# Patient Record
Sex: Female | Born: 1955 | Race: White | Hispanic: No | Marital: Single
Health system: Southern US, Community
[De-identification: ages and names within clinical notes are randomized; demographics above are authoritative.]

## PROBLEM LIST (undated history)

## (undated) DIAGNOSIS — I5181 Takotsubo syndrome: Secondary | ICD-10-CM

## (undated) DIAGNOSIS — E785 Hyperlipidemia, unspecified: Secondary | ICD-10-CM

## (undated) DIAGNOSIS — C3410 Malignant neoplasm of upper lobe, unspecified bronchus or lung: Secondary | ICD-10-CM

## (undated) DIAGNOSIS — Z8619 Personal history of other infectious and parasitic diseases: Secondary | ICD-10-CM

## (undated) DIAGNOSIS — J439 Emphysema, unspecified: Secondary | ICD-10-CM

## (undated) DIAGNOSIS — J189 Pneumonia, unspecified organism: Secondary | ICD-10-CM

## (undated) DIAGNOSIS — I428 Other cardiomyopathies: Secondary | ICD-10-CM

## (undated) DIAGNOSIS — R636 Underweight: Secondary | ICD-10-CM

## (undated) DIAGNOSIS — Z8709 Personal history of other diseases of the respiratory system: Secondary | ICD-10-CM

## (undated) DIAGNOSIS — J449 Chronic obstructive pulmonary disease, unspecified: Secondary | ICD-10-CM

## (undated) DIAGNOSIS — M810 Age-related osteoporosis without current pathological fracture: Secondary | ICD-10-CM

## (undated) HISTORY — DX: Other cardiomyopathies: I42.8

## (undated) HISTORY — DX: Malignant neoplasm of upper lobe, unspecified bronchus or lung: C34.10

## (undated) HISTORY — DX: Hyperlipidemia, unspecified: E78.5

---

## 2015-11-01 ENCOUNTER — Other Ambulatory Visit: Payer: Self-pay | Admitting: Obstetrics & Gynecology

## 2015-11-01 DIAGNOSIS — E2839 Other primary ovarian failure: Secondary | ICD-10-CM

## 2015-11-26 ENCOUNTER — Ambulatory Visit
Admission: RE | Admit: 2015-11-26 | Discharge: 2015-11-26 | Disposition: A | Discharge status: Home / Self Care | Payer: BLUE CROSS/BLUE SHIELD | Source: Ambulatory Visit | Attending: Obstetrics & Gynecology | Admitting: Obstetrics & Gynecology

## 2015-11-26 DIAGNOSIS — E2839 Other primary ovarian failure: Secondary | ICD-10-CM

## 2020-12-10 DIAGNOSIS — Z1231 Encounter for screening mammogram for malignant neoplasm of breast: Secondary | ICD-10-CM | POA: Diagnosis not present

## 2020-12-13 DIAGNOSIS — Z681 Body mass index (BMI) 19 or less, adult: Secondary | ICD-10-CM | POA: Diagnosis not present

## 2020-12-13 DIAGNOSIS — R5383 Other fatigue: Secondary | ICD-10-CM | POA: Diagnosis not present

## 2020-12-13 DIAGNOSIS — Z01419 Encounter for gynecological examination (general) (routine) without abnormal findings: Secondary | ICD-10-CM | POA: Diagnosis not present

## 2020-12-13 DIAGNOSIS — E7800 Pure hypercholesterolemia, unspecified: Secondary | ICD-10-CM | POA: Diagnosis not present

## 2020-12-13 DIAGNOSIS — Z124 Encounter for screening for malignant neoplasm of cervix: Secondary | ICD-10-CM | POA: Diagnosis not present

## 2020-12-27 DIAGNOSIS — Z1211 Encounter for screening for malignant neoplasm of colon: Secondary | ICD-10-CM | POA: Diagnosis not present

## 2021-01-01 LAB — COLOGUARD: COLOGUARD: NEGATIVE

## 2021-11-28 ENCOUNTER — Observation Stay (HOSPITAL_COMMUNITY): Payer: Medicare HMO

## 2021-11-28 ENCOUNTER — Other Ambulatory Visit: Payer: Self-pay

## 2021-11-28 ENCOUNTER — Emergency Department (HOSPITAL_COMMUNITY): Payer: Medicare HMO

## 2021-11-28 ENCOUNTER — Inpatient Hospital Stay (HOSPITAL_COMMUNITY)
Admission: EM | Admit: 2021-11-28 | Discharge: 2021-12-18 | DRG: 870 | Disposition: A | Discharge status: Rehabilitation Facility | Payer: Medicare HMO | Attending: Internal Medicine | Admitting: Internal Medicine

## 2021-11-28 ENCOUNTER — Encounter (HOSPITAL_COMMUNITY): Payer: Self-pay | Admitting: Emergency Medicine

## 2021-11-28 DIAGNOSIS — E440 Moderate protein-calorie malnutrition: Secondary | ICD-10-CM | POA: Insufficient documentation

## 2021-11-28 DIAGNOSIS — S0101XA Laceration without foreign body of scalp, initial encounter: Secondary | ICD-10-CM | POA: Diagnosis present

## 2021-11-28 DIAGNOSIS — L89151 Pressure ulcer of sacral region, stage 1: Secondary | ICD-10-CM | POA: Diagnosis not present

## 2021-11-28 DIAGNOSIS — Z79899 Other long term (current) drug therapy: Secondary | ICD-10-CM

## 2021-11-28 DIAGNOSIS — J439 Emphysema, unspecified: Secondary | ICD-10-CM | POA: Diagnosis present

## 2021-11-28 DIAGNOSIS — R197 Diarrhea, unspecified: Secondary | ICD-10-CM | POA: Diagnosis not present

## 2021-11-28 DIAGNOSIS — R413 Other amnesia: Secondary | ICD-10-CM | POA: Diagnosis not present

## 2021-11-28 DIAGNOSIS — E874 Mixed disorder of acid-base balance: Secondary | ICD-10-CM | POA: Diagnosis present

## 2021-11-28 DIAGNOSIS — Z978 Presence of other specified devices: Secondary | ICD-10-CM

## 2021-11-28 DIAGNOSIS — Z9181 History of falling: Secondary | ICD-10-CM

## 2021-11-28 DIAGNOSIS — E871 Hypo-osmolality and hyponatremia: Secondary | ICD-10-CM | POA: Diagnosis present

## 2021-11-28 DIAGNOSIS — F17210 Nicotine dependence, cigarettes, uncomplicated: Secondary | ICD-10-CM | POA: Diagnosis present

## 2021-11-28 DIAGNOSIS — I5021 Acute systolic (congestive) heart failure: Secondary | ICD-10-CM | POA: Diagnosis present

## 2021-11-28 DIAGNOSIS — J9691 Respiratory failure, unspecified with hypoxia: Secondary | ICD-10-CM

## 2021-11-28 DIAGNOSIS — G9341 Metabolic encephalopathy: Secondary | ICD-10-CM | POA: Diagnosis present

## 2021-11-28 DIAGNOSIS — J9601 Acute respiratory failure with hypoxia: Secondary | ICD-10-CM

## 2021-11-28 DIAGNOSIS — R531 Weakness: Secondary | ICD-10-CM | POA: Diagnosis not present

## 2021-11-28 DIAGNOSIS — R000 Tachycardia, unspecified: Secondary | ICD-10-CM | POA: Diagnosis not present

## 2021-11-28 DIAGNOSIS — R7989 Other specified abnormal findings of blood chemistry: Secondary | ICD-10-CM | POA: Diagnosis not present

## 2021-11-28 DIAGNOSIS — I517 Cardiomegaly: Secondary | ICD-10-CM | POA: Diagnosis not present

## 2021-11-28 DIAGNOSIS — R570 Cardiogenic shock: Secondary | ICD-10-CM | POA: Diagnosis not present

## 2021-11-28 DIAGNOSIS — I248 Other forms of acute ischemic heart disease: Secondary | ICD-10-CM | POA: Diagnosis present

## 2021-11-28 DIAGNOSIS — A481 Legionnaires' disease: Secondary | ICD-10-CM | POA: Diagnosis present

## 2021-11-28 DIAGNOSIS — R0600 Dyspnea, unspecified: Secondary | ICD-10-CM | POA: Diagnosis not present

## 2021-11-28 DIAGNOSIS — J811 Chronic pulmonary edema: Secondary | ICD-10-CM

## 2021-11-28 DIAGNOSIS — R54 Age-related physical debility: Secondary | ICD-10-CM | POA: Diagnosis present

## 2021-11-28 DIAGNOSIS — R0689 Other abnormalities of breathing: Secondary | ICD-10-CM | POA: Diagnosis not present

## 2021-11-28 DIAGNOSIS — Z885 Allergy status to narcotic agent status: Secondary | ICD-10-CM

## 2021-11-28 DIAGNOSIS — W06XXXA Fall from bed, initial encounter: Secondary | ICD-10-CM | POA: Diagnosis present

## 2021-11-28 DIAGNOSIS — R58 Hemorrhage, not elsewhere classified: Secondary | ICD-10-CM | POA: Diagnosis not present

## 2021-11-28 DIAGNOSIS — Z681 Body mass index (BMI) 19 or less, adult: Secondary | ICD-10-CM

## 2021-11-28 DIAGNOSIS — R051 Acute cough: Secondary | ICD-10-CM

## 2021-11-28 DIAGNOSIS — K7010 Alcoholic hepatitis without ascites: Secondary | ICD-10-CM | POA: Diagnosis present

## 2021-11-28 DIAGNOSIS — Z452 Encounter for adjustment and management of vascular access device: Secondary | ICD-10-CM

## 2021-11-28 DIAGNOSIS — J80 Acute respiratory distress syndrome: Secondary | ICD-10-CM | POA: Diagnosis not present

## 2021-11-28 DIAGNOSIS — R739 Hyperglycemia, unspecified: Secondary | ICD-10-CM | POA: Diagnosis present

## 2021-11-28 DIAGNOSIS — J9600 Acute respiratory failure, unspecified whether with hypoxia or hypercapnia: Secondary | ICD-10-CM

## 2021-11-28 DIAGNOSIS — D509 Iron deficiency anemia, unspecified: Secondary | ICD-10-CM | POA: Diagnosis present

## 2021-11-28 DIAGNOSIS — E860 Dehydration: Secondary | ICD-10-CM | POA: Diagnosis present

## 2021-11-28 DIAGNOSIS — R195 Other fecal abnormalities: Secondary | ICD-10-CM | POA: Diagnosis present

## 2021-11-28 DIAGNOSIS — A419 Sepsis, unspecified organism: Principal | ICD-10-CM

## 2021-11-28 DIAGNOSIS — L8990 Pressure ulcer of unspecified site, unspecified stage: Secondary | ICD-10-CM | POA: Insufficient documentation

## 2021-11-28 DIAGNOSIS — R059 Cough, unspecified: Secondary | ICD-10-CM | POA: Diagnosis not present

## 2021-11-28 DIAGNOSIS — R823 Hemoglobinuria: Secondary | ICD-10-CM | POA: Diagnosis present

## 2021-11-28 DIAGNOSIS — Z4659 Encounter for fitting and adjustment of other gastrointestinal appliance and device: Secondary | ICD-10-CM

## 2021-11-28 DIAGNOSIS — Z743 Need for continuous supervision: Secondary | ICD-10-CM | POA: Diagnosis not present

## 2021-11-28 DIAGNOSIS — N179 Acute kidney failure, unspecified: Secondary | ICD-10-CM | POA: Diagnosis present

## 2021-11-28 DIAGNOSIS — R1310 Dysphagia, unspecified: Secondary | ICD-10-CM | POA: Diagnosis not present

## 2021-11-28 DIAGNOSIS — Y92003 Bedroom of unspecified non-institutional (private) residence as the place of occurrence of the external cause: Secondary | ICD-10-CM

## 2021-11-28 DIAGNOSIS — J189 Pneumonia, unspecified organism: Secondary | ICD-10-CM

## 2021-11-28 DIAGNOSIS — R0602 Shortness of breath: Secondary | ICD-10-CM | POA: Diagnosis not present

## 2021-11-28 DIAGNOSIS — F1010 Alcohol abuse, uncomplicated: Secondary | ICD-10-CM | POA: Diagnosis present

## 2021-11-28 DIAGNOSIS — E876 Hypokalemia: Secondary | ICD-10-CM | POA: Diagnosis present

## 2021-11-28 DIAGNOSIS — Z20822 Contact with and (suspected) exposure to covid-19: Secondary | ICD-10-CM | POA: Diagnosis present

## 2021-11-28 DIAGNOSIS — I5181 Takotsubo syndrome: Secondary | ICD-10-CM

## 2021-11-28 DIAGNOSIS — S0181XA Laceration without foreign body of other part of head, initial encounter: Secondary | ICD-10-CM | POA: Diagnosis not present

## 2021-11-28 DIAGNOSIS — E43 Unspecified severe protein-calorie malnutrition: Secondary | ICD-10-CM | POA: Insufficient documentation

## 2021-11-28 DIAGNOSIS — D539 Nutritional anemia, unspecified: Secondary | ICD-10-CM | POA: Diagnosis present

## 2021-11-28 DIAGNOSIS — Z720 Tobacco use: Secondary | ICD-10-CM

## 2021-11-28 DIAGNOSIS — R6521 Severe sepsis with septic shock: Secondary | ICD-10-CM | POA: Diagnosis not present

## 2021-11-28 DIAGNOSIS — M810 Age-related osteoporosis without current pathological fracture: Secondary | ICD-10-CM | POA: Diagnosis present

## 2021-11-28 HISTORY — DX: Chronic obstructive pulmonary disease, unspecified: J44.9

## 2021-11-28 HISTORY — DX: Underweight: R63.6

## 2021-11-28 HISTORY — DX: Age-related osteoporosis without current pathological fracture: M81.0

## 2021-11-28 LAB — PROTIME-INR
INR: 1 (ref 0.8–1.2)
Prothrombin Time: 12.6 seconds (ref 11.4–15.2)

## 2021-11-28 LAB — CBC WITH DIFFERENTIAL/PLATELET
Abs Immature Granulocytes: 0.22 10*3/uL — ABNORMAL HIGH (ref 0.00–0.07)
Basophils Absolute: 0.1 10*3/uL (ref 0.0–0.1)
Basophils Relative: 0 %
Eosinophils Absolute: 0 10*3/uL (ref 0.0–0.5)
Eosinophils Relative: 0 %
HCT: 38.1 % (ref 36.0–46.0)
Hemoglobin: 13.7 g/dL (ref 12.0–15.0)
Immature Granulocytes: 2 %
Lymphocytes Relative: 2 %
Lymphs Abs: 0.3 10*3/uL — ABNORMAL LOW (ref 0.7–4.0)
MCH: 36.3 pg — ABNORMAL HIGH (ref 26.0–34.0)
MCHC: 36 g/dL (ref 30.0–36.0)
MCV: 101.1 fL — ABNORMAL HIGH (ref 80.0–100.0)
Monocytes Absolute: 0.5 10*3/uL (ref 0.1–1.0)
Monocytes Relative: 4 %
Neutro Abs: 12.3 10*3/uL — ABNORMAL HIGH (ref 1.7–7.7)
Neutrophils Relative %: 92 %
Platelets: 177 10*3/uL (ref 150–400)
RBC: 3.77 MIL/uL — ABNORMAL LOW (ref 3.87–5.11)
RDW: 12.8 % (ref 11.5–15.5)
WBC Morphology: INCREASED
WBC: 13.3 10*3/uL — ABNORMAL HIGH (ref 4.0–10.5)
nRBC: 0 % (ref 0.0–0.2)

## 2021-11-28 LAB — URINALYSIS, ROUTINE W REFLEX MICROSCOPIC
Bilirubin Urine: NEGATIVE
Glucose, UA: NEGATIVE mg/dL
Ketones, ur: 20 mg/dL — AB
Leukocytes,Ua: NEGATIVE
Nitrite: NEGATIVE
Protein, ur: 100 mg/dL — AB
Specific Gravity, Urine: 1.023 (ref 1.005–1.030)
pH: 6 (ref 5.0–8.0)

## 2021-11-28 LAB — BASIC METABOLIC PANEL
Anion gap: 15 (ref 5–15)
BUN: 11 mg/dL (ref 8–23)
CO2: 20 mmol/L — ABNORMAL LOW (ref 22–32)
Calcium: 7.7 mg/dL — ABNORMAL LOW (ref 8.9–10.3)
Chloride: 93 mmol/L — ABNORMAL LOW (ref 98–111)
Creatinine, Ser: 0.6 mg/dL (ref 0.44–1.00)
GFR, Estimated: >60 mL/min (ref >60)
Glucose, Bld: 119 mg/dL — ABNORMAL HIGH (ref 70–99)
Potassium: 2.7 mmol/L — CL (ref 3.5–5.1)
Sodium: 128 mmol/L — ABNORMAL LOW (ref 135–145)

## 2021-11-28 LAB — POC OCCULT BLOOD, ED: Fecal Occult Bld: POSITIVE — AB

## 2021-11-28 LAB — COMPREHENSIVE METABOLIC PANEL
ALT: 71 U/L — ABNORMAL HIGH (ref 0–44)
AST: 210 U/L — ABNORMAL HIGH (ref 15–41)
Albumin: 2.5 g/dL — ABNORMAL LOW (ref 3.5–5.0)
Alkaline Phosphatase: 49 U/L (ref 38–126)
Anion gap: 13 (ref 5–15)
BUN: 16 mg/dL (ref 8–23)
CO2: 22 mmol/L (ref 22–32)
Calcium: 8.2 mg/dL — ABNORMAL LOW (ref 8.9–10.3)
Chloride: 91 mmol/L — ABNORMAL LOW (ref 98–111)
Creatinine, Ser: 0.74 mg/dL (ref 0.44–1.00)
GFR, Estimated: >60 mL/min (ref >60)
Glucose, Bld: 159 mg/dL — ABNORMAL HIGH (ref 70–99)
Potassium: 3.5 mmol/L (ref 3.5–5.1)
Sodium: 126 mmol/L — ABNORMAL LOW (ref 135–145)
Total Bilirubin: 0.4 mg/dL (ref 0.3–1.2)
Total Protein: 6.1 g/dL — ABNORMAL LOW (ref 6.5–8.1)

## 2021-11-28 LAB — I-STAT VENOUS BLOOD GAS, ED
Acid-base deficit: 1 mmol/L (ref 0.0–2.0)
Bicarbonate: 22.5 mmol/L (ref 20.0–28.0)
Calcium, Ion: 1.01 mmol/L — ABNORMAL LOW (ref 1.15–1.40)
HCT: 41 % (ref 36.0–46.0)
Hemoglobin: 13.9 g/dL (ref 12.0–15.0)
O2 Saturation: 46 %
Potassium: 3.4 mmol/L — ABNORMAL LOW (ref 3.5–5.1)
Sodium: 125 mmol/L — ABNORMAL LOW (ref 135–145)
TCO2: 23 mmol/L (ref 22–32)
pCO2, Ven: 32.6 mmHg — ABNORMAL LOW (ref 44.0–60.0)
pH, Ven: 7.446 — ABNORMAL HIGH (ref 7.250–7.430)
pO2, Ven: 24 mmHg — CL (ref 32.0–45.0)

## 2021-11-28 LAB — TYPE AND SCREEN
ABO/RH(D): O POS
Antibody Screen: NEGATIVE

## 2021-11-28 LAB — GAMMA GT: GGT: 41 U/L (ref 7–50)

## 2021-11-28 LAB — HEPATITIS B SURFACE ANTIBODY,QUALITATIVE: Hep B S Ab: NONREACTIVE

## 2021-11-28 LAB — MAGNESIUM: Magnesium: 2.3 mg/dL (ref 1.7–2.4)

## 2021-11-28 LAB — CK: Total CK: 2245 U/L — ABNORMAL HIGH (ref 38–234)

## 2021-11-28 LAB — RESP PANEL BY RT-PCR (FLU A&B, COVID) ARPGX2
Influenza A by PCR: NEGATIVE
Influenza B by PCR: NEGATIVE
SARS Coronavirus 2 by RT PCR: NEGATIVE

## 2021-11-28 LAB — ABO/RH: ABO/RH(D): O POS

## 2021-11-28 LAB — HEMOGLOBIN A1C
Hgb A1c MFr Bld: 5.5 % (ref 4.8–5.6)
Mean Plasma Glucose: 111.15 mg/dL

## 2021-11-28 LAB — MRSA NEXT GEN BY PCR, NASAL: MRSA by PCR Next Gen: NOT DETECTED

## 2021-11-28 LAB — ETHANOL: Alcohol, Ethyl (B): <10 mg/dL (ref <10)

## 2021-11-28 LAB — HIV ANTIBODY (ROUTINE TESTING W REFLEX): HIV Screen 4th Generation wRfx: NONREACTIVE

## 2021-11-28 LAB — HEPATITIS B SURFACE ANTIGEN: Hepatitis B Surface Ag: NONREACTIVE

## 2021-11-28 LAB — HEPATITIS B CORE ANTIBODY, IGM: Hep B C IgM: NONREACTIVE

## 2021-11-28 LAB — LACTIC ACID, PLASMA
Lactic Acid, Venous: 3.1 mmol/L (ref 0.5–1.9)
Lactic Acid, Venous: 1.7 mmol/L (ref 0.5–1.9)

## 2021-11-28 LAB — APTT: aPTT: 32 seconds (ref 24–36)

## 2021-11-28 MED ORDER — THIAMINE HCL 100 MG PO TABS
100.0000 mg | ORAL_TABLET | Freq: Every day | ORAL | Status: DC
  Administered 2021-11-28 – 2021-11-29 (×2): 100 mg via ORAL
  Filled 2021-11-28 (×2): qty 1

## 2021-11-28 MED ORDER — LACTATED RINGERS IV BOLUS
500.0000 mL | Freq: Once | INTRAVENOUS | Status: AC
Start: 2021-11-28 — End: 2021-11-28
  Administered 2021-11-28: 500 mL via INTRAVENOUS

## 2021-11-28 MED ORDER — IPRATROPIUM-ALBUTEROL 0.5-2.5 (3) MG/3ML IN SOLN
3.0000 mL | Freq: Three times a day (TID) | RESPIRATORY_TRACT | Status: DC
  Administered 2021-11-29 – 2021-12-12 (×38): 3 mL via RESPIRATORY_TRACT
  Filled 2021-11-28 (×37): qty 3

## 2021-11-28 MED ORDER — FOLIC ACID 1 MG PO TABS
1.0000 mg | ORAL_TABLET | Freq: Every day | ORAL | Status: DC
  Administered 2021-11-28 – 2021-11-30 (×3): 1 mg via ORAL
  Filled 2021-11-28 (×3): qty 1

## 2021-11-28 MED ORDER — LACTATED RINGERS IV BOLUS
1000.0000 mL | Freq: Once | INTRAVENOUS | Status: AC
Start: 2021-11-28 — End: 2021-11-28
  Administered 2021-11-28: 1000 mL via INTRAVENOUS

## 2021-11-28 MED ORDER — CHLORHEXIDINE GLUCONATE CLOTH 2 % EX PADS
6.0000 | MEDICATED_PAD | Freq: Every day | CUTANEOUS | Status: DC
  Administered 2021-11-29 – 2021-12-18 (×21): 6 via TOPICAL

## 2021-11-28 MED ORDER — IPRATROPIUM-ALBUTEROL 0.5-2.5 (3) MG/3ML IN SOLN
3.0000 mL | RESPIRATORY_TRACT | Status: DC | PRN
  Administered 2021-12-09: 3 mL via RESPIRATORY_TRACT
  Filled 2021-11-28 (×3): qty 3

## 2021-11-28 MED ORDER — LACTATED RINGERS IV BOLUS
1000.0000 mL | Freq: Once | INTRAVENOUS | Status: AC
  Administered 2021-11-28: 1000 mL via INTRAVENOUS

## 2021-11-28 MED ORDER — ADULT MULTIVITAMIN W/MINERALS CH
1.0000 | ORAL_TABLET | Freq: Every day | ORAL | Status: DC
  Administered 2021-11-28 – 2021-11-30 (×3): 1 via ORAL
  Filled 2021-11-28 (×3): qty 1

## 2021-11-28 MED ORDER — IPRATROPIUM-ALBUTEROL 0.5-2.5 (3) MG/3ML IN SOLN
RESPIRATORY_TRACT | Status: AC
  Administered 2021-11-28: 3 mL via RESPIRATORY_TRACT
  Filled 2021-11-28: qty 3

## 2021-11-28 MED ORDER — POTASSIUM CHLORIDE 10 MEQ/100ML IV SOLN
10.0000 meq | INTRAVENOUS | Status: AC
  Administered 2021-11-28 – 2021-11-29 (×4): 10 meq via INTRAVENOUS
  Filled 2021-11-28 (×6): qty 100

## 2021-11-28 MED ORDER — VANCOMYCIN HCL IN DEXTROSE 1-5 GM/200ML-% IV SOLN
1000.0000 mg | Freq: Once | INTRAVENOUS | Status: AC
  Administered 2021-11-28: 1000 mg via INTRAVENOUS
  Filled 2021-11-28: qty 200

## 2021-11-28 MED ORDER — NICOTINE 21 MG/24HR TD PT24
21.0000 mg | MEDICATED_PATCH | Freq: Every day | TRANSDERMAL | Status: DC
  Administered 2021-11-28 – 2021-12-18 (×21): 21 mg via TRANSDERMAL
  Filled 2021-11-28 (×21): qty 1

## 2021-11-28 MED ORDER — ORAL CARE MOUTH RINSE
15.0000 mL | Freq: Two times a day (BID) | OROMUCOSAL | Status: DC
  Administered 2021-11-29 – 2021-11-30 (×4): 15 mL via OROMUCOSAL

## 2021-11-28 MED ORDER — SODIUM CHLORIDE 0.9 % IV SOLN
500.0000 mg | INTRAVENOUS | Status: DC
  Administered 2021-11-28 – 2021-12-01 (×4): 500 mg via INTRAVENOUS
  Filled 2021-11-28 (×5): qty 5

## 2021-11-28 MED ORDER — POTASSIUM CHLORIDE 20 MEQ PO PACK
40.0000 meq | PACK | Freq: Three times a day (TID) | ORAL | Status: AC
  Administered 2021-11-28 – 2021-11-29 (×3): 40 meq via ORAL
  Filled 2021-11-28 (×3): qty 2

## 2021-11-28 MED ORDER — SODIUM CHLORIDE 0.9 % IV SOLN
2.0000 g | INTRAVENOUS | Status: DC
  Administered 2021-11-28 – 2021-12-01 (×4): 2 g via INTRAVENOUS
  Filled 2021-11-28 (×4): qty 20

## 2021-11-28 MED ORDER — CHLORHEXIDINE GLUCONATE 0.12 % MT SOLN
15.0000 mL | Freq: Two times a day (BID) | OROMUCOSAL | Status: DC
  Administered 2021-11-29 – 2021-12-03 (×7): 15 mL via OROMUCOSAL
  Filled 2021-11-28 (×6): qty 15

## 2021-11-28 MED ORDER — SODIUM CHLORIDE 0.9 % IV SOLN
2.0000 g | Freq: Once | INTRAVENOUS | Status: AC
  Administered 2021-11-28: 2 g via INTRAVENOUS
  Filled 2021-11-28: qty 2

## 2021-11-28 MED ORDER — ACETAMINOPHEN 325 MG PO TABS
650.0000 mg | ORAL_TABLET | Freq: Once | ORAL | Status: AC
  Administered 2021-11-28: 650 mg via ORAL
  Filled 2021-11-28: qty 2

## 2021-11-28 MED ORDER — ENOXAPARIN SODIUM 30 MG/0.3ML IJ SOSY
30.0000 mg | PREFILLED_SYRINGE | INTRAMUSCULAR | Status: DC
  Administered 2021-11-28 – 2021-12-02 (×5): 30 mg via SUBCUTANEOUS
  Filled 2021-11-28 (×5): qty 0.3

## 2021-11-28 MED ORDER — ACETAMINOPHEN 325 MG PO TABS
650.0000 mg | ORAL_TABLET | Freq: Four times a day (QID) | ORAL | Status: DC | PRN
  Administered 2021-11-29 – 2021-11-30 (×4): 650 mg via ORAL
  Filled 2021-11-28 (×5): qty 2

## 2021-11-28 MED ORDER — THIAMINE HCL 100 MG/ML IJ SOLN
100.0000 mg | Freq: Every day | INTRAMUSCULAR | Status: DC
  Administered 2021-11-30: 100 mg via INTRAVENOUS
  Filled 2021-11-28: qty 2

## 2021-11-28 NOTE — Progress Notes (Incomplete)
° °  Subjective: No acute overnight events.   Patient was seen at bedside during rounds today. Pt reports feeling ***. Pt complains of ***. Pt denies ***.   Pt is updated on the plan for today, and all questions and concerns are addressed.   Objective:  Vital signs in last 24 hours: Vitals:   11/28/21 0945 11/28/21 1045 11/28/21 1100 11/28/21 1101  BP: 134/74 (!) 107/53 105/61   Pulse: (!) 123 (!) 114 (!) 106   Resp: 19 (!) 23 15   Temp:    100.2 F (37.9 C)  TempSrc:    Oral  SpO2: 95% 93% 94%   Weight:      Height:       General: chronically ill, frail appearing female in no distress Cardiac: tachycardic rate ***, regular rhythm, no LE edema Pulm: breathing comfortably at rest on 4L *** supplemental oxygen. Diminished lung sounds throughout, no wheezing or rhonchi *** GI: abdomen soft, non-tender, non-distended Neuro: a/o x4, follows commands  MSK: catechetic appearing with diffuse muscle atrophy Skin: no jaundice. Scattered telangiectasias and ecchymosis.   Assessment/Plan:  Principal Problem:   Sepsis ([place])  Sepsis 2/2 lobar PNA  Acute hypoxic respiratory failure  Asymptomatic bacteria  Temp, HR, WBC, Lactate BP  S/p 1.5L IVF in ED.  Denies any urinary symptoms.  Received vancomycin and cefepime in the ED; transitioned to rocephin and azithromycin. MRSA swab ***.  Urine legionella Strep  BB with NGTD  Head laceration  In setting of fall 2/2 weakness in s/o acute illness. CTH negative. S/p laceration repair by EDP. No active bleeding.  - PT/OT following   Hemoglobinuria  - CK ***  Asymptomatic Hyponatremia Unknown chronicity. Na 126 on admission -> *** this AM.   Hyperglycemia  Glucose 159 on admission. A1c ***  Hepatocellular transaminitis AST/ALT 210/71 on admission consistent with alcoholic hepatitis. AST/ALT *** this AM. CK obtained to rule out rhabdo given hemoglobinuria and transaminitis is ***. EtOH level ***, GGT ***, and viral hepatitis B/C  panel ***.  - RUQ given elevated LFTs *** - Trend LFTs  Positive stool occult Positive on admission. Hgb stable this admission ***.  No prior colonoscopy. - Trend CBC and tranfuse for Hgb <7 or hemodynamic instability - Outpatient GI follow up for colonoscopy ***  TUD - Nicotine patch   Underweight  Dietician consulted for nutritional assessment   Best Practice: Diet: Normal IVF: None,None VTE: Enoxaparin Code: Full   Lajean Manes, MD  Internal Medicine Resident, PGY-1 Pager: [phone] After 5pm on weekdays and 1pm on weekends: On Call pager [phone]

## 2021-11-28 NOTE — ED Notes (Signed)
Placed pt on NRB. I called RT to ask to take a look at pt and suggestions.

## 2021-11-28 NOTE — Progress Notes (Signed)
Lab here and drew pt blood and cxr completed at bedside two times to get clear picture. Pt fiance at bedside

## 2021-11-28 NOTE — Progress Notes (Signed)
Admitted to rm 7 via bed accompanied by fiance , alert orient x 4 . 02 hi flo 15 liters

## 2021-11-28 NOTE — ED Triage Notes (Signed)
Pt arrives via EMS for sickness x1- cough, decreased appetite, generalized weakness. Diarrhea for 3-4 days- dark color. Pt has had multiple falls due to weakness. Today she fell and hit the back of her head, no LOC. But pt does have bloody lac to back of her head. EMS gave 1 L of LR. HR 120, BP 110/70, room air sats 92%- improved to 96% with 4L. EMS reports right lung sounds very "junky." Denies SOB

## 2021-11-28 NOTE — Consult Note (Signed)
NAME:  Gina Cherry, MRN:  [id number], DOB:  01/14/1956, LOS: 0 ADMISSION DATE:  11/28/2021, CONSULTATION DATE:  11/28/21 REFERRING MD:  Earlie Raveling, CHIEF COMPLAINT:   worsening hypoxia/dyspnea  History of Present Illness:  Gina Cherry is a 66 y.o. F with PMH of tobacco and ETOH use who presented with 5-6 days of fatigue and poor appetite and diarrhea with mild coughing.  She tried to get out of bed and fell, hitting her head which prompted her significant other to call EMS.  She was found to have bilateral pneumonia with WBC 13k and mildly elevated LFT' s along with hypokalemia.  She was admitted to the floor and given 3L IVF, she initially required 4L [place].   Her oxygen requirement gradually increased and she was transitioned to maximal HFNC, therefore PCCM consulted.   Pt states that she feels better tonight, though is having to sit forward to breath comfortably.  She has been smoking 1/2 ppd for 40+ years, no diagnosis of emphysema or COPD and denies shortness of breath on a normal basis. Repeat CXR with mildly progressing bilateral opacities.   Pertinent  Medical History  Tobacco and ETOH use    Significant Hospital Events: Including procedures, antibiotic start and stop dates in addition to other pertinent events   2/13 admitted to teaching service, later required HFNC and ICU trxfr, On Vanc/Ceftriaxone/Azithromycin  Interim History / Subjective:  Pt on 35L 100%, plan to transfer to intensive care for monitoring.   Objective   Blood pressure 128/66, pulse (!) 124, temperature (!) 100.4 F (38 C), temperature source Oral, resp. rate (!) 22, height 5\' 4"  (1.626 m), weight 45.4 kg, SpO2 92 %.    FiO2 (%):  [100 %] 100 %   Intake/Output Summary (Last 24 hours) at 11/28/2021 2235 Last data filed at 11/28/2021 1930 Gross per 24 hour  Intake 3791.25 ml  Output --  Net 3791.25 ml   Filed Weights   11/28/21 0847  Weight: 45.4 kg    General:  very thin F, awake, sitting  forward and mildly tachypneic  in no severe distress HEENT: MM pink/moist, sclera anicteric  Neuro: awake and alert, oriented and conversational, appears slightly anxious/tremulous CV: s1s2 tachycardic, regular, no m/r/g PULM:  decreased air entry bilateral bases without significant rhonchi or wheezing GI: soft, non-tender Extremities: warm/dry, no edema  Skin: no rashes or lesions   Resolved Hospital Problem list     Assessment & Plan:   Acute Hypoxic Respiratory Failure secondary to bilateral PNA Long term tobacco use On maximal HFNC,  Suspect may have increasing effusion after 3L IVF -transfer to ICU for close monitoring, continue HFNC -discussed if continues to worsen would need intubation, pt would want ventilator support -monitor UOP -check Echo -continue Vanc plus CAP coverage  -follow blood cultures, urine strep and legionella are pending -IS, nebulizers prn    Elevated LFT's Alcohol Use Acute hepatitis panel neg  -follow LFT's -monitor for signs of withdrawal -thiamine, folate, MV  Hypokalemia -check magnesium level and continue to replete as needed  Best Practice (right click and "Reselect all SmartList Selections" daily)   Diet/type: Regular consistency (see orders) DVT prophylaxis: LMWH GI prophylaxis: N/A Lines: N/A Foley:  N/A Code Status:  full code Last date of multidisciplinary goals of care discussion [Discussed intubation with patient and SO, would want full code]  Labs   CBC: Recent Labs  Lab 11/28/21 0901 11/28/21 0914  WBC 13.3*  --   NEUTROABS 12.3*  --  HGB 13.7 13.9  HCT 38.1 41.0  MCV 101.1*  --   PLT 177  --     Basic Metabolic Panel: Recent Labs  Lab 11/28/21 0901 11/28/21 0914 11/28/21 2125  NA 126* 125* 128*  K 3.5 3.4* 2.7*  CL 91*  --  93*  CO2 22  --  20*  GLUCOSE 159*  --  119*  BUN 16  --  11  CREATININE 0.74  --  0.60  CALCIUM 8.2*  --  7.7*  MG 2.3  --   --    GFR: Estimated Creatinine Clearance:  49.6 mL/min (by C-G formula based on SCr of 0.6 mg/dL). Recent Labs  Lab 11/28/21 0901 11/28/21 0916 11/28/21 1055  WBC 13.3*  --   --   LATICACIDVEN  --  3.1* 1.7    Liver Function Tests: Recent Labs  Lab 11/28/21 0901  AST 210*  ALT 71*  ALKPHOS 49  BILITOT 0.4  PROT 6.1*  ALBUMIN 2.5*   No results for input(s): LIPASE, AMYLASE in the last 168 hours. No results for input(s): AMMONIA in the last 168 hours.  ABG    Component Value Date/Time   HCO3 22.5 11/28/2021 0914   TCO2 23 11/28/2021 0914   ACIDBASEDEF 1.0 11/28/2021 0914   O2SAT 46.0 11/28/2021 0914     Coagulation Profile: Recent Labs  Lab 11/28/21 0948  INR 1.0    Cardiac Enzymes: Recent Labs  Lab 11/28/21 1400  CKTOTAL 2,245*    HbA1C: Hgb A1c MFr Bld  Date/Time Value Ref Range Status  11/28/2021 02:00 PM 5.5 4.8 - 5.6 % Final    Comment:    (NOTE) Pre diabetes:          5.7%-6.4%  Diabetes:              >6.4%  Glycemic control for   <7.0% adults with diabetes     CBG: No results for input(s): GLUCAP in the last 168 hours.  Review of Systems:   Please see the history of present illness. All other systems reviewed and are negative    Past Medical History:  She,  has no past medical history on file.   Surgical History:  History reviewed. No pertinent surgical history.   Social History:   reports that she has been smoking cigarettes. She has been smoking an average of .5 packs per day. She does not have any smokeless tobacco history on file. She reports current alcohol use. She reports that she does not use drugs.   Family History:  Her family history is not on file.   Allergies Allergies  Allergen Reactions   Codeine Other (See Comments)    Unknown reaction (possibly sick stomach)     Home Medications  Prior to Admission medications   Medication Sig Start Date End Date Taking? Authorizing Provider  acetaminophen (TYLENOL) 500 MG tablet Take 500 mg by mouth every 6 (six)  hours as needed for headache (pain).   Yes [provider]  alendronate (FOSAMAX) 70 MG tablet Take 70 mg by mouth every Saturday. 09/28/21  Yes [provider]  Cholecalciferol (VITAMIN D3) 50 MCG (2000 UT) TABS Take 2,000 Units by mouth at bedtime.   Yes [provider]  ibuprofen (ADVIL) 200 MG tablet Take 200 mg by mouth every 6 (six) hours as needed for headache (pain).   Yes [provider]  Multiple Vitamin (MULTIVITAMIN WITH MINERALS) TABS tablet Take 1 tablet by mouth at bedtime.   Yes  [provider]  Pramoxine-HC (HYDROCORTISONE ACE-PRAMOXINE) 2.5-1 % CREA Apply 1 application topically 2 (two) times daily as needed (hemorrhoids). 08/29/21  Yes [provider]     Critical care time: 45 minutes    CRITICAL CARE Performed by: Darcella Gasman Yakelin Grenier   Total critical care time: 45 minutes  Critical care time was exclusive of separately billable procedures and treating other patients.  Critical care was necessary to treat or prevent imminent or life-threatening deterioration.  Critical care was time spent personally by me on the following activities: development of treatment plan with patient and/or surrogate as well as nursing, discussions with consultants, evaluation of patient's response to treatment, examination of patient, obtaining history from patient or surrogate, ordering and performing treatments and interventions, ordering and review of laboratory studies, ordering and review of radiographic studies, pulse oximetry and re-evaluation of patient's condition.    Darcella Gasman Rasheem Figiel, PA-C [facility] See Amion for pager If no response to pager , please call [phone] until 7pm After 7:00 pm call Elink  [phone]

## 2021-11-28 NOTE — ED Provider Notes (Signed)
[facility] EMERGENCY DEPARTMENT Provider Note   CSN: [id number] Arrival date & time: 11/28/21  [id number]     History  Chief Complaint  Patient presents with   Diarrhea        Cough   Weakness    Gina Cherry is a 66 y.o. female.  HPI  Patient is a 66 year old female who presents today with 1 week of illness.  She states she has had a cough, decreased appetite, and generalized weakness.  For the last 3 to 4 days she has been having diarrhea which she states is becoming darker in color.  She also has had multiple falls secondary to weakness.  She fell and hit the back of her head today.  No loss of consciousness.  She did sustain a laceration and has a mild headache.  She reports feeling short of breath.  She is requiring oxygen.     Home Medications Prior to Admission medications   Medication Sig Start Date End Date Taking? Authorizing Provider  acetaminophen (TYLENOL) 500 MG tablet Take 500 mg by mouth every 6 (six) hours as needed for headache (pain).   Yes [provider]  alendronate (FOSAMAX) 70 MG tablet Take 70 mg by mouth every Saturday. 09/28/21  Yes [provider]  Cholecalciferol (VITAMIN D3) 50 MCG (2000 UT) TABS Take 2,000 Units by mouth at bedtime.   Yes [provider]  ibuprofen (ADVIL) 200 MG tablet Take 200 mg by mouth every 6 (six) hours as needed for headache (pain).   Yes [provider]  Multiple Vitamin (MULTIVITAMIN WITH MINERALS) TABS tablet Take 1 tablet by mouth at bedtime.   Yes [provider]  Pramoxine-HC (HYDROCORTISONE ACE-PRAMOXINE) 2.5-1 % CREA Apply 1 application topically 2 (two) times daily as needed (hemorrhoids). 08/29/21  Yes [provider]      Allergies    Codeine    Review of Systems   Review of Systems  Constitutional:  Positive for activity change, chills, fatigue and fever.  HENT:  Negative for ear pain and sore throat.   Eyes:  Negative for pain and  visual disturbance.  Respiratory:  Positive for cough, shortness of breath and wheezing.   Cardiovascular:  Negative for chest pain and palpitations.  Gastrointestinal:  Positive for diarrhea. Negative for abdominal pain and vomiting.  Genitourinary:  Negative for dysuria and hematuria.  Musculoskeletal:  Negative for arthralgias and back pain.  Skin:  Negative for color change and rash.  Neurological:  Positive for syncope and weakness. Negative for seizures.  All other systems reviewed and are negative.  Physical Exam Updated Vital Signs BP (!) 124/94    Pulse (!) 140    Temp 100.2 F (37.9 C) (Oral)    Resp (!) 25    Ht 5\' 4"  (1.626 m)    Wt 45.4 kg    SpO2 94%    BMI 17.16 kg/m  Physical Exam Vitals and nursing note reviewed.  Constitutional:      General: She is in acute distress.     Appearance: She is well-developed. She is ill-appearing and toxic-appearing.  HENT:     Head: Normocephalic and atraumatic.  Eyes:     Conjunctiva/sclera: Conjunctivae normal.  Cardiovascular:     Rate and Rhythm: Regular rhythm. Tachycardia present.     Heart sounds: No murmur heard. Pulmonary:     Effort: No respiratory distress.     Breath sounds: Rhonchi (mostly rightsided) present.     Comments: Tachypnea.  New oxygen requirement.  Abdominal:     Palpations: Abdomen is soft.     Tenderness: There is no abdominal tenderness.  Musculoskeletal:        General: No swelling.     Cervical back: Neck supple.  Skin:    General: Skin is warm and dry.     Capillary Refill: Capillary refill takes less than 2 seconds.  Neurological:     Mental Status: She is alert and oriented to person, place, and time.     Cranial Nerves: No cranial nerve deficit.  Psychiatric:        Mood and Affect: Mood normal.    ED Results / Procedures / Treatments   Labs (all labs ordered are listed, but only abnormal results are displayed) Labs Reviewed  CBC WITH DIFFERENTIAL/PLATELET - Abnormal; Notable for the  following components:      Result Value   WBC 13.3 (*)    RBC 3.77 (*)    MCV 101.1 (*)    MCH 36.3 (*)    Neutro Abs 12.3 (*)    Lymphs Abs 0.3 (*)    Abs Immature Granulocytes 0.22 (*)    All other components within normal limits  COMPREHENSIVE METABOLIC PANEL - Abnormal; Notable for the following components:   Sodium 126 (*)    Chloride 91 (*)    Glucose, Bld 159 (*)    Calcium 8.2 (*)    Total Protein 6.1 (*)    Albumin 2.5 (*)    AST 210 (*)    ALT 71 (*)    All other components within normal limits  LACTIC ACID, PLASMA - Abnormal; Notable for the following components:   Lactic Acid, Venous 3.1 (*)    All other components within normal limits  URINALYSIS, ROUTINE W REFLEX MICROSCOPIC - Abnormal; Notable for the following components:   APPearance HAZY (*)    Hgb urine dipstick MODERATE (*)    Ketones, ur 20 (*)    Protein, ur 100 (*)    Bacteria, UA RARE (*)    All other components within normal limits  CK - Abnormal; Notable for the following components:   Total CK 2,245 (*)    All other components within normal limits  POC OCCULT BLOOD, ED - Abnormal; Notable for the following components:   Fecal Occult Bld POSITIVE (*)    All other components within normal limits  I-STAT VENOUS BLOOD GAS, ED - Abnormal; Notable for the following components:   pH, Ven 7.446 (*)    pCO2, Ven 32.6 (*)    pO2, Ven 24.0 (*)    Sodium 125 (*)    Potassium 3.4 (*)    Calcium, Ion 1.01 (*)    All other components within normal limits  RESP PANEL BY RT-PCR (FLU A&B, COVID) ARPGX2  CULTURE, BLOOD (ROUTINE X 2)  CULTURE, BLOOD (ROUTINE X 2)  MRSA NEXT GEN BY PCR, NASAL  LACTIC ACID, PLASMA  MAGNESIUM  PROTIME-INR  APTT  GAMMA GT  HEMOGLOBIN A1C  HCV AB W REFLEX TO QUANT PCR  HEPATITIS B CORE ANTIBODY, IGM  HEPATITIS B SURFACE ANTIBODY,QUALITATIVE  HEPATITIS B SURFACE ANTIGEN  HIV ANTIBODY (ROUTINE TESTING W REFLEX)  STREP PNEUMONIAE URINARY ANTIGEN  LEGIONELLA PNEUMOPHILA  SEROGP 1 UR AG  ETHANOL  TYPE AND SCREEN  ABO/RH    EKG EKG Interpretation  Date/Time:  Monday November 28 2021 08:56:31 EST Ventricular Rate:  142 PR Interval:  127 QRS Duration: 129 QT Interval:  307 QTC Calculation:  436 R Axis:   176 Text Interpretation: Sinus tachycardia Premature ventricular complexes Right bundle branch block No old tracing to compare Confirmed by Dorie Rank [phone]) on 11/28/2021 9:04:28 AM  Radiology CT Head Wo Contrast  Result Date: 11/28/2021 CLINICAL DATA:  Memory loss. Fall with laceration to the back of the head. EXAM: CT HEAD WITHOUT CONTRAST TECHNIQUE: Contiguous axial images were obtained from the base of the skull through the vertex without intravenous contrast. RADIATION DOSE REDUCTION: This exam was performed according to the departmental dose-optimization program which includes automated exposure control, adjustment of the mA and/or kV according to patient size and/or use of iterative reconstruction technique. COMPARISON:  None. FINDINGS: Brain: The brainstem, cerebellum, cerebral peduncles, thalami, basal ganglia, basilar cisterns, and ventricular system appear within normal limits. No intracranial hemorrhage, mass lesion, or acute CVA. Vascular: There is atherosclerotic calcification of the cavernous carotid arteries bilaterally. Skull: Incidental arachnoid granulation in the right occipital bone. No acute calvarial findings. Sinuses/Orbits: Unremarkable Other: No supplemental non-categorized findings. IMPRESSION: 1. No acute intracranial findings. 2. Atherosclerosis. Electronically Signed   By: Van Clines M.D.   On: 11/28/2021 10:27   DG Chest Portable 1 View  Result Date: 11/28/2021 CLINICAL DATA:  Cough and shortness of breath. EXAM: PORTABLE CHEST 1 VIEW COMPARISON:  0928 hours FINDINGS: There is focal airspace disease in the right lung base compatible with pneumonia. Left lung clear. The cardio pericardial silhouette is enlarged. The  visualized bony structures of the thorax show no acute abnormality. Telemetry leads overlie the chest. IMPRESSION: Focal airspace opacity at the right lung base compatible with pneumonia. Follow-up imaging recommended to ensure resolution. Electronically Signed   By: Misty Stanley M.D.   On: 11/28/2021 09:45    Procedures .Marland KitchenLaceration Repair  Date/Time: 11/28/2021 2:58 PM Performed by: Jacelyn Pi, MD Authorized by: Dorie Rank, MD   Consent:    Consent obtained:  Verbal   Consent given by:  Patient   Risks discussed:  Infection and retained foreign body Universal protocol:    Procedure explained and questions answered to patient or proxy's satisfaction: yes     Patient identity confirmed:  Verbally with patient Anesthesia:    Anesthesia method:  None Laceration details:    Length (cm):  2 Pre-procedure details:    Preparation:  Patient was prepped and draped in usual sterile fashion and imaging obtained to evaluate for foreign bodies Exploration:    Limited defect created (wound extended): no   Treatment:    Irrigation solution:  Sterile saline and sterile water   Irrigation method:  Syringe   Debridement:  None Skin repair:    Repair method:  Staples   Number of staples:  2 Approximation:    Approximation:  Close Repair type:    Repair type:  Intermediate Post-procedure details:    Dressing:  Open (no dressing)   Procedure completion:  Tolerated    Medications Ordered in ED Medications  enoxaparin (LOVENOX) injection 30 mg (30 mg Subcutaneous Given 11/28/21 1358)  cefTRIAXone (ROCEPHIN) 2 g in sodium chloride 0.9 % 100 mL IVPB (has no administration in time range)  azithromycin (ZITHROMAX) 500 mg in sodium chloride 0.9 % 250 mL IVPB (500 mg Intravenous New Bag/Given 11/28/21 1402)  nicotine (NICODERM CQ - dosed in mg/24 hours) patch 21 mg (21 mg Transdermal Patch Applied [id number])  folic acid (FOLVITE) tablet 1 mg (has no administration in time range)   multivitamin with minerals tablet 1 tablet (has no administration in time range)  thiamine tablet 100 mg (has no administration in time range)    Or  thiamine (B-1) injection 100 mg (has no administration in time range)  lactated ringers bolus 1,000 mL (0 mLs Intravenous Stopped 11/28/21 1147)  acetaminophen (TYLENOL) tablet 650 mg (650 mg Oral Given 11/28/21 0948)  vancomycin (VANCOCIN) IVPB 1000 mg/200 mL premix (0 mg Intravenous Stopped 11/28/21 1059)  ceFEPIme (MAXIPIME) 2 g in sodium chloride 0.9 % 100 mL IVPB (0 g Intravenous Stopped 11/28/21 1045)  lactated ringers bolus 500 mL (0 mLs Intravenous Stopped 11/28/21 1146)    ED Course/ Medical Decision Making/ A&P  KEARIA FIRST presented today with fever, cough.  Differential diagnosis includes but is not limited to sepsis, pneumonia, UTI, viral illness  I spoke to family at the bedside who report patient with worsening symptoms for one week.  I spoke to EMS on arrival. Started on oxygen en route.  Based on the presentation, labs and imaging were ordered.    ED provider interpretation of laboratory studies: Leukocytosis.  Elevated lactic acid.  Mild hyponatremia.  Mild hypochloremia.  Elevated liver function test.  ED provider interpretation of imaging studies (imaging also reviewed and interpreted by radiology): CT head is negative.  Chest x-ray showed right-sided pneumonia  ED provider interpretation of EKG: No STEMI.  No acute ischemia.  Sinus tachycardia.  Decision Making: Patient presented with concern for sepsis.  She was tachycardic and febrile on arrival.  She was not hypotensive.  She was found to have a pneumonia.  She was started on vancomycin and cefepime.  30 cc/kg fluid bolus was given.  She was on new oxygen requirement here as well requiring 4 L.  Patient to be admitted to the hospital for sepsis likely secondary to pneumonia.  No septic shock present.  Discussed with the admitting team.  Accepted for  admission.  Patient also with quite positive stool here.  No signs of anemia or need for transfusion.  Discussed this with the admitting team.   Patient seen in conjunction with my attending, Dr. Tomi Bamberger.     Final Clinical Impression(s) / ED Diagnoses Final diagnoses:  Diarrhea, unspecified type  Acute cough  Weakness  Community acquired pneumonia of right lung, unspecified part of lung  Sepsis, due to unspecified organism, unspecified whether acute organ dysfunction present [facility])    Rx / DC Orders ED Discharge Orders     None         Jacelyn Pi, MD 11/28/21 1503    Dorie Rank, MD 11/28/21 1553

## 2021-11-28 NOTE — Progress Notes (Signed)
Moved to 4 north 17 via bed with respiratory therapist on non rebreather mask 100 percent

## 2021-11-28 NOTE — Progress Notes (Signed)
Received page from nurse stating that patient's O2 saturations continues to drop.  When I went to examine the patient, she was in no acute respiratory distress.  Husband present at bedside.  Patient is a mouth breather and once she began taking deep breaths through her nose via nasal cannula O2 saturation improved >94%.  Patient was offered to use the nonrebreather, patient declined and would prefer the nasal cannula.  Patient was encouraged to breathe through her nose and out through her mouth.  Patient acknowledges and agrees.  Patient was also noted to be tachycardic up to the 140s.  Heart rate was manually assessed, 110s.  Will repeat EKG.  Otherwise patient is asymptomatic, denies chest pain, worsening of her shortness of breath, palpitations, changes in vision or headache.  Patient was sitting comfortably in bed, and husband was providing dinner.  Patient able to tolerate food and drink.

## 2021-11-28 NOTE — Progress Notes (Signed)
°   11/28/21 2152  Therapy Vitals  Pulse Rate (!) 123  Resp (!) 22  Patient Position (if appropriate) Lying  MEWS Score/Color  MEWS Score 5  MEWS Score Color Red  Oxygen Therapy/Pulse Ox  O2 Device HFNC  $ High Flow Nasal Cannula  Yes  O2 Therapy Oxygen humidified  O2 Flow Rate (L/min) 35 L/min  FiO2 (%) 100 %  SpO2 96 %   Placed pt. On HHFNC 100% 35L due to pt. Saturation in the high 80s and WOB.

## 2021-11-28 NOTE — ED Notes (Signed)
Room air sats 89% improved to 94% on 4L [place]

## 2021-11-28 NOTE — ED Notes (Signed)
Pt was incontinent of BM, cleaned pt and applied clean pads.

## 2021-11-28 NOTE — Progress Notes (Signed)
°   11/28/21 2300  Therapy Vitals  Pulse Rate (!) 113  Resp (!) 24  BP 100/65  Patient Position (if appropriate) Sitting  MEWS Score/Color  MEWS Score 4  MEWS Score Color Red  Oxygen Therapy/Pulse Ox  O2 Device HFNC  O2 Therapy Oxygen humidified  O2 Flow Rate (L/min) 35 L/min  FiO2 (%) 100 %  SpO2 92 %   Transported pt. From 5W to 4N via NRB with no incident placed pt. Back on HHFNC 100% 35L.

## 2021-11-28 NOTE — H&P (Addendum)
NAME:  Gina Cherry, MRN:  [id number], DOB:  07-03-1956, LOS: 0 ADMISSION DATE:  11/28/2021, Primary: No primary care provider on file.  CHIEF COMPLAINT:  cough, shortness of breath, weakness   Medical Service: Internal Medicine Teaching Service         Attending Physician: Dr. Aldine Contes, MD    First Contact: Dr. Posey Pronto Pager: [phone]  Second Contact: Dr. Eulas Post Pager: [phone]       After Hours (After 5p/  First Contact Pager: [phone]  weekends / holidays): Second Contact Pager: [phone]    History of present illness   Gina Cherry is a 66 year old female with no known PMH who presented to the ED today for 5d history of cough, shortness of breath, non-bloody diarrhea and generalized weakness resulting in a fall this morning.  Cough is productive of yellow-ish mucous. Diarrhea has been post-prandial and non-bloody.  Denies fevers, chills, chest pain, URI symptoms, n/v, or abdominal pain.  She reports 2-3 falls over the past few days due to weakness. Most recent fall occurred this morning, while attempting to get out of bed to get to the bathroom. She fell, hitting her head on the corner of the nightstand. She was unable to stand back up, requiring assistance from her fiance to get to the bathroom. Denies dizziness, lightheadedness, palpitations.   She reports long history of tobacco use, currently smoking about 1/2 pack per day. She reports approximately 2 alcoholic beverages per day, typically wine. Denies illicit substance use.   Past Medical History  PMH reviewed. No pertinent PMH  Home Medications     Fosamax 25m by mouth every Saturday.  Allergies    Allergies as of 11/28/2021 - Review Complete 11/28/2021  Allergen Reaction Noted   Codeine Other (See Comments) 11/28/2021    Social History   reports that she has been smoking cigarettes. She has been smoking an average of .5 packs per day. She does not have any smokeless tobacco history on file. She  reports current alcohol use. She reports that she does not use drugs.   Family History   Her family history is not on file.   ROS  10 point review of systems negative unless stated in the HPI.  Objective   Blood pressure 105/61, pulse (!) 106, temperature 100.2 F (37.9 C), temperature source Oral, resp. rate 15, height _0  (1.626 m), weight 45.4 kg, SpO2 94 %.    General: chronically ill, frail appearing female in no distress Eyes: no scleral icterus or conjunctival injection HEENT: approx 1in scalp laceration on the right posterior scalp. No active bleeding although hair is matted with dried blood. No tenderness over the site. Dry MM.  Cardiac: tachycardic rate, regular rhythm, no LE edema Pulm: breathing comfortably at rest on 4L supplemental oxygen. Speaks in full sentences. Diminished lung sounds throughout, no wheezing or rhonchi GI: abdomen soft, non-tender, non-distended. Negative Murphy's sign. No organomegaly. Hyperactive bowel sounds.  Neuro: a/o x4. PERRL. CN grossly intact. Moves all extremities.  MSK: catechetic appearing with diffuse muscle atrophy Skin: no jaundice. Scattered telangiectasias and ecchymosis.  Significant Diagnostic Tests:   Head CT without contrast: no acute hemorrhage  CXR: RLL infiltrate  Labs    CBC Latest Ref Rng & Units 11/28/2021 11/28/2021  WBC 4.0 - 10.5 K/uL - 13.3(H)  Hemoglobin 12.0 - 15.0 g/dL 13.9 13.7  Hematocrit 36.0 - 46.0 % 41.0 38.1  Platelets 150 - 400 K/uL - 177   BMP Latest Ref Rng &  Units 11/28/2021 11/28/2021  Glucose 70 - 99 mg/dL - 159(H)  BUN 8 - 23 mg/dL - 16  Creatinine 0.44 - 1.00 mg/dL - 0.74  Sodium 135 - 145 mmol/L 125(L) 126(L)  Potassium 3.5 - 5.1 mmol/L 3.4(L) 3.5  Chloride 98 - 111 mmol/L - 91(L)  CO2 22 - 32 mmol/L - 22  Calcium 8.9 - 10.3 mg/dL - 8.2(L)   Hepatic Function Latest Ref Rng & Units 11/28/2021  Total Protein 6.5 - 8.1 g/dL 6.1(L)  Albumin 3.5 - 5.0 g/dL 2.5(L)  AST 15 - 41 U/L 210(H)  ALT 0  - 44 U/L 71(H)  Alk Phosphatase 38 - 126 U/L 49  Total Bilirubin 0.3 - 1.2 mg/dL 0.4    COVID 19, influenza A and B: negative  Fecal occult: positive  Urine analysis: Moderate hemoglobin, +ketones, +protein.  Micro exam: 0-5 WBC's per HPF, 0-5 RBC's per HPF, rare bacteria, and hyaline and granular casts seen.   VBG: pH 7.446, pCO2 32.6  Lactate 3.1>>1.7  PT/INR: 12.6/1.0 PTT: 55 Summary  66 year old female admitted for sepsis secondary to RLL pneumonia.   Assessment & Plan:  Principal Problem:   Sepsis ([place])  Acute hypoxic respiratory failure, sepsis secondary to lobar pneumonia Sepsis criteria met with temperature 101.51F, HR 117, WBC 13. Lactate 3.1>1.7. Blood pressures are normotensive. Received 1.5L IVF. Asymptomatic Bacturia. Denies dysuria or suprapubic pain Received vancomycin and cefepime in the ED. Will transition to rocephin and azithromycin  Nasal MRSA PCR. If positive, restart vancomycin Given hyponatremia and GI sx, check urine legionella and strep Follow blood cultures Recommend follow up xray in 4-6 weeks to ensure resolution  Fall with Head laceration. Fall likely due to weakness in the setting of sepsis.  No intracranial bleed on CT. Laceration will be repaired by EDP. Not actively bleeding.  PT/OT consults Stat head CT for acute neurologic changes.   Hemoglobinuria Check CK to r/o rhabdomyolysis  Asymptomatic Hyponatremia. Unknown chronicity. DDX includes acute pulmonary process vs beer potomania, vs SIADH Do not think that urine studies will be helpful at this point after receiving 1.5L of IVF. Will continue to monitor and can consider urine studies if hyponatremia persists.   Hyperglycemia Check A1C  Hepatocellular transaminitis. AST/ALT 210/71 consistent with alcoholic hepatitis. She reports 2 alcoholic drinks per day. No hypotension to support liver shock.  Given the elevated AST and hemoglobinuria, r/o rhabdomyelosis with CK Check serum ETOH, GGT,  viral hepatitis B/C panel If LFTs remain elevated, check RUQ [place] Trend LFTs  + stool occult. No prior colonoscopy. Hemoglobin normal on admission. Will need to follow up with GI after discharge for colonoscopy. If she develops overt GI bleed or significant anemia, can consider inpatient consult. Trend hemoglobins  Tobacco use disorder. Nicotine patch, encourage cessation.   Underweight. BMI 17kg/m2. Dietician consulted for nutritional assessment.    Best practice:  CODE STATUS: FULL DVT for prophylaxis: lovenox Social considerations/Family communication: finance updated at bedside Dispo: Admit patient to Observation with expected length of stay less than 2 midnights.   Mitzi Hansen, MD Internal Medicine Resident PGY-3 [facility] Pager: [phone] 11/28/2021 1:34 PM

## 2021-11-28 NOTE — Progress Notes (Signed)
Report called nurse getting transfer to bed 4 north bed 17, nurse name is Neurosurgeon

## 2021-11-29 ENCOUNTER — Observation Stay (HOSPITAL_COMMUNITY): Payer: Medicare HMO

## 2021-11-29 DIAGNOSIS — Z452 Encounter for adjustment and management of vascular access device: Secondary | ICD-10-CM | POA: Diagnosis not present

## 2021-11-29 DIAGNOSIS — R051 Acute cough: Secondary | ICD-10-CM | POA: Diagnosis not present

## 2021-11-29 DIAGNOSIS — E871 Hypo-osmolality and hyponatremia: Secondary | ICD-10-CM | POA: Diagnosis present

## 2021-11-29 DIAGNOSIS — N179 Acute kidney failure, unspecified: Secondary | ICD-10-CM | POA: Diagnosis present

## 2021-11-29 DIAGNOSIS — J439 Emphysema, unspecified: Secondary | ICD-10-CM | POA: Diagnosis present

## 2021-11-29 DIAGNOSIS — R509 Fever, unspecified: Secondary | ICD-10-CM

## 2021-11-29 DIAGNOSIS — I700 Atherosclerosis of aorta: Secondary | ICD-10-CM | POA: Diagnosis not present

## 2021-11-29 DIAGNOSIS — F1010 Alcohol abuse, uncomplicated: Secondary | ICD-10-CM | POA: Diagnosis present

## 2021-11-29 DIAGNOSIS — J9600 Acute respiratory failure, unspecified whether with hypoxia or hypercapnia: Secondary | ICD-10-CM | POA: Diagnosis not present

## 2021-11-29 DIAGNOSIS — Z4659 Encounter for fitting and adjustment of other gastrointestinal appliance and device: Secondary | ICD-10-CM | POA: Diagnosis not present

## 2021-11-29 DIAGNOSIS — R636 Underweight: Secondary | ICD-10-CM | POA: Diagnosis not present

## 2021-11-29 DIAGNOSIS — I5181 Takotsubo syndrome: Secondary | ICD-10-CM | POA: Diagnosis not present

## 2021-11-29 DIAGNOSIS — Z9181 History of falling: Secondary | ICD-10-CM

## 2021-11-29 DIAGNOSIS — W06XXXA Fall from bed, initial encounter: Secondary | ICD-10-CM | POA: Diagnosis not present

## 2021-11-29 DIAGNOSIS — E874 Mixed disorder of acid-base balance: Secondary | ICD-10-CM | POA: Diagnosis present

## 2021-11-29 DIAGNOSIS — E43 Unspecified severe protein-calorie malnutrition: Secondary | ICD-10-CM | POA: Insufficient documentation

## 2021-11-29 DIAGNOSIS — R6521 Severe sepsis with septic shock: Secondary | ICD-10-CM | POA: Diagnosis not present

## 2021-11-29 DIAGNOSIS — R918 Other nonspecific abnormal finding of lung field: Secondary | ICD-10-CM | POA: Diagnosis not present

## 2021-11-29 DIAGNOSIS — R197 Diarrhea, unspecified: Secondary | ICD-10-CM | POA: Diagnosis not present

## 2021-11-29 DIAGNOSIS — M47816 Spondylosis without myelopathy or radiculopathy, lumbar region: Secondary | ICD-10-CM | POA: Diagnosis not present

## 2021-11-29 DIAGNOSIS — R6520 Severe sepsis without septic shock: Secondary | ICD-10-CM | POA: Diagnosis not present

## 2021-11-29 DIAGNOSIS — J440 Chronic obstructive pulmonary disease with acute lower respiratory infection: Secondary | ICD-10-CM | POA: Diagnosis not present

## 2021-11-29 DIAGNOSIS — D539 Nutritional anemia, unspecified: Secondary | ICD-10-CM | POA: Diagnosis present

## 2021-11-29 DIAGNOSIS — Z789 Other specified health status: Secondary | ICD-10-CM | POA: Diagnosis not present

## 2021-11-29 DIAGNOSIS — J811 Chronic pulmonary edema: Secondary | ICD-10-CM | POA: Diagnosis not present

## 2021-11-29 DIAGNOSIS — Z720 Tobacco use: Secondary | ICD-10-CM

## 2021-11-29 DIAGNOSIS — R0600 Dyspnea, unspecified: Secondary | ICD-10-CM | POA: Diagnosis not present

## 2021-11-29 DIAGNOSIS — L89151 Pressure ulcer of sacral region, stage 1: Secondary | ICD-10-CM | POA: Diagnosis not present

## 2021-11-29 DIAGNOSIS — A419 Sepsis, unspecified organism: Secondary | ICD-10-CM | POA: Diagnosis not present

## 2021-11-29 DIAGNOSIS — E440 Moderate protein-calorie malnutrition: Secondary | ICD-10-CM | POA: Diagnosis not present

## 2021-11-29 DIAGNOSIS — K7010 Alcoholic hepatitis without ascites: Secondary | ICD-10-CM | POA: Diagnosis present

## 2021-11-29 DIAGNOSIS — R531 Weakness: Secondary | ICD-10-CM | POA: Diagnosis not present

## 2021-11-29 DIAGNOSIS — A481 Legionnaires' disease: Secondary | ICD-10-CM | POA: Diagnosis not present

## 2021-11-29 DIAGNOSIS — I248 Other forms of acute ischemic heart disease: Secondary | ICD-10-CM | POA: Diagnosis present

## 2021-11-29 DIAGNOSIS — Z978 Presence of other specified devices: Secondary | ICD-10-CM | POA: Diagnosis not present

## 2021-11-29 DIAGNOSIS — R059 Cough, unspecified: Secondary | ICD-10-CM | POA: Diagnosis not present

## 2021-11-29 DIAGNOSIS — J9690 Respiratory failure, unspecified, unspecified whether with hypoxia or hypercapnia: Secondary | ICD-10-CM | POA: Diagnosis not present

## 2021-11-29 DIAGNOSIS — G9341 Metabolic encephalopathy: Secondary | ICD-10-CM | POA: Diagnosis not present

## 2021-11-29 DIAGNOSIS — J410 Simple chronic bronchitis: Secondary | ICD-10-CM | POA: Diagnosis not present

## 2021-11-29 DIAGNOSIS — Z681 Body mass index (BMI) 19 or less, adult: Secondary | ICD-10-CM | POA: Diagnosis not present

## 2021-11-29 DIAGNOSIS — J9811 Atelectasis: Secondary | ICD-10-CM | POA: Diagnosis not present

## 2021-11-29 DIAGNOSIS — Y92003 Bedroom of unspecified non-institutional (private) residence as the place of occurrence of the external cause: Secondary | ICD-10-CM | POA: Diagnosis not present

## 2021-11-29 DIAGNOSIS — J189 Pneumonia, unspecified organism: Secondary | ICD-10-CM | POA: Diagnosis not present

## 2021-11-29 DIAGNOSIS — J90 Pleural effusion, not elsewhere classified: Secondary | ICD-10-CM | POA: Diagnosis not present

## 2021-11-29 DIAGNOSIS — R7989 Other specified abnormal findings of blood chemistry: Secondary | ICD-10-CM | POA: Diagnosis not present

## 2021-11-29 DIAGNOSIS — J9601 Acute respiratory failure with hypoxia: Secondary | ICD-10-CM | POA: Diagnosis not present

## 2021-11-29 DIAGNOSIS — D509 Iron deficiency anemia, unspecified: Secondary | ICD-10-CM | POA: Diagnosis not present

## 2021-11-29 DIAGNOSIS — J80 Acute respiratory distress syndrome: Secondary | ICD-10-CM | POA: Diagnosis not present

## 2021-11-29 DIAGNOSIS — R0902 Hypoxemia: Secondary | ICD-10-CM | POA: Diagnosis not present

## 2021-11-29 DIAGNOSIS — J9602 Acute respiratory failure with hypercapnia: Secondary | ICD-10-CM | POA: Diagnosis not present

## 2021-11-29 DIAGNOSIS — Z4682 Encounter for fitting and adjustment of non-vascular catheter: Secondary | ICD-10-CM | POA: Diagnosis not present

## 2021-11-29 DIAGNOSIS — I5021 Acute systolic (congestive) heart failure: Secondary | ICD-10-CM | POA: Diagnosis not present

## 2021-11-29 DIAGNOSIS — Z20822 Contact with and (suspected) exposure to covid-19: Secondary | ICD-10-CM | POA: Diagnosis not present

## 2021-11-29 DIAGNOSIS — R570 Cardiogenic shock: Secondary | ICD-10-CM | POA: Diagnosis not present

## 2021-11-29 DIAGNOSIS — R5381 Other malaise: Secondary | ICD-10-CM | POA: Diagnosis not present

## 2021-11-29 LAB — PROCALCITONIN: Procalcitonin: 18.75 ng/mL

## 2021-11-29 LAB — COMPREHENSIVE METABOLIC PANEL
ALT: 63 U/L — ABNORMAL HIGH (ref 0–44)
ALT: 74 U/L — ABNORMAL HIGH (ref 0–44)
AST: 220 U/L — ABNORMAL HIGH (ref 15–41)
AST: 234 U/L — ABNORMAL HIGH (ref 15–41)
Albumin: 1.9 g/dL — ABNORMAL LOW (ref 3.5–5.0)
Albumin: 2.1 g/dL — ABNORMAL LOW (ref 3.5–5.0)
Alkaline Phosphatase: 38 U/L (ref 38–126)
Alkaline Phosphatase: 42 U/L (ref 38–126)
Anion gap: 14 (ref 5–15)
Anion gap: 13 (ref 5–15)
BUN: 11 mg/dL (ref 8–23)
BUN: 10 mg/dL (ref 8–23)
CO2: 17 mmol/L — ABNORMAL LOW (ref 22–32)
CO2: 21 mmol/L — ABNORMAL LOW (ref 22–32)
Calcium: 7.7 mg/dL — ABNORMAL LOW (ref 8.9–10.3)
Calcium: 7.8 mg/dL — ABNORMAL LOW (ref 8.9–10.3)
Chloride: 99 mmol/L (ref 98–111)
Chloride: 96 mmol/L — ABNORMAL LOW (ref 98–111)
Creatinine, Ser: 0.59 mg/dL (ref 0.44–1.00)
Creatinine, Ser: 0.64 mg/dL (ref 0.44–1.00)
GFR, Estimated: >60 mL/min (ref >60)
GFR, Estimated: >60 mL/min (ref >60)
Glucose, Bld: 92 mg/dL (ref 70–99)
Glucose, Bld: 92 mg/dL (ref 70–99)
Potassium: 5.2 mmol/L — ABNORMAL HIGH (ref 3.5–5.1)
Potassium: 3.5 mmol/L (ref 3.5–5.1)
Sodium: 130 mmol/L — ABNORMAL LOW (ref 135–145)
Sodium: 130 mmol/L — ABNORMAL LOW (ref 135–145)
Total Bilirubin: 1.2 mg/dL (ref 0.3–1.2)
Total Bilirubin: 0.5 mg/dL (ref 0.3–1.2)
Total Protein: 4.7 g/dL — ABNORMAL LOW (ref 6.5–8.1)
Total Protein: 5.1 g/dL — ABNORMAL LOW (ref 6.5–8.1)

## 2021-11-29 LAB — ECHOCARDIOGRAM COMPLETE
AR max vel: 1.88 cm2
AV Area VTI: 1.77 cm2
AV Area mean vel: 1.72 cm2
AV Mean grad: 4 mmHg
AV Peak grad: 6.6 mmHg
Ao pk vel: 1.28 m/s
Area-P 1/2: 4.1 cm2
Calc EF: 57.1 %
Height: 64 in
S' Lateral: 2.2 cm
Single Plane A2C EF: 56.1 %
Single Plane A4C EF: 55.5 %
Weight: 1600 oz

## 2021-11-29 LAB — CBC
HCT: 37.5 % (ref 36.0–46.0)
Hemoglobin: 13.2 g/dL (ref 12.0–15.0)
MCH: 36.1 pg — ABNORMAL HIGH (ref 26.0–34.0)
MCHC: 35.2 g/dL (ref 30.0–36.0)
MCV: 102.5 fL — ABNORMAL HIGH (ref 80.0–100.0)
Platelets: 157 10*3/uL (ref 150–400)
RBC: 3.66 MIL/uL — ABNORMAL LOW (ref 3.87–5.11)
RDW: 13 % (ref 11.5–15.5)
WBC: 16.5 10*3/uL — ABNORMAL HIGH (ref 4.0–10.5)
nRBC: 0 % (ref 0.0–0.2)

## 2021-11-29 LAB — MAGNESIUM: Magnesium: 2 mg/dL (ref 1.7–2.4)

## 2021-11-29 LAB — HCV AB W REFLEX TO QUANT PCR: HCV Ab: NONREACTIVE

## 2021-11-29 LAB — HCV INTERPRETATION

## 2021-11-29 MED ORDER — DEXTROSE IN LACTATED RINGERS 5 % IV SOLN: INTRAVENOUS | Status: DC

## 2021-11-29 MED ORDER — ENSURE ENLIVE PO LIQD
237.0000 mL | Freq: Three times a day (TID) | ORAL | Status: DC
  Administered 2021-11-29 (×2): 237 mL via ORAL

## 2021-11-29 NOTE — Progress Notes (Signed)
NAME:  Gina Cherry, MRN:  [id number], DOB:  26-Jul-1956, LOS: 0 ADMISSION DATE:  11/28/2021, CONSULTATION DATE:  11/29/21 REFERRING MD:  Gina Cherry, CHIEF COMPLAINT:   worsening hypoxia/dyspnea  History of Present Illness:  Gina Cherry is a 66 y.o. F with PMH of tobacco and ETOH use who presented with 5-6 days of fatigue and poor appetite and diarrhea with mild coughing.  She tried to get out of bed and fell, hitting her head which prompted her significant other to call EMS.  She was found to have bilateral pneumonia with WBC 13k and mildly elevated LFT' s along with hypokalemia.  She was admitted to the floor and given 3L IVF, she initially required 4L [place].   Her oxygen requirement gradually increased and she was transitioned to maximal HFNC, therefore PCCM consulted.   Pt states that she feels better tonight, though is having to sit forward to breath comfortably.  She has been smoking 1/2 ppd for 40+ years, no diagnosis of emphysema or COPD and denies shortness of breath on a normal basis. Repeat CXR with mildly progressing bilateral opacities.   Pertinent  Medical History  Tobacco and ETOH use    Significant Hospital Events: Including procedures, antibiotic start and stop dates in addition to other pertinent events   2/13 admitted to teaching service, later required HFNC and ICU trxfr, On Vanc/Ceftriaxone/Azithromycin 2/14 wbc ct up. Vancs stopped as MRSA PCR neg   Interim History / Subjective:  Feels a little better   Objective   Blood pressure (Abnormal) 143/82, pulse (Abnormal) 124, temperature (Abnormal) 100.8 F (38.2 C), temperature source Oral, resp. rate 20, height 5\' 4"  (1.626 m), weight 45.4 kg, SpO2 96 %.    FiO2 (%):  [100 %] 100 %   Intake/Output Summary (Last 24 hours) at 11/29/2021 0753 Last data filed at 11/29/2021 0400 Gross per 24 hour  Intake 5146.68 ml  Output 200 ml  Net 4946.68 ml   Filed Weights   11/28/21 0847  Weight: 45.4 kg   General 66  year old female resting in bed. No distress HENT NCAT no JVD MMM Pulm 90% heated high flow. Decreased R>L no accessory use on current level of support Pcxr R>L airspace disease. Perhaps a little worse c/w prior film Card rrr Abd soft Ext warm and dry  Neuro intact   Resolved Hospital Problem list     Assessment & Plan:  Principal Problem:   CAP (community acquired pneumonia) Active Problems:   Acute respiratory failure with hypoxia (HCC)   Sepsis (HCC)   Weakness   Elevated LFTs   Tobacco abuse   Risk for falls   Acute Hypoxic Respiratory Failure secondary to bilateral PNA Long term tobacco use Plan Cont to wean oxygen  Pulse ox  F/u echo  Trend PCT  Cont current abx (CTX and azith) can stop vanc as PCR was neg F/u legionella and strep antigen  71 right chest ? Effusion if large enough consider thora   Sepsis 2/2 above Lactate cleared w/ IVFs Plan Keep euvolemic  Abx as above   H/o tobacco abuse Plan Patch   Fluid & Electrolyte imbalance: Hyponatremia, hypokalemia Na improved  Plan Replace K  Assess daily chems   Elevated LFT's Alcohol Use Acute hepatitis panel neg  Plan Cont to trend LFTs Cont thiamine,folate and MVs Watch for evidence of WD  Fall risk Plan  PT consult    Best Practice (right click and "Reselect all SmartList Selections" daily)   Diet/type: Regular  consistency (see orders) DVT prophylaxis: LMWH GI prophylaxis: N/A Lines: N/A Foley:  N/A Code Status:  full code Last date of multidisciplinary goals of care discussion [Discussed intubation with patient and SO, would want full code]  Simonne Martinet ACNP-BC [facility] Pulmonary/Critical Care Pager # [phone] OR # [phone] if no answer

## 2021-11-29 NOTE — Progress Notes (Signed)
°  Transition of Care [facility]) Screening Note   Patient Details  Name: Gina Cherry Date of Birth: Sep 21, 1956   Transition of Care [facility]) CM/SW Contact:    Glennon Mac, RN Phone Number: 11/29/2021, 5:22 PM    Transition of Care Department [facility]) has reviewed patient and no TOC needs have been identified at this time. We will continue to monitor patient advancement through interdisciplinary progression rounds. If new patient transition needs arise, please place a TOC consult.  Quintella Baton, RN, BSN  Trauma/Neuro ICU Case Manager [phone]

## 2021-11-29 NOTE — Progress Notes (Signed)
Inpatient Rehab Admissions Coordinator:   Per therapy recommendations pt was screened for CIR by Freddie Apley, DPT.  Note on 30L HFNC with potential for intubation.  Will follow for another day or so for stability and decreased O2 requirements and rescreen at that time.   Estill Dooms, PT, DPT Admissions Coordinator [phone] 11/29/21  4:19 PM

## 2021-11-29 NOTE — Evaluation (Signed)
Occupational Therapy Evaluation Patient Details Name: Gina Cherry MRN: [id number] DOB: Nov 06, 1955 Today's Date: 11/29/2021   History of Present Illness Pt is 66 yo female who presents with 5 day h/o cough, SOB, diarrhea, and weakness that resulted in a fall with head lacerations, CT (-) for ICH. Pt found to have sepsis due to RLL PNA. PMH: smoker, EtOH abuse   Clinical Impression   PTA pt lives independently with her significant other. Able to progress to [facility] then to recliner with mod A +2 for safety/equipment management; requires mod to max A with ADL tasks on HHFNC 90%FiO2 due to deficits listed below. Noted apparent confusion at times during session - will further assess. Note desat into 52s with mobility and Max HR in the 140s. Given recent decline in functional status and recent fall, recommend rehab at [facility] to facilitate safe DC home. Acute OT to follow.     Recommendations for follow up therapy are one component of a multi-disciplinary discharge planning process, led by the attending physician.  Recommendations may be updated based on patient status, additional functional criteria and insurance authorization.   Follow Up Recommendations  Acute inpatient rehab (3hours/day)    Assistance Recommended at Discharge Frequent or constant Supervision/Assistance  Patient can return home with the following A lot of help with walking and/or transfers;A lot of help with bathing/dressing/bathroom;Assistance with cooking/housework;Direct supervision/assist for medications management;Direct supervision/assist for financial management;Assist for transportation;Help with stairs or ramp for entrance    Functional Status Assessment  Patient has had a recent decline in their functional status and demonstrates the ability to make significant improvements in function in a reasonable and predictable amount of time.  Equipment Recommendations  BSC/3in1    Recommendations for Other Services Rehab  consult     Precautions / Restrictions Precautions Precautions: Fall Restrictions Weight Bearing Restrictions: No Other Position/Activity Restrictions: watch sats and HR      Mobility Bed Mobility      Mod A with supine to sit EOB; increased SOB              Transfers       Sit to Stand: Mod assist, +2 safety/equipment Stand pivot transfers: Mod assist, +2 safety/equipment    C/o dizziness during mobility; BP stable            Balance                                           ADL either performed or assessed with clinical judgement   ADL Overall ADL's : Needs assistance/impaired Eating/Feeding: Set up   Grooming: Moderate assistance   Upper Body Bathing: Moderate assistance   Lower Body Bathing: Moderate assistance   Upper Body Dressing : Moderate assistance   Lower Body Dressing: Moderate assistance   Toilet Transfer: Moderate assistance;+2 for safety/equipment   Toileting- Clothing Manipulation and Hygiene: Maximal assistance       Functional mobility during ADLs: Moderate assistance;+2 for safety/equipment General ADL Comments: easily fatigues; complains of dizziness     Vision Baseline Vision/History: 0 No visual deficits;1 Wears glasses       Perception     Praxis      Pertinent Vitals/Pain       Hand Dominance Right   Extremity/Trunk Assessment Upper Extremity Assessment Upper Extremity Assessment: Generalized weakness   Lower Extremity Assessment Lower Extremity Assessment: Generalized weakness   Cervical /  Trunk Assessment Cervical / Trunk Assessment: Normal   Communication Communication Communication: Other (comment) (SOB with speaking)   Cognition   Behavior During Therapy: WFL for tasks assessed/performed, Anxious                                   General Comments: pt seems to be mildly confused, can give history but has some difficulty following commands and responds slowly;  easily distracted at times     General Comments  SPO2 into 80's on 30 L 90% heated HFNC. HR as high as 140 bpm.    Exercises Exercises: Other exercises Other Exercises Other Exercises: encouraged incentive spirometer   Shoulder Instructions      Home Living Family/patient expects to be discharged to:: Private residence Living Arrangements: Spouse/significant other Available Help at Discharge: Family;Available 24 hours/day Type of Home: House Home Access: Stairs to enter [employer] of Steps: 3 Entrance Stairs-Rails: Left Home Layout: One level     Bathroom Shower/Tub: Tub/shower unit;Door   [employer] Toilet: Standard     Home Equipment: None   Additional Comments: partner is Consulting civil engineer. They are both retired. Just got back from vacation [facility]      Prior Functioning/Environment Prior Level of Function : Independent/Modified Independent;Driving                        OT Problem List: Decreased strength;Decreased activity tolerance;Impaired balance (sitting and/or standing);Decreased safety awareness;Decreased cognition;Decreased knowledge of use of DME or AE;Cardiopulmonary status limiting activity      OT Treatment/Interventions: Self-care/ADL training;Therapeutic exercise;Neuromuscular education;Energy conservation;DME and/or AE instruction;Therapeutic activities;Cognitive remediation/compensation;Patient/family education;Balance training    OT Goals(Current goals can be found in the care plan section) Acute Rehab OT Goals Patient Stated Goal: to get better OT Goal Formulation: With patient Time For Goal Achievement: 12/13/21 Potential to Achieve Goals: Good  OT Frequency: Min 2X/week    Co-evaluation PT/OT/SLP Co-Evaluation/Treatment: Yes (partialsession) Reason for Co-Treatment: Complexity of the patient's impairments (multi-system involvement);Necessary to address cognition/behavior during functional activity;For patient/therapist safety   OT  goals addressed during session: ADL's and self-care      AM-PAC OT "6 Clicks" Daily Activity     Outcome Measure Help from another person eating meals?: A Little Help from another person taking care of personal grooming?: A Lot Help from another person toileting, which includes using toliet, bedpan, or urinal?: A Lot Help from another person bathing (including washing, rinsing, drying)?: A Lot Help from another person to put on and taking off regular upper body clothing?: A Lot Help from another person to put on and taking off regular lower body clothing?: A Lot 6 Click Score: 13   End of Session Equipment Utilized During Treatment: Gait belt;Rolling walker (2 wheels);Oxygen Nurse Communication: Mobility status  Activity Tolerance: Patient tolerated treatment well Patient left: in chair;with call bell/phone within reach  OT Visit Diagnosis: Unsteadiness on feet (R26.81);Other abnormalities of gait and mobility (R26.89);Muscle weakness (generalized) (M62.81);History of falling (Z91.81);Other symptoms and signs involving the nervous system (R29.898);Other symptoms and signs involving cognitive function;Dizziness and giddiness (R42)                Time: 1357-1430 OT Time Calculation (min): 33 min Charges:  OT General Charges $OT Visit: 1 Visit OT Evaluation $OT Eval Moderate Complexity: Ruch, OT 11/29/2021   Tamim Skog,HILLARY 11/29/2021, 4:34 PM

## 2021-11-29 NOTE — Progress Notes (Signed)
Bedside US of chest to eval for Right effusion  Findings: Minimal pleural fluid. Sample not large enough at this point to safely sample   Erick Colace ACNP-BC [place] Pager # [phone] OR # [phone] if no answer

## 2021-11-29 NOTE — Progress Notes (Signed)
Initial Nutrition Assessment  DOCUMENTATION CODES:  Underweight, Severe malnutrition in context of social or environmental circumstances  INTERVENTION:  Continue regular diet, encourage PO intake Ensure Enlive po TID, each supplement provides 350 kcal and 20 grams of protein. Continue vitamin regimen as ordered  NUTRITION DIAGNOSIS:  Moderate Malnutrition (in the context of social/environmental circumstances) related to  (prolonged inadequate energy intake) as evidenced by mild fat depletion, moderate fat depletion, severe muscle depletion.  GOAL:  Patient will meet greater than or equal to 90% of their needs  MONITOR:  PO intake, Supplement acceptance, Weight trends  REASON FOR ASSESSMENT:  Consult Assessment of nutrition requirement/status  ASSESSMENT:  66 year old female with hx of tobacco use presented to ED with cough, decreased appetite, diarrhea, and weakness ongoing for the past week. Workup in ED consistent with sepsis likely due to pneumonia.  Pt's O2 requirements increased and eventually was transferred to ICU on HFNC with concern that pt will require intubation.  Pt resting in bed at the time of assessment with family at bedside. Inquired about recent nutrition hx and pt reports that for the last week since she has been sick, her intake has been very low. Reports she has only been eating a few bites of food for lunch and dinner. Drinks water during the day. Pt reports that prior to illness the last week, she was eating well and appetite was intact.   Pt reports that her weight is stable at ~100 lbs. She feels she has lost weight over the last week since she has not been eating and feels much weaker.  Significant muscle and fat deficits present on admission suggestive of prolonged inadequate energy intake.  Pt states she has not eaten a meal since being admitted. Brought pt a menu and educated on the ordering process. Encouraged pt to place all meal orders and attempt to  eat. Also is agreeable to receiving ensure enlive between meals to augment intake. Vitamin regimen in place.  Nutritionally Relevant Medications: Scheduled Meds:  folic acid  1 mg Oral Daily   multivitamin with minerals  1 tablet Oral Daily   potassium chloride  40 mEq Oral TID   thiamine  100 mg Oral Daily   Labs Reviewed: Sodium 130, Chloride 96   NUTRITION - FOCUSED PHYSICAL EXAM: Flowsheet Row Most Recent Value  Orbital Region Mild depletion  Upper Arm Region Moderate depletion  Thoracic and Lumbar Region Moderate depletion  Buccal Region Mild depletion  Temple Region Moderate depletion  Clavicle Bone Region Moderate depletion  Clavicle and Acromion Bone Region Severe depletion  Scapular Bone Region Severe depletion  Dorsal Hand Mild depletion  Patellar Region Severe depletion  Anterior Thigh Region Severe depletion  Posterior Calf Region Severe depletion  Edema (RD Assessment) None  Hair Reviewed  Eyes Reviewed  Mouth Reviewed  Skin Reviewed  Nails Reviewed   Diet Order:   Diet Order             Diet regular Room service appropriate? Yes; Fluid consistency: Thin  Diet effective now                   EDUCATION NEEDS:  Education needs have been addressed  Skin:  Skin Assessment: Reviewed RN Assessment (Ecchymosis to the bilateral legs, laceration to the head)  Last BM:  2/14 - type 7  Height:  Ht Readings from Last 1 Encounters:  11/28/21 5\' 4"  (1.626 m)    Weight:  Wt Readings from Last 1 Encounters:  11/28/21  45.4 kg    Ideal Body Weight:  54.5 kg  BMI:  Body mass index is 17.16 kg/m.  Estimated Nutritional Needs:  Kcal:  1500-1700 kcal/d Protein:  75-85 g/d Fluid:  1.5-1.7 L/d   Ranell Patrick, RD, LDN Clinical Dietitian RD pager # available in [facility]  After hours/weekend pager # available in [facility]

## 2021-11-29 NOTE — Progress Notes (Signed)
°   11/28/21 2036  Assess: MEWS Score  Temp (!) 103 F (39.4 C)  BP 134/78  Pulse Rate (!) 137  ECG Heart Rate (!) 116  Resp 18  Level of Consciousness Alert  SpO2 92 %  O2 Device HFNC  O2 Flow Rate (L/min) 15 L/min  Assess: MEWS Score  MEWS Temp 2  MEWS Systolic 0  MEWS Pulse 2  MEWS RR 0  MEWS LOC 0  MEWS Score 4  MEWS Score Color Red  Assess: if the MEWS score is Yellow or Red  Were vital signs taken at a resting state? Yes  Focused Assessment Change from prior assessment (see assessment flowsheet)  Early Detection of Sepsis Score *See Row Information* High  MEWS guidelines implemented *See Row Information* Yes  Treat  MEWS Interventions Escalated (See documentation below)  Pain Scale 0-10  Pain Score 0  Take Vital Signs  Increase Vital Sign Frequency  Red: Q 1hr X 4 then Q 4hr X 4, if remains red, continue Q 4hrs  Escalate  MEWS: Escalate Red: discuss with charge nurse/RN and provider, consider discussing with RRT  Notify: Charge Nurse/RN  Name of Charge Nurse/RN Notified APRIL RN  Date Charge Nurse/RN Notified 11/28/21  Time Charge Nurse/RN Notified 2104  Notify: Provider  Provider Name/Title ZINOVIEV MD  Date Provider Notified 11/28/21  Time Provider Notified 2106  Notification Type Page  Notification Reason Change in status;Other (Comment) (RED MEWS AND DOES SHE NEED CONT IV FLUIDS)

## 2021-11-29 NOTE — Evaluation (Signed)
Physical Therapy Evaluation Patient Details Name: Gina Cherry MRN: [id number] DOB: 11-11-1955 Today's Date: 11/29/2021  History of Present Illness  Pt is 66 yo female who presents with 5 day h/o cough, SOB, diarrhea, and weakness that resulted in a fall with head lacerations, CT (-) for ICH. Pt found to have sepsis due to RLL PNA. PMH: smoker, EtOH abuse  Clinical Impression  Pt admitted with above diagnosis. Pt from home with partner, recently traveled to [facility], appears to have been cognitively in tact PTA. Today she is able to give past history and is AxOx4 but is processing slowly, seems to have decreased insight into deficits, and has trouble following directional commands. Pt fatigues very quickly with activity and required mod A +2 to transfer surface to surface and was unable to tolerate gait today. Recommending AIR consult at this time due to deficits and potential for return to independence.  Pt currently with functional limitations due to the deficits listed below (see PT Problem List). Pt will benefit from skilled PT to increase their independence and safety with mobility to allow discharge to the venue listed below.          Recommendations for follow up therapy are one component of a multi-disciplinary discharge planning process, led by the attending physician.  Recommendations may be updated based on patient status, additional functional criteria and insurance authorization.  Follow Up Recommendations Acute inpatient rehab (3hours/day)    Assistance Recommended at Discharge Frequent or constant Supervision/Assistance  Patient can return home with the following  Two people to help with walking and/or transfers;Two people to help with bathing/dressing/bathroom;Assistance with cooking/housework;Direct supervision/assist for medications management;Direct supervision/assist for financial management;Assist for transportation;Help with stairs or ramp for entrance     Equipment Recommendations Rolling walker (2 wheels)  Recommendations for Other Services  Rehab consult    Functional Status Assessment Patient has had a recent decline in their functional status and demonstrates the ability to make significant improvements in function in a reasonable and predictable amount of time.     Precautions / Restrictions Precautions Precautions: Fall Restrictions Weight Bearing Restrictions: No Other Position/Activity Restrictions: watch sats and HR      Mobility  Bed Mobility               General bed mobility comments: pt received in chair    Transfers Overall transfer level: Needs assistance Equipment used: 2 person hand held assist Transfers: Sit to/from Stand, Bed to chair/wheelchair/BSC Sit to Stand: Mod assist, +2 physical assistance Stand pivot transfers: Mod assist, +2 physical assistance         General transfer comment: pt with posterior lean in standing and seemed to have decreased positional awareness. Facilitation from behind for wt shifting to chair and support in front of pt to prevent posterior LOB    Ambulation/Gait               General Gait Details: not tolerated today  Stairs            Wheelchair Mobility    Modified Rankin (Stroke Patients Only)       Balance Overall balance assessment: Needs assistance Sitting-balance support: Feet supported, Bilateral upper extremity supported Sitting balance-Leahy Scale: Fair   Postural control: Posterior lean Standing balance support: Bilateral upper extremity supported Standing balance-Leahy Scale: Poor Standing balance comment: mod A +2 needed to maintain standing  Pertinent Vitals/Pain Pain Assessment Pain Assessment: No/denies pain    Home Living Family/patient expects to be discharged to:: Private residence Living Arrangements: Spouse/significant other Available Help at Discharge: Family;Available 24  hours/day Type of Home: House Home Access: Stairs to enter Entrance Stairs-Rails: Left Entrance Stairs-Number of Steps: 3   Home Layout: One level Home Equipment: None Additional Comments: partner is Engineer, maintenance. They are both retired. Just got back from vacation at [facility]    Prior Function Prior Level of Function : Independent/Modified Independent;Driving                     Hand Dominance        Extremity/Trunk Assessment   Upper Extremity Assessment Upper Extremity Assessment: Defer to OT evaluation    Lower Extremity Assessment Lower Extremity Assessment: Generalized weakness    Cervical / Trunk Assessment Cervical / Trunk Assessment: Normal  Communication   Communication: Other (comment) (SOB with speaking)  Cognition Arousal/Alertness: Awake/alert Behavior During Therapy: WFL for tasks assessed/performed Overall Cognitive Status: No family/caregiver present to determine baseline cognitive functioning                                 General Comments: pt seems to be mildly confused, can give history but has some difficulty following commands and responses slow        General Comments General comments (skin integrity, edema, etc.): SPO2 into 80's on 30 L 90% heated HFNC. HR as high as 140 bpm.    Exercises     Assessment/Plan    PT Assessment Patient needs continued PT services  PT Problem List Cardiopulmonary status limiting activity;Decreased strength;Decreased activity tolerance;Decreased balance;Decreased mobility;Decreased coordination;Decreased cognition;Decreased knowledge of use of DME;Decreased safety awareness;Decreased knowledge of precautions       PT Treatment Interventions DME instruction;Gait training;Stair training;Functional mobility training;Therapeutic activities;Therapeutic exercise;Balance training;Neuromuscular re-education;Cognitive remediation;Patient/family education    PT Goals (Current goals can be found in the Care  Plan section)  Acute Rehab PT Goals Patient Stated Goal: return home PT Goal Formulation: With patient Time For Goal Achievement: 12/13/21 Potential to Achieve Goals: Good    Frequency Min 3X/week     Co-evaluation PT/OT/SLP Co-Evaluation/Treatment: Yes Reason for Co-Treatment: Complexity of the patient's impairments (multi-system involvement);Necessary to address cognition/behavior during functional activity;For patient/therapist safety PT goals addressed during session: Mobility/safety with mobility;Balance         AM-PAC PT "6 Clicks" Mobility  Outcome Measure Help needed turning from your back to your side while in a flat bed without using bedrails?: A Little Help needed moving from lying on your back to sitting on the side of a flat bed without using bedrails?: A Lot Help needed moving to and from a bed to a chair (including a wheelchair)?: Total Help needed standing up from a chair using your arms (e.g., wheelchair or bedside chair)?: Total Help needed to walk in hospital room?: Total Help needed climbing 3-5 steps with a railing? : Total 6 Click Score: 9    End of Session Equipment Utilized During Treatment: Oxygen Activity Tolerance: Patient limited by fatigue Patient left: in chair;with call bell/phone within reach Nurse Communication: Mobility status PT Visit Diagnosis: Muscle weakness (generalized) (M62.81);Difficulty in walking, not elsewhere classified (R26.2)    Time: 1400-1431 PT Time Calculation (min) (ACUTE ONLY): 31 min   Charges:   PT Evaluation $PT Eval Moderate Complexity: 1 Mod  Lyanne Co, PT  Acute Rehab Services  Pager [phone] Office [phone]   Lawana Chambers Renan Danese 11/29/2021, 3:57 PM

## 2021-11-29 NOTE — Consult Note (Incomplete)
NAME:  Gina Cherry, MRN:  [id number], DOB:  09-18-1956, LOS: 0 ADMISSION DATE:  11/28/2021, CONSULTATION DATE:  *** REFERRING MD:  ***, CHIEF COMPLAINT:  ***   History of Present Illness:  66yo F with 5-6 day history of fatigue, poor appetite, SOB with mild productive cough with yellowish mucous, non-bloody diarrhea and generalized weakness. Patient has had 2-3 falls over the past couple days prior to presentation secondary to weakness with most recent fall the morning of 2/13 that resulted in patient hitting her head on the nightstand with no LOC, EMS called  Pertinent  Medical History  Current tobacco abuse (smoking 1/2 ppd for 40+yrs), ETOH (2 drinks per day),   Significant Hospital Events: Including procedures, antibiotic start and stop dates in addition to other pertinent events   2/13 Admitted to teaching service, later required HFNC and ICU transfer. Increasing oxygen requirements. CXR right>left pulmonary airspace disease ?bilateral pulmonary opacities. On Abx Vanc/Ceftriaxone/Azithromycin 2/14 currently on HFNC 30L/100%; O2sat 92-93%; WBC trending up. Tachycardia. Vanc stopped re: MRSA PCR neg. ECHO revealed moderately reduced LV function with regional wall motion abnormalities, EF 30-35%. Hypokinesis of the basal to mid segments of all walls of the LV. There is apical hyperkinesis.  2/15 requiring increased oxygen demand. Continued on HFNC 30L/100% in addition NRB. BLE mottled. CVP 2. Vancomycin added back. RIJ CVL placed. Right radial aline placed. Cardiology/HF team consulted.  Interim History / Subjective:  Increased WOB, BLE mottled. Increase in supplemental oxygen need  Objective   Blood pressure 135/85, pulse (!) 135, temperature (!) 100.7 F (38.2 C), resp. rate (!) 26, height _0  (1.626 m), weight 45.4 kg, SpO2 92 %.    FiO2 (%):  [100 %] 100 %   Intake/Output Summary (Last 24 hours) at 11/29/2021 0801 Last data filed at 11/29/2021 0400 Gross per 24 hour  Intake  5146.68 ml  Output 200 ml  Net 4946.68 ml   Filed Weights   11/28/21 0847  Weight: 45.4 kg    Examination: General: 65yo F. In bed c/o being cold. Increased WOB, need for increase in supplemental oxygen; HFNC 30L/100% and 15L NRB HENT: NCAT, no JVD Lungs: rhonchi bilateral upper lobes; diminished on right>left Cardiovascular: ST mild gallop at apical; HR 120-130's Abdomen: soft nontender Extremities: warm dry, BLE mottled Neuro: alert and oriented GU: voids  Resolved Hospital Problem list     Assessment & Plan:  Principal Problem:   CAP (community acquired pneumonia) Active Problems:   Sepsis ([place])   Weakness   Acute respiratory failure with hypoxia (HCC)   Elevated LFTs   Tobacco abuse   Risk for falls   Protein-calorie malnutrition, severe   Takotsubo cardiomyopathy    Acute respiratory failure with hypoxia 2/2 CAP as evident by CXR with bilateral pulmonary opacities. Fever on admission. Continued SOB, cough with mucous production, and generalized weakness. Increase in oxygen requirements with continues need for supplemental oxygen Plan BAL with respiratory cultures sent Respiratory cultures pending  Continue supplemental oxygen Continue IV Abx - Rocephin (2/13), Azithromycin (2/13) BC NGTD - continue to watch Urine legionella and strep pneumoniae pending Duoneb PRN Continue to trend CBC Pulmonary hygiene Continue use of IS and acapella devices Continuous cardiac and pulse ox monitoring Trend cooxemetry panel elevated 2/15 (<2.2) Continue to titrate oxygen to maintain sats>92%  Sepsis 2/2 CAP as evident by elevated lactic acid on admission; WBC trending up 17.5<16.5<13.3 Lactic Acid was down trending w/ IV fluids now back up trending 2.1>1.7<3.1 Plan  Continue to  trend Lactic acid level PRN Trend procalcitonin Continue IV Abx above Keep euvolemic  Vancomycin IV added (2/15) BC NGTD - continue to trend  Acute decompensated HFrEF (EF 30-35%) 2/2 stress  induced/Takotsubo cardiomyopathy caused by PNA infection   Elevated BNP 2/15 (1181.2) Plan Consulted Cardiology/HF team Supportive therapy Continuous pulse ox monitoring Diuresis PRN and as tolerated Trend BNP Monitor CVP Sodium restriction 2gm Consider repeat ECHO prior to discharge  Hypoalbuminemia. Multifactorial: HFrEF, protein-calorie malnutrition, PNA infection, ?liver failure,  Plan Supplemental IV fluids until increase in oral intake Supportive therapy Trend BMP  Elevated LFTs: (AST 234). ALT (74) Could be related to alcohol abuse or in setting of sepsis. No evidence thus far of immunocompromise, HIV, HBV or HCV. Plan Continue to trend LFTs Avoid hepatotoxic drugs for now  Fluid and Electrolyte imbalance: hyponatremia, hypokalemia Hyponatremia improved 2/15 (130) Plan Trend BMP Replace potassium PRN  Alcohol abuse Continue multi vitamin/thiamine/folic acid  Tobacco abuse Continue nicotine patch Smoking cessation education  Risk of falls POA 2/2 weakness Falls precaution  Best Practice (right click and "Reselect all SmartList Selections" daily)   Diet/type: Regular consistency (see orders) DVT prophylaxis: LMWH GI prophylaxis: PPI Lines: N/A Foley:  N/A Code Status:  full code Last date of multidisciplinary goals of care discussion _0   Labs   CBC: Recent Labs  Lab 11/28/21 0901 11/28/21 0914 11/29/21 0055  WBC 13.3*  --  16.5*  NEUTROABS 12.3*  --   --   HGB 13.7 13.9 13.2  HCT 38.1 41.0 37.5  MCV 101.1*  --  102.5*  PLT 177  --  390    Basic Metabolic Panel: Recent Labs  Lab 11/28/21 0901 11/28/21 0914 11/28/21 2125 11/29/21 0055 11/29/21 0414  NA 126* 125* 128* 130* 130*  K 3.5 3.4* 2.7* 5.2* 3.5  CL 91*  --  93* 99 96*  CO2 22  --  20* 17* 21*  GLUCOSE 159*  --  119* 92 92  BUN 16  --  _1 CREATININE 0.74  --  0.60 0.59 0.64  CALCIUM 8.2*  --  7.7* 7.7* 7.8*  MG 2.3  --   --  2.0  --    GFR: Estimated Creatinine  Clearance: 49.6 mL/min (by C-G formula based on SCr of 0.64 mg/dL). Recent Labs  Lab 11/28/21 0901 11/28/21 0916 11/28/21 1055 11/29/21 0055  WBC 13.3*  --   --  16.5*  LATICACIDVEN  --  3.1* 1.7  --     Liver Function Tests: Recent Labs  Lab 11/28/21 0901 11/29/21 0055 11/29/21 0414  AST 210* 220* 234*  ALT 71* 63* 74*  ALKPHOS 49 38 42  BILITOT 0.4 1.2 0.5  PROT 6.1* 4.7* 5.1*  ALBUMIN 2.5* 1.9* 2.1*   No results for input(s): LIPASE, AMYLASE in the last 168 hours. No results for input(s): AMMONIA in the last 168 hours.  ABG    Component Value Date/Time   HCO3 22.5 11/28/2021 0914   TCO2 23 11/28/2021 0914   ACIDBASEDEF 1.0 11/28/2021 0914   O2SAT 46.0 11/28/2021 0914     Coagulation Profile: Recent Labs  Lab 11/28/21 0948  INR 1.0    Cardiac Enzymes: Recent Labs  Lab 11/28/21 1400  CKTOTAL 2,245*    HbA1C: Hgb A1c MFr Bld  Date/Time Value Ref Range Status  11/28/2021 02:00 PM 5.5 4.8 - 5.6 % Final    Comment:    (NOTE) Pre diabetes:          5.7%-6.4%  Diabetes:              >6.4%  Glycemic control for   <7.0% adults with diabetes     CBG: No results for input(s): GLUCAP in the last 168 hours.  Review of Systems:   ***  Past Medical History:  She,  has no past medical history on file.   Surgical History:  History reviewed. No pertinent surgical history.   Social History:   reports that she has been smoking cigarettes. She has been smoking an average of .5 packs per day. She does not have any smokeless tobacco history on file. She reports current alcohol use. She reports that she does not use drugs.   Family History:  Her family history is not on file.   Allergies Allergies  Allergen Reactions   Codeine Other (See Comments)    Unknown reaction (possibly sick stomach)     Home Medications  Prior to Admission medications   Medication Sig Start Date End Date Taking? Authorizing Provider  acetaminophen (TYLENOL) 500 MG tablet  Take 500 mg by mouth every 6 (six) hours as needed for headache (pain).   Yes [provider]  alendronate (FOSAMAX) 70 MG tablet Take 70 mg by mouth every Saturday. 09/28/21  Yes [provider]  Cholecalciferol (VITAMIN D3) 50 MCG (2000 UT) TABS Take 2,000 Units by mouth at bedtime.   Yes [provider]  ibuprofen (ADVIL) 200 MG tablet Take 200 mg by mouth every 6 (six) hours as needed for headache (pain).   Yes [provider]  Multiple Vitamin (MULTIVITAMIN WITH MINERALS) TABS tablet Take 1 tablet by mouth at bedtime.   Yes [provider]  Pramoxine-HC (HYDROCORTISONE ACE-PRAMOXINE) 2.5-1 % CREA Apply 1 application topically 2 (two) times daily as needed (hemorrhoids). 08/29/21  Yes [provider]     Critical care time:

## 2021-11-29 NOTE — Progress Notes (Signed)
While charting the next bag of potassium, this RN noticed the patient's lab results had come back and the potassium went from 2.7 to 5.2. I paused the K+ and paged Elink. Lab results show slight hemolysis, plan to redraw lab work and wait for results before restarting the potassium runs.

## 2021-11-29 NOTE — Progress Notes (Signed)
° °  Echocardiogram 2D Echocardiogram has been performed.  Gina Cherry 11/29/2021, 11:16 AM

## 2021-11-30 ENCOUNTER — Inpatient Hospital Stay (HOSPITAL_COMMUNITY): Payer: Medicare HMO

## 2021-11-30 ENCOUNTER — Other Ambulatory Visit (HOSPITAL_COMMUNITY): Payer: Self-pay

## 2021-11-30 DIAGNOSIS — R7989 Other specified abnormal findings of blood chemistry: Secondary | ICD-10-CM | POA: Diagnosis not present

## 2021-11-30 DIAGNOSIS — A419 Sepsis, unspecified organism: Secondary | ICD-10-CM | POA: Diagnosis not present

## 2021-11-30 DIAGNOSIS — J189 Pneumonia, unspecified organism: Secondary | ICD-10-CM | POA: Diagnosis not present

## 2021-11-30 DIAGNOSIS — J9601 Acute respiratory failure with hypoxia: Secondary | ICD-10-CM | POA: Diagnosis not present

## 2021-11-30 DIAGNOSIS — I5181 Takotsubo syndrome: Secondary | ICD-10-CM

## 2021-11-30 DIAGNOSIS — R6521 Severe sepsis with septic shock: Secondary | ICD-10-CM

## 2021-11-30 DIAGNOSIS — Z789 Other specified health status: Secondary | ICD-10-CM

## 2021-11-30 DIAGNOSIS — E43 Unspecified severe protein-calorie malnutrition: Secondary | ICD-10-CM

## 2021-11-30 LAB — CBC
HCT: 35.1 % — ABNORMAL LOW (ref 36.0–46.0)
Hemoglobin: 12.8 g/dL (ref 12.0–15.0)
MCH: 36.1 pg — ABNORMAL HIGH (ref 26.0–34.0)
MCHC: 36.5 g/dL — ABNORMAL HIGH (ref 30.0–36.0)
MCV: 98.9 fL (ref 80.0–100.0)
Platelets: 251 10*3/uL (ref 150–400)
RBC: 3.55 MIL/uL — ABNORMAL LOW (ref 3.87–5.11)
RDW: 13 % (ref 11.5–15.5)
WBC: 17.5 10*3/uL — ABNORMAL HIGH (ref 4.0–10.5)
nRBC: 0 % (ref 0.0–0.2)

## 2021-11-30 LAB — COOXEMETRY PANEL
Carboxyhemoglobin: <2.2 % — ABNORMAL HIGH (ref 0.5–1.5)
Methemoglobin: 0.6 % (ref 0.0–1.5)
O2 Saturation: 67.5 %
Total hemoglobin: 13.9 g/dL (ref 12.0–16.0)

## 2021-11-30 LAB — COMPREHENSIVE METABOLIC PANEL
ALT: 70 U/L — ABNORMAL HIGH (ref 0–44)
ALT: 66 U/L — ABNORMAL HIGH (ref 0–44)
AST: 184 U/L — ABNORMAL HIGH (ref 15–41)
AST: 157 U/L — ABNORMAL HIGH (ref 15–41)
Albumin: 1.9 g/dL — ABNORMAL LOW (ref 3.5–5.0)
Albumin: 1.8 g/dL — ABNORMAL LOW (ref 3.5–5.0)
Alkaline Phosphatase: 46 U/L (ref 38–126)
Alkaline Phosphatase: 45 U/L (ref 38–126)
Anion gap: 9 (ref 5–15)
Anion gap: 10 (ref 5–15)
BUN: 8 mg/dL (ref 8–23)
BUN: 9 mg/dL (ref 8–23)
CO2: 24 mmol/L (ref 22–32)
CO2: 24 mmol/L (ref 22–32)
Calcium: 7.8 mg/dL — ABNORMAL LOW (ref 8.9–10.3)
Calcium: 7.3 mg/dL — ABNORMAL LOW (ref 8.9–10.3)
Chloride: 98 mmol/L (ref 98–111)
Chloride: 98 mmol/L (ref 98–111)
Creatinine, Ser: 0.53 mg/dL (ref 0.44–1.00)
Creatinine, Ser: 0.55 mg/dL (ref 0.44–1.00)
GFR, Estimated: >60 mL/min (ref >60)
GFR, Estimated: >60 mL/min (ref >60)
Glucose, Bld: 233 mg/dL — ABNORMAL HIGH (ref 70–99)
Glucose, Bld: 153 mg/dL — ABNORMAL HIGH (ref 70–99)
Potassium: 3.3 mmol/L — ABNORMAL LOW (ref 3.5–5.1)
Potassium: 3.9 mmol/L (ref 3.5–5.1)
Sodium: 131 mmol/L — ABNORMAL LOW (ref 135–145)
Sodium: 132 mmol/L — ABNORMAL LOW (ref 135–145)
Total Bilirubin: 0.2 mg/dL — ABNORMAL LOW (ref 0.3–1.2)
Total Bilirubin: 0.3 mg/dL (ref 0.3–1.2)
Total Protein: 4.7 g/dL — ABNORMAL LOW (ref 6.5–8.1)
Total Protein: 4.7 g/dL — ABNORMAL LOW (ref 6.5–8.1)

## 2021-11-30 LAB — POCT I-STAT 7, (LYTES, BLD GAS, ICA,H+H)
Acid-Base Excess: 1 mmol/L (ref 0.0–2.0)
Acid-Base Excess: 1 mmol/L (ref 0.0–2.0)
Acid-Base Excess: 2 mmol/L (ref 0.0–2.0)
Bicarbonate: 24.7 mmol/L (ref 20.0–28.0)
Bicarbonate: 27.7 mmol/L (ref 20.0–28.0)
Bicarbonate: 27 mmol/L (ref 20.0–28.0)
Calcium, Ion: 1.15 mmol/L (ref 1.15–1.40)
Calcium, Ion: 1.15 mmol/L (ref 1.15–1.40)
Calcium, Ion: 1.14 mmol/L — ABNORMAL LOW (ref 1.15–1.40)
HCT: 37 % (ref 36.0–46.0)
HCT: 32 % — ABNORMAL LOW (ref 36.0–46.0)
HCT: 32 % — ABNORMAL LOW (ref 36.0–46.0)
Hemoglobin: 12.6 g/dL (ref 12.0–15.0)
Hemoglobin: 10.9 g/dL — ABNORMAL LOW (ref 12.0–15.0)
Hemoglobin: 10.9 g/dL — ABNORMAL LOW (ref 12.0–15.0)
O2 Saturation: 83 %
O2 Saturation: 90 %
O2 Saturation: 96 %
Patient temperature: 100.8
Patient temperature: 99
Patient temperature: 100
Potassium: 3.2 mmol/L — ABNORMAL LOW (ref 3.5–5.1)
Potassium: 4.1 mmol/L (ref 3.5–5.1)
Potassium: 4.1 mmol/L (ref 3.5–5.1)
Sodium: 130 mmol/L — ABNORMAL LOW (ref 135–145)
Sodium: 132 mmol/L — ABNORMAL LOW (ref 135–145)
Sodium: 132 mmol/L — ABNORMAL LOW (ref 135–145)
TCO2: 26 mmol/L (ref 22–32)
TCO2: 29 mmol/L (ref 22–32)
TCO2: 28 mmol/L (ref 22–32)
pCO2 arterial: 36.7 mmHg (ref 32–48)
pCO2 arterial: 51.6 mmHg — ABNORMAL HIGH (ref 32–48)
pCO2 arterial: 47.3 mmHg (ref 32–48)
pH, Arterial: 7.44 (ref 7.35–7.45)
pH, Arterial: 7.339 — ABNORMAL LOW (ref 7.35–7.45)
pH, Arterial: 7.368 (ref 7.35–7.45)
pO2, Arterial: 49 mmHg — ABNORMAL LOW (ref 83–108)
pO2, Arterial: 64 mmHg — ABNORMAL LOW (ref 83–108)
pO2, Arterial: 90 mmHg (ref 83–108)

## 2021-11-30 LAB — BODY FLUID CELL COUNT WITH DIFFERENTIAL: Total Nucleated Cell Count, Fluid: 8 cu mm (ref 0–1000)

## 2021-11-30 LAB — LACTIC ACID, PLASMA
Lactic Acid, Venous: 2.1 mmol/L (ref 0.5–1.9)
Lactic Acid, Venous: 2.5 mmol/L (ref 0.5–1.9)

## 2021-11-30 LAB — GLUCOSE, CAPILLARY
Glucose-Capillary: 148 mg/dL — ABNORMAL HIGH (ref 70–99)
Glucose-Capillary: 162 mg/dL — ABNORMAL HIGH (ref 70–99)
Glucose-Capillary: 199 mg/dL — ABNORMAL HIGH (ref 70–99)

## 2021-11-30 LAB — CORTISOL: Cortisol, Plasma: 33 ug/dL

## 2021-11-30 LAB — TROPONIN I (HIGH SENSITIVITY): Troponin I (High Sensitivity): 267 ng/L (ref <18)

## 2021-11-30 LAB — STREP PNEUMONIAE URINARY ANTIGEN: Strep Pneumo Urinary Antigen: NEGATIVE

## 2021-11-30 LAB — PROCALCITONIN: Procalcitonin: 14 ng/mL

## 2021-11-30 LAB — BRAIN NATRIURETIC PEPTIDE: B Natriuretic Peptide: 1181.2 pg/mL — ABNORMAL HIGH (ref 0.0–100.0)

## 2021-11-30 MED ORDER — ADULT MULTIVITAMIN W/MINERALS CH
1.0000 | ORAL_TABLET | Freq: Every day | ORAL | Status: DC
  Administered 2021-12-01 – 2021-12-10 (×10): 1
  Filled 2021-11-30 (×10): qty 1

## 2021-11-30 MED ORDER — NOREPINEPHRINE 16 MG/250ML-% IV SOLN
0.0000 ug/min | INTRAVENOUS | Status: DC
  Administered 2021-11-30: 2 ug/min via INTRAVENOUS
  Administered 2021-12-01: 18 ug/min via INTRAVENOUS
  Administered 2021-12-02: 19 ug/min via INTRAVENOUS
  Administered 2021-12-03: 6 ug/min via INTRAVENOUS
  Administered 2021-12-03: 7 ug/min via INTRAVENOUS
  Filled 2021-11-30 (×3): qty 250

## 2021-11-30 MED ORDER — VANCOMYCIN HCL IN DEXTROSE 1-5 GM/200ML-% IV SOLN
1000.0000 mg | Freq: Once | INTRAVENOUS | Status: AC
Start: 2021-11-30 — End: 2021-11-30
  Administered 2021-11-30: 1000 mg via INTRAVENOUS
  Filled 2021-11-30: qty 200

## 2021-11-30 MED ORDER — FENTANYL 2500MCG IN NS 250ML (10MCG/ML) PREMIX INFUSION
25.0000 ug/h | INTRAVENOUS | Status: DC
  Administered 2021-11-30: 50 ug/h via INTRAVENOUS
  Filled 2021-11-30: qty 250

## 2021-11-30 MED ORDER — FENTANYL CITRATE PF 50 MCG/ML IJ SOSY
PREFILLED_SYRINGE | INTRAMUSCULAR | Status: AC
  Administered 2021-11-30: 50 ug
  Filled 2021-11-30: qty 2

## 2021-11-30 MED ORDER — ORAL CARE MOUTH RINSE
15.0000 mL | OROMUCOSAL | Status: DC
Start: 2021-11-30 — End: 2021-12-04
  Administered 2021-11-30 – 2021-12-04 (×43): 15 mL via OROMUCOSAL

## 2021-11-30 MED ORDER — CHLORHEXIDINE GLUCONATE 0.12% ORAL RINSE (MEDLINE KIT)
15.0000 mL | Freq: Two times a day (BID) | OROMUCOSAL | Status: DC
  Administered 2021-11-30 – 2021-12-04 (×8): 15 mL via OROMUCOSAL

## 2021-11-30 MED ORDER — SODIUM CHLORIDE 0.9 % IV BOLUS
1000.0000 mL | Freq: Once | INTRAVENOUS | Status: AC
Start: 2021-11-30 — End: 2021-11-30
  Administered 2021-11-30: 1000 mL via INTRAVENOUS

## 2021-11-30 MED ORDER — VANCOMYCIN HCL 750 MG/150ML IV SOLN
750.0000 mg | INTRAVENOUS | Status: DC
  Administered 2021-12-01: 750 mg via INTRAVENOUS
  Filled 2021-11-30: qty 150

## 2021-11-30 MED ORDER — MIDAZOLAM HCL 2 MG/2ML IJ SOLN
1.0000 mg | INTRAMUSCULAR | Status: DC | PRN
  Administered 2021-12-07: 1 mg via INTRAVENOUS
  Filled 2021-11-30: qty 2

## 2021-11-30 MED ORDER — ORAL CARE MOUTH RINSE
15.0000 mL | OROMUCOSAL | Status: DC
  Administered 2021-11-30: 15 mL via OROMUCOSAL

## 2021-11-30 MED ORDER — ROCURONIUM BROMIDE 10 MG/ML (PF) SYRINGE
PREFILLED_SYRINGE | INTRAVENOUS | Status: AC
  Administered 2021-11-30: 60 mg
  Filled 2021-11-30: qty 10

## 2021-11-30 MED ORDER — ETOMIDATE 2 MG/ML IV SOLN
INTRAVENOUS | Status: AC
  Administered 2021-11-30: 20 mg
  Filled 2021-11-30: qty 20

## 2021-11-30 MED ORDER — ACETAMINOPHEN 325 MG PO TABS
650.0000 mg | ORAL_TABLET | Freq: Four times a day (QID) | ORAL | Status: DC | PRN
  Administered 2021-11-30 – 2021-12-02 (×3): 650 mg
  Filled 2021-11-30 (×3): qty 2

## 2021-11-30 MED ORDER — DOCUSATE SODIUM 50 MG/5ML PO LIQD
100.0000 mg | Freq: Two times a day (BID) | ORAL | Status: DC
  Administered 2021-11-30 – 2021-12-08 (×16): 100 mg
  Filled 2021-11-30 (×19): qty 10

## 2021-11-30 MED ORDER — LACTATED RINGERS IV BOLUS: 500.0000 mL | Freq: Once | INTRAVENOUS | Status: DC

## 2021-11-30 MED ORDER — FOLIC ACID 1 MG PO TABS
1.0000 mg | ORAL_TABLET | Freq: Every day | ORAL | Status: DC
  Administered 2021-12-01 – 2021-12-10 (×10): 1 mg
  Filled 2021-11-30 (×10): qty 1

## 2021-11-30 MED ORDER — MIDAZOLAM HCL 2 MG/2ML IJ SOLN
INTRAMUSCULAR | Status: AC
  Administered 2021-11-30: 1 mg
  Filled 2021-11-30: qty 2

## 2021-11-30 MED ORDER — POTASSIUM CHLORIDE 10 MEQ/50ML IV SOLN
10.0000 meq | INTRAVENOUS | Status: AC
  Administered 2021-11-30 (×4): 10 meq via INTRAVENOUS
  Filled 2021-11-30: qty 50

## 2021-11-30 MED ORDER — SODIUM CHLORIDE 0.9 % IV SOLN: INTRAVENOUS | Status: DC | PRN

## 2021-11-30 MED ORDER — LACTATED RINGERS IV BOLUS
1000.0000 mL | Freq: Once | INTRAVENOUS | Status: AC
  Administered 2021-11-30: 1000 mL via INTRAVENOUS

## 2021-11-30 MED ORDER — FENTANYL BOLUS VIA INFUSION
25.0000 ug | INTRAVENOUS | Status: DC | PRN
  Filled 2021-11-30: qty 100

## 2021-11-30 MED ORDER — DEXMEDETOMIDINE HCL IN NACL 400 MCG/100ML IV SOLN
0.0000 ug/kg/h | INTRAVENOUS | Status: DC
  Administered 2021-11-30: 0.4 ug/kg/h via INTRAVENOUS
  Administered 2021-12-01: 0.7 ug/kg/h via INTRAVENOUS
  Filled 2021-11-30 (×3): qty 100

## 2021-11-30 MED ORDER — MIDAZOLAM HCL 2 MG/2ML IJ SOLN: 1.0000 mg | INTRAMUSCULAR | Status: DC | PRN

## 2021-11-30 MED ORDER — POLYETHYLENE GLYCOL 3350 17 G PO PACK
17.0000 g | PACK | Freq: Every day | ORAL | Status: DC
  Administered 2021-11-30 – 2021-12-08 (×8): 17 g
  Filled 2021-11-30 (×9): qty 1

## 2021-11-30 MED ORDER — THIAMINE HCL 100 MG PO TABS
100.0000 mg | ORAL_TABLET | Freq: Every day | ORAL | Status: DC
  Administered 2021-12-01 – 2021-12-10 (×10): 100 mg
  Filled 2021-11-30 (×10): qty 1

## 2021-11-30 MED ORDER — PHENYLEPHRINE 40 MCG/ML (10ML) SYRINGE FOR IV PUSH (FOR BLOOD PRESSURE SUPPORT)
PREFILLED_SYRINGE | INTRAVENOUS | Status: AC
  Administered 2021-11-30: 200 ug
  Filled 2021-11-30: qty 10

## 2021-11-30 MED ORDER — FENTANYL CITRATE PF 50 MCG/ML IJ SOSY: 25.0000 ug | PREFILLED_SYRINGE | Freq: Once | INTRAMUSCULAR | Status: AC

## 2021-11-30 NOTE — Procedures (Signed)
Central Venous Catheter Insertion Procedure Note  ALSIE YOUNES  [id number]  Dec 25, 1955  Date:11/30/21  Time:1:05 PM   Provider Performing:Pete Bea Laura Tanja Port   Procedure: Insertion of Non-tunneled Central Venous [phone]) with US guidance ([id number])   Indication(s) Medication administration and Difficult access  Consent Risks of the procedure as well as the alternatives and risks of each were explained to the patient and/or caregiver.  Consent for the procedure was obtained and is signed in the bedside chart  Anesthesia Topical only with 1% lidocaine   Timeout Verified patient identification, verified procedure, site/side was marked, verified correct patient position, special equipment/implants available, medications/allergies/relevant history reviewed, required imaging and test results available.  Sterile Technique Maximal sterile technique including full sterile barrier drape, hand hygiene, sterile gown, sterile gloves, mask, hair covering, sterile ultrasound probe cover (if used).  Procedure Description Area of catheter insertion was cleaned with chlorhexidine and draped in sterile fashion.  With real-time ultrasound guidance a central venous catheter was placed into the right internal jugular vein. Nonpulsatile blood flow and easy flushing noted in all ports.  The catheter was sutured in place and sterile dressing applied.  Complications/Tolerance None; patient tolerated the procedure well. Chest X-ray is ordered to verify placement for internal jugular or subclavian cannulation.   Chest x-ray is not ordered for femoral cannulation.  EBL Minimal  Specimen(s) None  Simonne Martinet ACNP-BC [facility] Pager # [phone] OR # [phone] if no answer

## 2021-11-30 NOTE — TOC Benefit Eligibility Note (Signed)
Patient Product/process development scientist completed.    The patient is currently admitted and upon discharge could be taking Clifton Custard & Jardiance.  The current 30 day co-pay for each is $47.   The patient is insured through [employer].    [place] Pink, CPhT Pharmacy Patient Advocate Specialist [facility] Patient Advocate Team Direct Number: [phone]  Fax: [phone]

## 2021-11-30 NOTE — Progress Notes (Signed)
NAME:  Gina Cherry, MRN:  [id number], DOB:  June 18, 1956, LOS: 1 ADMISSION DATE:  11/28/2021, CONSULTATION DATE:  11/30/21 REFERRING MD:  Earlie Raveling, CHIEF COMPLAINT:   worsening hypoxia/dyspnea  History of Present Illness:  Gina Cherry is a 66 y.o. F with PMH of tobacco and ETOH use who presented with 5-6 days of fatigue and poor appetite and diarrhea with mild coughing.  She tried to get out of bed and fell, hitting her head which prompted her significant other to call EMS.  She was found to have bilateral pneumonia with WBC 13k and mildly elevated LFT' s along with hypokalemia.  She was admitted to the floor and given 3L IVF, she initially required 4L [place].   Her oxygen requirement gradually increased and she was transitioned to maximal HFNC, therefore PCCM consulted.   Pt states that she feels better tonight, though is having to sit forward to breath comfortably.  She has been smoking 1/2 ppd for 40+ years, no diagnosis of emphysema or COPD and denies shortness of breath on a normal basis. Repeat CXR with mildly progressing bilateral opacities.   Pertinent  Medical History  Tobacco and ETOH use    Significant Hospital Events: Including procedures, antibiotic start and stop dates in addition to other pertinent events   2/13 admitted to teaching service, later required HFNC and ICU trxfr, On Vanc/Ceftriaxone/Azithromycin 2/14 wbc ct up. Vancs stopped as MRSA PCR neg Left Ventricle: There is moderately reduced LV function with regional  wall motion abnormalities, 30-35%. There is hypokinesis of the basal to  mid segments of all walls of the LV. There is apical hyperkinesis. This pattern could represent an atypical  stress induced cardiomyopathy pattern as this spares the apex  (non-coronary distributions). Left ventricular ejection fraction, by  estimation, is 30 to 35%. The left ventricle has moderately decreased function. The left ventricle demonstrates regional  wall motion  abnormalities.  2/15 increased O2 needs. CXR worse. Mottled. Concern for shock. Vanc added back. Right IJ CVL placed. Right radial aline placed. Cards consulted.   Interim History / Subjective:  Increased WOB more mottled  Objective   Blood pressure (Abnormal) 108/57, pulse 100, temperature 99.4 F (37.4 C), temperature source Axillary, resp. rate 12, height 5\' 4"  (1.626 m), weight 45.4 kg, SpO2 96 %.    FiO2 (%):  [90 %-100 %] 100 %   Intake/Output Summary (Last 24 hours) at 11/30/2021 0254 Last data filed at 11/30/2021 0800 Gross per 24 hour  Intake 2814.99 ml  Output 500 ml  Net 2314.99 ml   Filed Weights   11/28/21 0847  Weight: 45.4 kg   General this is a 66 year old female who appears more critically ill this am  HENT NCAT no JVD MMM (neck veins are flat by US imaging)  Pulm crackles on left. Decreased on right. Now w/ both heated high flow and 100% NRB> CXR: sig worsening right sided airspace disease. Some new air space disease on left Card tachy rrr Abd soft Ext warm but mottled. Pulses are thready scattered areas of ecchymosis  Neuro awake and oriented no focal def.  GU clear yellow   Resolved Hospital Problem list     Assessment & Plan:  Principal Problem:   CAP (community acquired pneumonia) Active Problems:   Acute respiratory failure with hypoxia (HCC)   Sepsis (HCC)   Weakness   Elevated LFTs   Tobacco abuse   Risk for falls   Protein-calorie malnutrition, severe   Takotsubo cardiomyopathy  Acute Hypoxic Respiratory Failure secondary to bilateral PNA Long term tobacco use -O2 needs worse.  Plan Cont supplemental oxygen  Cont to trend PCT  Day 3 rocephin and azith  Day 1 vanc (resuming d/t clinical decline) IS   New Takotsubo CM in setting of sepsis 2/2 above Lactate cleared initially. Now clinically declined w/ increased O2 requirements and mottled  Plan Keep euvolemic  Ck CVP  Stat lactate and CO-ox Place aline Cards consult IF lactate  elevated and scvo2 < 70 will start milrinone   H/o tobacco abuse Plan Cont patch   Fluid & Electrolyte imbalance: Hyponatremia, hypokalemia Na improved  Plan Replace K  Daily assessment   Elevated LFT's Alcohol Use Acute hepatitis panel neg; slowly improving  Plan Cont thiamine, folate and MV Cont to watch for evidence of wd   Fall risk Plan  PT consult    Best Practice (right click and "Reselect all SmartList Selections" daily)   Diet/type: NPO w/ oral meds DVT prophylaxis: LMWH GI prophylaxis: N/A Lines: Central line Foley:  N/A Code Status:  full code Last date of multidisciplinary goals of care discussion [Discussed intubation with patient and SO, would want full code]  My cct 43 minutes.   Erick Colace ACNP-BC [place] Pager # [phone] OR # [phone] if no answer

## 2021-11-30 NOTE — Progress Notes (Signed)
eLink Physician-Brief Progress Note Patient Name: Gina Cherry DOB: [birth year] MRN: [id number]   Date of Service  11/30/2021  HPI/Events of Note  Hypoxia - ABG on HFNC = 7.44/36/49/24 with sat = 83%. RT changed FiO2 to 100% HFNC + 15 L NRB. Sat now 92-93% by oximetry. RR = 17.  eICU Interventions  Plan: Follow clinical status closely for need for intubation and mechanical ventilation.      Intervention Category Major Interventions: Hypoxemia - evaluation and management  Dia Jefferys [place] 11/30/2021, 5:25 AM

## 2021-11-30 NOTE — Procedures (Signed)
Bronchoscopy Procedure Note  Gina Cherry  [id number]  [birth year]  Date:11/30/21  Time:1:33 PM   Provider Performing:[name] [name]   Procedure(s):  Flexible bronchoscopy with bronchial alveolar lavage (75170)  Indication(s) Respiratory failure  Consent Risks of the procedure as well as the alternatives and risks of each were explained to the patient and/or caregiver.  Consent for the procedure was obtained and is signed in the bedside chart  Anesthesia Etomidate, versed, fentanyl, rocuronium   Time Out Verified patient identification, verified procedure, site/side was marked, verified correct patient position, special equipment/implants available, medications/allergies/relevant history reviewed, required imaging and test results available.   Sterile Technique Usual hand hygiene, masks, gowns, and gloves were used   Procedure Description Bronchoscope advanced through endotracheal tube and into airway.  Airways were examined down to subsegmental level with findings noted below.   Following diagnostic evaluation, BAL(s) performed in RML with normal saline and return of 35cc cellular fluid  Findings: normal right bronchial tree. BAL taken from RML.    Complications/Tolerance None; patient tolerated the procedure well. Chest X-ray is not needed post procedure.   EBL Minimal   Specimen(s) BAL sent for gram stain culture cell count and cytology  Lenice Llamas, MD Pulmonary and [place] 11/30/2021 1:35 PM Pager: see AMION  If no response to pager, please call critical care on call (see AMION) until 7pm After 7:00 pm call Elink

## 2021-11-30 NOTE — Progress Notes (Signed)
Pharmacy Antibiotic Note  Gina Cherry is a 66 y.o. female admitted on 11/28/2021 with pneumonia.  Pharmacy has been consulted for vancomycin dosing. Previuosly received vancomycin x 1 dose on 2/13. Also on ceftriaxone/azithro per MD. SCr 0.53 stable.  Plan: Vancomycin 1g IV x 1; then 750mg  IV q24h. Goal AUC 400-550. Expected AUC: 504 SCr used: 0.8 Ceftriaxone 2g IV q24h Azithromycin 500mg  IV q24h Monitor clinical progress, c/s, renal function F/u de-escalation plan/LOT, vancomycin levels as indicated   Height: 5\' 4"  (162.6 cm) Weight: 45.4 kg (100 lb) IBW/kg (Calculated) : 54.7  Temp (24hrs), Avg:99.8 F (37.7 C), Min:98 F (36.7 C), Max:103.1 F (39.5 C)  Recent Labs  Lab 11/28/21 0901 11/28/21 0916 11/28/21 1055 11/28/21 2125 11/29/21 0055 11/29/21 0414 11/30/21 0646  WBC 13.3*  --   --   --  16.5*  --  17.5*  CREATININE 0.74  --   --  0.60 0.59 0.64 0.53  LATICACIDVEN  --  3.1* 1.7  --   --   --   --     Estimated Creatinine Clearance: 49.6 mL/min (by C-G formula based on SCr of 0.53 mg/dL).    Allergies  Allergen Reactions   Codeine Other (See Comments)    Unknown reaction (possibly sick stomach)    12/01/21, PharmD, BCPS Please check AMION for all [facility] contact numbers Clinical Pharmacist 11/30/2021 9:16 AM

## 2021-11-30 NOTE — Procedures (Signed)
Arterial Catheter Insertion Procedure Note  KASHAE Cherry  [id number]  [birth year]  Date:11/30/21  Time:9:47 AM    Provider Performing: Rosalita Levan    Procedure: Insertion of Arterial Line (03159) without US guidance  Indication(s) Blood pressure monitoring and/or need for frequent ABGs  Consent Risks of the procedure as well as the alternatives and risks of each were explained to the patient and/or caregiver.  Consent for the procedure was obtained and is signed in the bedside chart  Anesthesia None   Time Out Verified patient identification, verified procedure, site/side was marked, verified correct patient position, special equipment/implants available, medications/allergies/relevant history reviewed, required imaging and test results available.   Sterile Technique Maximal sterile technique including full sterile barrier drape, hand hygiene, sterile gown, sterile gloves, mask, hair covering, sterile ultrasound probe cover (if used).   Procedure Description Area of catheter insertion was cleaned with chlorhexidine and draped in sterile fashion. Without real-time ultrasound guidance an arterial catheter was placed into the left radial artery.  Appropriate arterial tracings confirmed on monitor.     Complications/Tolerance None; patient tolerated the procedure well.   EBL Minimal   Specimen(s) None

## 2021-11-30 NOTE — Progress Notes (Signed)
RT increased fio2 on HFNC to 100% and placed a 15L NRB on patient per ABG results. Spo2 increased to 90-91%. RT will continue to monitor.

## 2021-11-30 NOTE — Progress Notes (Addendum)
°  Transition of Care [facility]) Screening Note   Patient Details  Name: Gina Cherry Date of Birth: [birth year]   Transition of Care [facility] CM/SW Contact:    Claudetta Sallie, LCSW Phone Number: 11/30/2021, 4:07 PM    Transition of Care Department [place]) has reviewed patient and no TOC needs have been identified at this time. We will continue to monitor patient advancement through interdisciplinary progression rounds. If new patient transition needs arise, please place a TOC consult. CSW to address SA consult once Pt is no longer intubated.

## 2021-11-30 NOTE — Procedures (Signed)
Intubation Procedure Note  Gina Cherry  [id number]  10-09-1956  Date:11/30/21  Time:1:06 PM   Provider Performing:Pete E Kary Kos    Procedure: Intubation ([id number])  Indication(s) Respiratory Failure  Consent Risks of the procedure as well as the alternatives and risks of each were explained to the patient and/or caregiver.  Consent for the procedure was obtained and is signed in the bedside chart   Anesthesia Etomidate, Versed, Fentanyl, and Rocuronium   Time Out Verified patient identification, verified procedure, site/side was marked, verified correct patient position, special equipment/implants available, medications/allergies/relevant history reviewed, required imaging and test results available.   Sterile Technique Usual hand hygeine, masks, and gloves were used   Procedure Description Patient positioned in bed supine.  Sedation given as noted above.  Patient was intubated with endotracheal tube using Glidescope.  View was Grade 2 only posterior commissure .  Number of attempts was 1.  Colorimetric CO2 detector was consistent with tracheal placement.   Complications/Tolerance None; patient tolerated the procedure well. Chest X-ray is ordered to verify placement.   EBL Minimal   Specimen(s) None  Erick Colace ACNP-BC [place] Pager # [phone] OR # [phone] if no answer

## 2021-11-30 NOTE — Progress Notes (Signed)
eLink Physician-Brief Progress Note Patient Name: Gina Cherry DOB: 03-Jan-1956 MRN: [id number]   Date of Service  11/30/2021  HPI/Events of Note  Nursing concerned about Sat by oximetry in high 80's. However, the pleth tracing waveform does not look good.   eICU Interventions  Plan: ABG at 5 AM.      Intervention Category Major Interventions: Delirium, psychosis, severe agitation - evaluation and management  [name] [place] 11/30/2021, 2:56 AM

## 2021-11-30 NOTE — Consult Note (Addendum)
Advanced Heart Failure Team Consult Note   Primary Physician: Vania Rea, MD PCP-Cardiologist:  None  Reason for Consultation: Acute Systolic Heart Failure and Shock   HPI:    Gina Cherry is seen today for evaluation of acute systolic heart failure and shock at the request of Dr. [name], PCCM.   66 h/o female w/ h/o tobacco and ETOH abuse (drinks ~4 glasses of wine/day), no prior cardiac history. Admitted w/ sepsis PNA and found to have reduced LV dysfunction on echo.   Was in her usual state of health until ~3-4 days prior to admit. Developed generalized malaise, weakness and cough and poor PO intake, subsequently got weak and fell at home, hitting her head, prompting ED evaluation.   W/u in ED c/w sepsis criteria. Hypoxic requiring HFNC, febrile w/ temp 103.1, leukocytosis w/ WBC of 17K. Lactic acid 3.1. CXR w/ b/l PNA. COVID and Flu negative. AST 210, ALT 71. SCr normal 0.74. Na 126.  She was admitted to ICU and started on braod spectrum abx. Blood cx negative. Strep pneumo and legionella test pending.   As part of w/u, echo was obtained and showed moderately reduced LVEF 30-35%, w/ HK of the basal to mid segments of all walls of the LV + apical hyperkinesis. Pattern c/w possible atypical stress induced CM. RV normal. + small pericardial effusion. No tamponade.   There was concern for potential developing cardiogenic shock given patient condition during am rounds by primary team. Still hypoxic w/ more cool and mottled looking extremities on exam. Subsequently, CVC was placed to check Co-ox.   Co-ox normal 68%, CVP 6-7   Still SOB and remains on HFNC. Denies any recent h/o ischemic chest pain. She reports her father had "heart issues" but unsure if he had actual heart failure.     Echo 11/29/21  There is moderately reduced LV function with regional wall motion abnormalities, 30-35%. There is hypokinesis of the basal to mid segments of all walls of the LV. There is  apical hyperkinesis. This pattern could represent an atypical stress induced cardiomyopathy pattern as this spares the apex (non-coronary distributions). Left ventricular ejection fraction, by estimation, is 30 to 35%. The left ventricle has moderately decreased function. The left ventricle demonstrates regional wall motion abnormalities (see scoring diagram/findings for description). Indeterminate diastolic filling due to E-A fusion. 1. Right ventricular systolic function is normal. The right ventricular size is normal. Tricuspid regurgitation signal is inadequate for assessing PA pressure. 2. A small pericardial effusion is present. The pericardial effusion is circumferential. There is no evidence of cardiac tamponade. 3. The mitral valve is grossly normal. Mild mitral valve regurgitation. No evidence of mitral stenosis. 4. Nodular calcium noted attached to the [place]. No regurgitation. Does not appear to represent vegetation. The aortic valve is tricuspid. There is mild calcification of the aortic valve. Aortic valve regurgitation is not visualized. No aortic stenosis is present. 5. The inferior vena cava is normal in size with greater than 50% respiratory variability, suggesting right atrial pressure of 3 mmHg.  Review of Systems: [y] = yes, [ ]  = no   General: Weight gain [ ] ; Weight loss [ ] ; Anorexia [ ] ; Fatigue [ Y]; Fever [ ] ; Chills [ ] ; Weakness [ ]   Cardiac: Chest pain/pressure [ ] ; Resting SOB [ Y]; Exertional SOB [ ] ; Orthopnea [ ] ; Pedal Edema [ ] ; Palpitations [ ] ; Syncope [ ] ; Presyncope [Y ]; Paroxysmal nocturnal dyspnea[ ]   Pulmonary: Cough [ Y]; Wheezing[ ] ; Hemoptysis[ ] ;  Sputum [ ] ; Snoring [ ]   GI: Vomiting[ ] ; Dysphagia[ ] ; Melena[ ] ; Hematochezia [ ] ; Heartburn[ ] ; Abdominal pain [ ] ; Constipation [ ] ; Diarrhea [ Y]; BRBPR [ ]   GU: Hematuria[ ] ; Dysuria [ ] ; Nocturia[ ]   Vascular: Pain in legs with walking [ ] ; Pain in feet with lying flat [ ] ; Non-healing sores [  ]; Stroke [ ] ; TIA [ ] ; Slurred speech [ ] ;  Neuro: Headaches[ ] ; Vertigo[ ] ; Seizures[ ] ; Paresthesias[ ] ;Blurred vision [ ] ; Diplopia [ ] ; Vision changes [ ]   Ortho/Skin: Arthritis [ ] ; Joint pain [ ] ; Muscle pain [ ] ; Joint swelling [ ] ; Back Pain [ ] ; Rash [ ]   Psych: Depression[ ] ; Anxiety[ ]   Heme: Bleeding problems [ ] ; Clotting disorders [ ] ; Anemia [ ]   Endocrine: Diabetes [ ] ; Thyroid dysfunction[ ]   Home Medications Prior to Admission medications   Medication Sig Start Date End Date Taking? Authorizing Provider  acetaminophen (TYLENOL) 500 MG tablet Take 500 mg by mouth every 6 (six) hours as needed for headache (pain).   Yes [provider]  alendronate (FOSAMAX) 70 MG tablet Take 70 mg by mouth every Saturday. 09/28/21  Yes [provider]  Cholecalciferol (VITAMIN D3) 50 MCG (2000 UT) TABS Take 2,000 Units by mouth at bedtime.   Yes [provider]  ibuprofen (ADVIL) 200 MG tablet Take 200 mg by mouth every 6 (six) hours as needed for headache (pain).   Yes [provider]  Multiple Vitamin (MULTIVITAMIN WITH MINERALS) TABS tablet Take 1 tablet by mouth at bedtime.   Yes [provider]  Pramoxine-HC (HYDROCORTISONE ACE-PRAMOXINE) 2.5-1 % CREA Apply 1 application topically 2 (two) times daily as needed (hemorrhoids). 08/29/21  Yes [provider]    Past Medical History: History reviewed. No pertinent past medical history.  Past Surgical History: History reviewed. No pertinent surgical history.  Family History: No family history on file.  Social History: Social History   Socioeconomic History   Marital status: Single    Spouse name: Not on file   Number of children: Not on file   Years of education: Not on file   Highest education level: Not on file  Occupational History   Not on file  Tobacco Use   Smoking status: Every Day    Packs/day: 0.50    Types: Cigarettes   Smokeless tobacco: Not on file  Substance  and Sexual Activity   Alcohol use: Yes    Comment: couple glasses of wine a day   Drug use: Never   Sexual activity: Not on file  Other Topics Concern   Not on file  Social History Narrative   Not on file   Social Determinants of Health   Financial Resource Strain: Not on file  Food Insecurity: Not on file  Transportation Needs: Not on file  Physical Activity: Not on file  Stress: Not on file  Social Connections: Not on file    Allergies:  Allergies  Allergen Reactions   Codeine Other (See Comments)    Unknown reaction (possibly sick stomach)    Objective:    Vital Signs:   Temp:  [98 F (36.7 C)-103.1 F (39.5 C)] 99.4 F (37.4 C) (02/15 0800) Pulse Rate:  [98-138] 100 (02/15 0800) Resp:  [12-26] 12 (02/15 0800) BP: (89-156)/(53-97) 108/57 (02/15 0800) SpO2:  [81 %-99 %] 96 % (02/15 0822) FiO2 (%):  [90 %-100 %] 100 % (02/15 0822) Last BM Date : 11/29/21  Weight change: Danley Danker  Weights   11/28/21 0847  Weight: 45.4 kg    Intake/Output:   Intake/Output Summary (Last 24 hours) at 11/30/2021 1021 Last data filed at 11/30/2021 0800 Gross per 24 hour  Intake 2814.99 ml  Output 500 ml  Net 2314.99 ml      Physical Exam    CVP 6  General:  thin frail appearing WF, looks much older than actual age. No resp difficulty HEENT: normal Neck: supple. JVP not elevated .  Rt IJ CVC, Carotids 2+ bilat; no bruits. No lymphadenopathy or thyromegaly appreciated. Cor: PMI nondisplaced. Regular rhythm and tachy rate. No rubs, gallops or murmurs. Lungs: b/l crackles  Abdomen: soft, nontender, nondistended. No hepatosplenomegaly. No bruits or masses. Good bowel sounds. Extremities: no cyanosis, clubbing, rash, no edema, warm distal extremities  Neuro: alert & orientedx3, cranial nerves grossly intact. moves all 4 extremities w/o difficulty. Affect pleasant   Telemetry   Sinus tach vs AFL w/ RVR 120s   EKG    Admit EKG ST vs Atypical atrial flutter 130s   Labs    Basic Metabolic Panel: Recent Labs  Lab 11/28/21 0901 11/28/21 0914 11/28/21 2125 11/29/21 0055 11/29/21 0414 11/30/21 0356 11/30/21 0646  NA 126*   < > 128* 130* 130* 130* 131*  K 3.5   < > 2.7* 5.2* 3.5 3.2* 3.3*  CL 91*  --  93* 99 96*  --  98  CO2 22  --  20* 17* 21*  --  24  GLUCOSE 159*  --  119* 92 92  --  233*  BUN 16  --  11 11 10   --  8  CREATININE 0.74  --  0.60 0.59 0.64  --  0.53  CALCIUM 8.2*  --  7.7* 7.7* 7.8*  --  7.8*  MG 2.3  --   --  2.0  --   --   --    < > = values in this interval not displayed.    Liver Function Tests: Recent Labs  Lab 11/28/21 0901 11/29/21 0055 11/29/21 0414 11/30/21 0646  AST 210* 220* 234* 184*  ALT 71* 63* 74* 70*  ALKPHOS 49 38 42 46  BILITOT 0.4 1.2 0.5 0.2*  PROT 6.1* 4.7* 5.1* 4.7*  ALBUMIN 2.5* 1.9* 2.1* 1.9*   No results for input(s): LIPASE, AMYLASE in the last 168 hours. No results for input(s): AMMONIA in the last 168 hours.  CBC: Recent Labs  Lab 11/28/21 0901 11/28/21 0914 11/29/21 0055 11/30/21 0356 11/30/21 0646  WBC 13.3*  --  16.5*  --  17.5*  NEUTROABS 12.3*  --   --   --   --   HGB 13.7 13.9 13.2 12.6 12.8  HCT 38.1 41.0 37.5 37.0 35.1*  MCV 101.1*  --  102.5*  --  98.9  PLT 177  --  157  --  251    Cardiac Enzymes: Recent Labs  Lab 11/28/21 1400  CKTOTAL 2,245*    BNP: BNP (last 3 results) Recent Labs    11/30/21 0828  BNP 1,181.2*    ProBNP (last 3 results) No results for input(s): PROBNP in the last 8760 hours.   CBG: No results for input(s): GLUCAP in the last 168 hours.  Coagulation Studies: Recent Labs    11/28/21 0948  LABPROT 12.6  INR 1.0     Imaging   DG Chest Port 1 View  Result Date: 11/30/2021 CLINICAL DATA:  [place].  Placement. EXAM: PORTABLE CHEST 1 VIEW COMPARISON:  November 30, 2021 at 523 hours. FINDINGS: Right central venous catheter with tip overlying the superior cavoatrial junction. No visible pneumothorax. Similar confluent  opacification of the right mid/lower lung with air bronchograms. Probable small right pleural effusion appears similar. Similar reticulonodular opacity in the left mid lung suspicious for early infectious involvement. The heart size and mediastinal contours is partially obscured but appears unchanged. The visualized skeletal structures are unchanged. IMPRESSION: 1. Right central venous catheter with tip overlying the superior cavoatrial junction, with indication of A-line placement. If this was attempted arterial line insertion recommend correlation with ABGs. No visible pneumothorax. 2. Similar confluent opacification of the right mid/lower lung with air bronchograms and probable small right pleural effusion. 3. No significant interval change in the reticulonodular left mid lung opacities. Electronically Signed   By: Dahlia Bailiff M.D.   On: 11/30/2021 09:53   DG Chest Port 1 View  Result Date: 11/30/2021 CLINICAL DATA:  66 year old female with sepsis.  Pneumonia. EXAM: PORTABLE CHEST 1 VIEW COMPARISON:  Portable chest 11/28/2021 and earlier. FINDINGS: Portable AP semi upright view at 0523 hours. Progressed and now confluent opacification throughout the right mid and lower lung. Increased lower lobe air bronchograms near the hilum. Possible superimposed component of pleural fluid. Stable lung volumes. Left perihilar and peripheral lung reticular opacity is stable and might be early infection. No pneumothorax. No left lung pleural effusion. Stable cardiac size and mediastinal contours. Calcified aortic atherosclerosis. Visualized tracheal air column is within normal limits. No acute osseous abnormality identified. Negative visible bowel gas. IMPRESSION: 1. Worsening opacification of the right mid and lower lung compatible with progressed pneumonia and possible associated right pleural effusion now. 2. Reticulonodular opacity in the left mid lung suspicious for early infectious involvement. Electronically Signed    By: Genevie Ann M.D.   On: 11/30/2021 06:54   ECHOCARDIOGRAM COMPLETE  Result Date: 11/29/2021    ECHOCARDIOGRAM REPORT   Patient Name:   KELEIGH STBERNARD [facility] Date of Exam: 11/29/2021 Medical Rec #:  [id number]           Height:       64.0 in Accession #:    [id number]          Weight:       100.0 lb Date of Birth:  Apr 27, 1956           BSA:          1.457 m Patient Age:    38 years            BP:           143/82 mmHg Patient Gender: F                   HR:           112 bpm. Exam Location:  Inpatient Procedure: 2D Echo, Cardiac Doppler and Color Doppler Indications:    R50.9 FEVER  History:        Patient has no prior history of Echocardiogram examinations. NO                 HISTORY.  Sonographer:    Beryle Beams Referring Phys: [id number] [place]  1. There is moderately reduced LV function with regional wall motion abnormalities, 30-35%. There is hypokinesis of the basal to mid segments of all walls of the LV. There is apical hyperkinesis. This pattern could represent an atypical stress induced cardiomyopathy pattern as this spares the apex (non-coronary distributions). Left ventricular ejection  fraction, by estimation, is 30 to 35%. The left ventricle has moderately decreased function. The left ventricle demonstrates regional wall motion abnormalities (see scoring diagram/findings for description). Indeterminate diastolic filling due to E-A fusion.  2. Right ventricular systolic function is normal. The right ventricular size is normal. Tricuspid regurgitation signal is inadequate for assessing PA pressure.  3. A small pericardial effusion is present. The pericardial effusion is circumferential. There is no evidence of cardiac tamponade.  4. The mitral valve is grossly normal. Mild mitral valve regurgitation. No evidence of mitral stenosis.  5. Nodular calcium noted attached to the [place]. No regurgitation. Does not appear to represent vegetation. The aortic valve is tricuspid. There is mild  calcification of the aortic valve. Aortic valve regurgitation is not visualized. No aortic stenosis is present.  6. The inferior vena cava is normal in size with greater than 50% respiratory variability, suggesting right atrial pressure of 3 mmHg. FINDINGS  Left Ventricle: There is moderately reduced LV function with regional wall motion abnormalities, 30-35%. There is hypokinesis of the basal to mid segments of all walls of the LV. There is apical hyperkinesis. This pattern could represent an atypical stress induced cardiomyopathy pattern as this spares the apex (non-coronary distributions). Left ventricular ejection fraction, by estimation, is 30 to 35%. The left ventricle has moderately decreased function. The left ventricle demonstrates regional wall motion abnormalities. The left ventricular internal cavity size was normal in size. There is no left ventricular hypertrophy. Indeterminate diastolic filling due to E-A fusion. Right Ventricle: The right ventricular size is normal. No increase in right ventricular wall thickness. Right ventricular systolic function is normal. Tricuspid regurgitation signal is inadequate for assessing PA pressure. Left Atrium: Left atrial size was normal in size. Right Atrium: Right atrial size was normal in size. Pericardium: A small pericardial effusion is present. The pericardial effusion is circumferential. There is no evidence of cardiac tamponade. Mitral Valve: The mitral valve is grossly normal. Mild mitral valve regurgitation. No evidence of mitral valve stenosis. Tricuspid Valve: The tricuspid valve is grossly normal. Tricuspid valve regurgitation is not demonstrated. No evidence of tricuspid stenosis. Aortic Valve: Nodular calcium noted attached to the [place]. No regurgitation. Does not appear to represent vegetation. The aortic valve is tricuspid. There is mild calcification of the aortic valve. Aortic valve regurgitation is not visualized. No aortic stenosis is present.  Aortic valve mean gradient measures 4.0 mmHg. Aortic valve peak gradient measures 6.6 mmHg. Aortic valve area, by VTI measures 1.77 cm. Pulmonic Valve: The pulmonic valve was grossly normal. Pulmonic valve regurgitation is not visualized. No evidence of pulmonic stenosis. Aorta: The aortic root and ascending aorta are structurally normal, with no evidence of dilitation. Venous: The inferior vena cava is normal in size with greater than 50% respiratory variability, suggesting right atrial pressure of 3 mmHg. IAS/Shunts: The atrial septum is grossly normal.  LEFT VENTRICLE PLAX 2D LVIDd:         3.60 cm     Diastology LVIDs:         2.20 cm     LV e' medial:    6.53 cm/s LV PW:         0.90 cm     LV E/e' medial:  8.5 LV IVS:        0.70 cm     LV e' lateral:   10.90 cm/s LVOT diam:     1.70 cm     LV E/e' lateral: 5.1 LV SV:  33 LV SV Index:   23 LVOT Area:     2.27 cm  LV Volumes (MOD) LV vol d, MOD A2C: 35.1 ml LV vol d, MOD A4C: 47.9 ml LV vol s, MOD A2C: 15.4 ml LV vol s, MOD A4C: 21.3 ml LV SV MOD A2C:     19.7 ml LV SV MOD A4C:     47.9 ml LV SV MOD BP:      24.0 ml RIGHT VENTRICLE             IVC RV S prime:     50.00 cm/s  IVC diam: 1.90 cm TAPSE (M-mode): 1.5 cm LEFT ATRIUM             Index        RIGHT ATRIUM           Index LA diam:        2.20 cm 1.51 cm/m   RA Area:     12.80 cm LA Vol (A2C):   22.9 ml 15.72 ml/m  RA Volume:   29.30 ml  20.11 ml/m LA Vol (A4C):   13.4 ml 9.20 ml/m LA Biplane Vol: 18.5 ml 12.70 ml/m  AORTIC VALVE                    PULMONIC VALVE AV Area (Vmax):    1.88 cm     PV Vmax:       0.73 m/s AV Area (Vmean):   1.72 cm     PV Vmean:      51.000 cm/s AV Area (VTI):     1.77 cm     PV VTI:        0.118 m AV Vmax:           128.00 cm/s  PV Peak grad:  2.2 mmHg AV Vmean:          89.800 cm/s  PV Mean grad:  1.0 mmHg AV VTI:            0.189 m AV Peak Grad:      6.6 mmHg AV Mean Grad:      4.0 mmHg LVOT Vmax:         106.00 cm/s LVOT Vmean:        68.100 cm/s LVOT  VTI:          0.147 m LVOT/AV VTI ratio: 0.78  AORTA Ao Root diam: 2.60 cm Ao Asc diam:  2.30 cm MITRAL VALVE MV Area (PHT): 4.10 cm    SHUNTS MV Decel Time: 185 msec    Systemic VTI:  0.15 m MV E velocity: 55.80 cm/s  Systemic Diam: 1.70 cm MV A velocity: 58.60 cm/s MV E/A ratio:  0.95 Eleonore Chiquito MD Electronically signed by Eleonore Chiquito MD Signature Date/Time: 11/29/2021/12:00:15 PM    Final      Medications:     Current Medications:  chlorhexidine  15 mL Mouth Rinse BID   Chlorhexidine Gluconate Cloth  6 each Topical Q0600   enoxaparin (LOVENOX) injection  30 mg Subcutaneous Q24H   feeding supplement  237 mL Oral TID BM   folic acid  1 mg Oral Daily   ipratropium-albuterol  3 mL Nebulization TID   mouth rinse  15 mL Mouth Rinse q12n4p   multivitamin with minerals  1 tablet Oral Daily   nicotine  21 mg Transdermal Daily   thiamine  100 mg Oral Daily   Or   thiamine  100 mg Intravenous Daily  Infusions:  sodium chloride     azithromycin Stopped (11/29/21 1204)   cefTRIAXone (ROCEPHIN)  IV 2 g (11/29/21 2036)   dextrose 5% lactated ringers 10 mL/hr at 11/30/21 1005   lactated ringers     potassium chloride 10 mEq (11/30/21 1004)   vancomycin 1,000 mg (11/30/21 0959)   [START ON 12/01/2021] vancomycin        Patient Profile   25 h/o female w/ h/o tobacco and ETOH abuse (drinks ~4 glasses of wine/day), no prior cardiac history. Admitted w/ sepsis PNA and found to have reduced LV dysfunction on echo. EF 30-35% w/ WMA pattern c/w possible atypical stressed induced CM.   Assessment/Plan   1. Shock  - lactic acid 3.1>>1.7>>2.1  - primarily septic shock in the setting of b/l PNA  - co-ox 68% - c/w abx per PCCM - trend lactic acid for clearance  - given underlying LV dysfunction and persistent septic shock, recommend monitoring co-ox daily to make sure she doesn't tip into cariogenic shock   2. CAP - abx per PCCM - Blood cultures NGTD  - PCT improving, 18>>14   3.  Systolic Heart Failure  - Echo EF 30-35%, RV normal (no prior study for comparison)  - Suspect likely new given WMA pattern c/w possible stress induced CM in the setting of acute sepsis PNA but cannot r/o possibility of preexisting CM, particularly ETOH induced CM  - continue tx of sepsis/ PNA - plan gradual GDMT titration as BP permits  - will need repeat echo in several months. If EF not improved, will need additional w/u to r/o other etiologies w/ [facility] +/- cMRI  - follow daily co-ox to assess for inotropic needs.  - volume status ok on exam, CVP 6 but overall net + 7L w/ IVF resuscitation. Will discuss stopping IVFs w/ CCM  - continue to follow CVPs qshift and daily wts - once discharged, ETOH reduction will be imperative   4. ? Sinus Tach Vs Atypical Atrial Flutter  - will review EKGs w/ EP  - supp K and Mg  - avoid ? blocker given risk of CGS   5. Acute Hypoxic Respiratory Failure - on HFNC  - 2/2 PNA. Volume ok, no edema on CXR, CVP 6   - management per PCCM   6. Elevated LFTs - AST 234>>184 - ALT 74>>70  - Suspect 2/2 shock + ETOH related   7. Hypokalemia - K 3.3  - supp ordered - check Mg in AM (2.0 yesterday)   8. Tobacco/ ETOH Abuse - cessation advised    Length of Stay: 1  Brittainy Simmons, PA-C  11/30/2021, 10:21 AM  Advanced Heart Failure Team Pager [phone] (M-F; 7a - 5p)  Please contact [facility] for night-coverage after hours (4p -7a ) and weekends on [email or web address]  Agree with above.   66 y/o woman with ETOH and tobacco abuse. No known h/o heart disease.   Admitted with shock and lactic acidosis in setting or large R-sided PNA   Ech read as EF 30-35% but I feel more like 40-45%.  Now intubated on 90% FiO2. Co-ox 68% on NE.  ECG with sinus tach. No frank ischemic changes   General:  Ill appearing. On vent  HEENT: normal Neck: supple.CVP 7-8 . Carotids 2+ bilat; no bruits. No lymphadenopathy or thryomegaly appreciated. Cor: PMI nondisplaced.  Regular rate & rhythm. No rubs, gallops or murmurs. Lungs: diffuse r-sided crackles  Abdomen: soft, nontender, nondistended. No hepatosplenomegaly. No bruits or masses. Good bowel  sounds. Extremities: no cyanosis, clubbing, rash, edema Neuro: intubated sedated   She has PNA and sepsis. Suspect likely has stress-induced CM but could also have ischemic LV dysfunction. Continue support with NE. Would start ASA.  Check troponin. Follow CVP and co-ox. Will likely need some diuresis as she stabilizes. Will need ischemic eval prior to d/c.   CRITICAL CARE Performed by: Glori Bickers  Total critical care time: 45 minutes  Critical care time was exclusive of separately billable procedures and treating other patients.  Critical care was necessary to treat or prevent imminent or life-threatening deterioration.  Critical care was time spent personally by me (independent of midlevel providers or residents) on the following activities: development of treatment plan with patient and/or surrogate as well as nursing, discussions with consultants, evaluation of patient's response to treatment, examination of patient, obtaining history from patient or surrogate, ordering and performing treatments and interventions, ordering and review of laboratory studies, ordering and review of radiographic studies, pulse oximetry and re-evaluation of patient's condition.  Glori Bickers, MD  9:37 PM

## 2021-11-30 NOTE — Progress Notes (Signed)
eLink Physician-Brief Progress Note Patient Name: Gina Cherry DOB: 1956/07/02 MRN: [id number]   Date of Service  11/30/2021  HPI/Events of Note  Hypotension - BP soft = 125/56 with MAP = 74 on Norepinephrine IV infusion at 37 mcg/min. CVP = 6.  eICU Interventions  Plan: Bolus with 0.9 NaCl 1 liter IV over 1 hour now.      Intervention Category Major Interventions: Hypotension - evaluation and management  [name] 11/30/2021, 10:24 PM

## 2021-11-30 NOTE — Progress Notes (Signed)
NAME:  Gina Cherry, MRN:  [id number], DOB:  Jun 08, 1956, LOS: 1 ADMISSION DATE:  11/28/2021, CONSULTATION DATE:  11/30/21 REFERRING MD:  Lysbeth Galas, CHIEF COMPLAINT:   worsening hypoxia/dyspnea  History of Present Illness:  Gina Cherry is a 66 y.o. F with PMH of tobacco and ETOH use who presented with 5-6 days of fatigue and poor appetite and diarrhea with mild coughing.  She tried to get out of bed and fell, hitting her head which prompted her significant other to call EMS.  She was found to have bilateral pneumonia with WBC 13k and mildly elevated LFT' s along with hypokalemia.  She was admitted to the floor and given 3L IVF, she initially required 4L [place].   Her oxygen requirement gradually increased and she was transitioned to maximal HFNC, therefore PCCM consulted.   Pt states that she feels better tonight, though is having to sit forward to breath comfortably.  She has been smoking 1/2 ppd for 40+ years, no diagnosis of emphysema or COPD and denies shortness of breath on a normal basis. Repeat CXR with mildly progressing bilateral opacities.   Pertinent  Medical History  Tobacco and ETOH use    Significant Hospital Events: Including procedures, antibiotic start and stop dates in addition to other pertinent events   2/13 admitted to teaching service, later required HFNC and ICU trxfr, On Vanc/Ceftriaxone/Azithromycin 2/14 wbc ct up. Vancs stopped as MRSA PCR neg Left Ventricle: There is moderately reduced LV function with regional  wall motion abnormalities, 30-35%. There is hypokinesis of the basal to  mid segments of all walls of the LV. There is apical hyperkinesis. This pattern could represent an atypical  stress induced cardiomyopathy pattern as this spares the apex  (non-coronary distributions). Left ventricular ejection fraction, by  estimation, is 30 to 35%. The left ventricle has moderately decreased function. The left ventricle demonstrates regional  wall motion  abnormalities.  2/15 increased O2 needs. CXR worse. Mottled. Concern for shock. Vanc added back. Right IJ CVL placed. Right radial aline placed. Cards consulted. Intubated. BAL sent   Interim History / Subjective:  Worse  Objective   Blood pressure (Abnormal) 148/76, pulse (Abnormal) 116, temperature 99.4 F (37.4 C), temperature source Axillary, resp. rate 19, height _0  (1.626 m), weight 45.4 kg, SpO2 94 %. CVP:  [2 mmHg-3 mmHg] 2 mmHg  FiO2 (%):  [90 %-100 %] 100 %   Intake/Output Summary (Last 24 hours) at 11/30/2021 1308 Last data filed at 11/30/2021 0800 Gross per 24 hour  Intake 2224.43 ml  Output 400 ml  Net 1824.43 ml   Filed Weights   11/28/21 0847  Weight: 45.4 kg   General 67 year old female. Now agitated and hypoxic HENT NCAT no JVD  Pulm dec bases. Marked accessory use. Sats 80s or 100% NRB and heated high flow  Card tachy rrr Abd soft  Ext mottled and more cool  Neuro confused   Resolved Hospital Problem list     Assessment & Plan:  Principal Problem:   CAP (community acquired pneumonia) Active Problems:   Acute respiratory failure with hypoxia (HCC)   Sepsis ([place])   Weakness   Elevated LFTs   Tobacco abuse   Risk for falls   Protein-calorie malnutrition, severe   Takotsubo cardiomyopathy   Acute Hypoxic Respiratory Failure secondary to bilateral PNA Long term tobacco use -O2 needs worse.  Plan Full vent support BAL VAP bundle  PAD protocol  Abx day 3 rocephin and azith, day 1 vanc  Septic shock c/b new New stress induced CM (EF 30s)  Plan Cont to trend CVP (goal 8-12) Cycle lactic acids Norepi for MAP > 65 F/u cortisol   New acute metabolic acidosis 2/2 sepsis +/- ETOH w/d Plan PAD protocol  RASS goal -2  H/o tobacco abuse Plan Patch   Fluid & Electrolyte imbalance: Hyponatremia, hypokalemia Na improved  Plan Replace and recheck as needed  Elevated LFT's Alcohol Use Acute hepatitis panel neg; slowly improving   Plan Thiamine, folate and MV  Fall risk Plan  PT consult    Best Practice (right click and "Reselect all SmartList Selections" daily)   Diet/type: NPO w/ oral meds DVT prophylaxis: LMWH GI prophylaxis: N/A Lines: Central line Foley:  N/A Code Status:  full code Last date of multidisciplinary goals of care discussion [Discussed intubation with patient and SO, would want full code]  My cct 33 min   Erick Colace ACNP-BC [facility] Pager # [phone] OR # [phone] if no answer

## 2021-12-01 ENCOUNTER — Inpatient Hospital Stay (HOSPITAL_COMMUNITY): Payer: Medicare HMO

## 2021-12-01 DIAGNOSIS — E440 Moderate protein-calorie malnutrition: Secondary | ICD-10-CM | POA: Insufficient documentation

## 2021-12-01 DIAGNOSIS — R7989 Other specified abnormal findings of blood chemistry: Secondary | ICD-10-CM | POA: Diagnosis not present

## 2021-12-01 DIAGNOSIS — J189 Pneumonia, unspecified organism: Secondary | ICD-10-CM | POA: Diagnosis not present

## 2021-12-01 DIAGNOSIS — A419 Sepsis, unspecified organism: Secondary | ICD-10-CM | POA: Diagnosis not present

## 2021-12-01 DIAGNOSIS — I5181 Takotsubo syndrome: Secondary | ICD-10-CM | POA: Diagnosis not present

## 2021-12-01 DIAGNOSIS — Z720 Tobacco use: Secondary | ICD-10-CM | POA: Diagnosis not present

## 2021-12-01 DIAGNOSIS — J9601 Acute respiratory failure with hypoxia: Secondary | ICD-10-CM | POA: Diagnosis not present

## 2021-12-01 LAB — POCT I-STAT 7, (LYTES, BLD GAS, ICA,H+H)
Acid-Base Excess: 0 mmol/L (ref 0.0–2.0)
Acid-Base Excess: 0 mmol/L (ref 0.0–2.0)
Acid-Base Excess: 1 mmol/L (ref 0.0–2.0)
Bicarbonate: 25.6 mmol/L (ref 20.0–28.0)
Bicarbonate: 27 mmol/L (ref 20.0–28.0)
Bicarbonate: 27.4 mmol/L (ref 20.0–28.0)
Calcium, Ion: 1.1 mmol/L — ABNORMAL LOW (ref 1.15–1.40)
Calcium, Ion: 1.12 mmol/L — ABNORMAL LOW (ref 1.15–1.40)
Calcium, Ion: 1.1 mmol/L — ABNORMAL LOW (ref 1.15–1.40)
HCT: 32 % — ABNORMAL LOW (ref 36.0–46.0)
HCT: 29 % — ABNORMAL LOW (ref 36.0–46.0)
HCT: 30 % — ABNORMAL LOW (ref 36.0–46.0)
Hemoglobin: 10.9 g/dL — ABNORMAL LOW (ref 12.0–15.0)
Hemoglobin: 9.9 g/dL — ABNORMAL LOW (ref 12.0–15.0)
Hemoglobin: 10.2 g/dL — ABNORMAL LOW (ref 12.0–15.0)
O2 Saturation: 95 %
O2 Saturation: 94 %
O2 Saturation: 93 %
Patient temperature: 99.9
Patient temperature: 98.4
Patient temperature: 99.9
Potassium: 3.6 mmol/L (ref 3.5–5.1)
Potassium: 4 mmol/L (ref 3.5–5.1)
Potassium: 3.9 mmol/L (ref 3.5–5.1)
Sodium: 136 mmol/L (ref 135–145)
Sodium: 136 mmol/L (ref 135–145)
Sodium: 136 mmol/L (ref 135–145)
TCO2: 27 mmol/L (ref 22–32)
TCO2: 29 mmol/L (ref 22–32)
TCO2: 29 mmol/L (ref 22–32)
pCO2 arterial: 48.1 mmHg — ABNORMAL HIGH (ref 32–48)
pCO2 arterial: 55.2 mmHg — ABNORMAL HIGH (ref 32–48)
pCO2 arterial: 52.6 mmHg — ABNORMAL HIGH (ref 32–48)
pH, Arterial: 7.338 — ABNORMAL LOW (ref 7.35–7.45)
pH, Arterial: 7.297 — ABNORMAL LOW (ref 7.35–7.45)
pH, Arterial: 7.328 — ABNORMAL LOW (ref 7.35–7.45)
pO2, Arterial: 82 mmHg — ABNORMAL LOW (ref 83–108)
pO2, Arterial: 82 mmHg — ABNORMAL LOW (ref 83–108)
pO2, Arterial: 74 mmHg — ABNORMAL LOW (ref 83–108)

## 2021-12-01 LAB — COMPREHENSIVE METABOLIC PANEL
ALT: 62 U/L — ABNORMAL HIGH (ref 0–44)
ALT: 58 U/L — ABNORMAL HIGH (ref 0–44)
AST: 138 U/L — ABNORMAL HIGH (ref 15–41)
AST: 131 U/L — ABNORMAL HIGH (ref 15–41)
Albumin: 1.6 g/dL — ABNORMAL LOW (ref 3.5–5.0)
Albumin: 1.6 g/dL — ABNORMAL LOW (ref 3.5–5.0)
Alkaline Phosphatase: 45 U/L (ref 38–126)
Alkaline Phosphatase: 44 U/L (ref 38–126)
Anion gap: 9 (ref 5–15)
Anion gap: 7 (ref 5–15)
BUN: 11 mg/dL (ref 8–23)
BUN: 10 mg/dL (ref 8–23)
CO2: 23 mmol/L (ref 22–32)
CO2: 26 mmol/L (ref 22–32)
Calcium: 7.1 mg/dL — ABNORMAL LOW (ref 8.9–10.3)
Calcium: 7.1 mg/dL — ABNORMAL LOW (ref 8.9–10.3)
Chloride: 100 mmol/L (ref 98–111)
Chloride: 103 mmol/L (ref 98–111)
Creatinine, Ser: 0.49 mg/dL (ref 0.44–1.00)
Creatinine, Ser: 0.39 mg/dL — ABNORMAL LOW (ref 0.44–1.00)
GFR, Estimated: >60 mL/min (ref >60)
GFR, Estimated: >60 mL/min (ref >60)
Glucose, Bld: 156 mg/dL — ABNORMAL HIGH (ref 70–99)
Glucose, Bld: 143 mg/dL — ABNORMAL HIGH (ref 70–99)
Potassium: 3.7 mmol/L (ref 3.5–5.1)
Potassium: 3.8 mmol/L (ref 3.5–5.1)
Sodium: 132 mmol/L — ABNORMAL LOW (ref 135–145)
Sodium: 136 mmol/L (ref 135–145)
Total Bilirubin: 0.2 mg/dL — ABNORMAL LOW (ref 0.3–1.2)
Total Bilirubin: 0.5 mg/dL (ref 0.3–1.2)
Total Protein: 4.5 g/dL — ABNORMAL LOW (ref 6.5–8.1)
Total Protein: 4.5 g/dL — ABNORMAL LOW (ref 6.5–8.1)

## 2021-12-01 LAB — GLUCOSE, CAPILLARY
Glucose-Capillary: 139 mg/dL — ABNORMAL HIGH (ref 70–99)
Glucose-Capillary: 135 mg/dL — ABNORMAL HIGH (ref 70–99)
Glucose-Capillary: 107 mg/dL — ABNORMAL HIGH (ref 70–99)
Glucose-Capillary: 145 mg/dL — ABNORMAL HIGH (ref 70–99)
Glucose-Capillary: 134 mg/dL — ABNORMAL HIGH (ref 70–99)
Glucose-Capillary: 175 mg/dL — ABNORMAL HIGH (ref 70–99)

## 2021-12-01 LAB — CBC
HCT: 32.6 % — ABNORMAL LOW (ref 36.0–46.0)
Hemoglobin: 12 g/dL (ref 12.0–15.0)
MCH: 36.8 pg — ABNORMAL HIGH (ref 26.0–34.0)
MCHC: 36.8 g/dL — ABNORMAL HIGH (ref 30.0–36.0)
MCV: 100 fL (ref 80.0–100.0)
Platelets: 266 10*3/uL (ref 150–400)
RBC: 3.26 MIL/uL — ABNORMAL LOW (ref 3.87–5.11)
RDW: 13.8 % (ref 11.5–15.5)
WBC: 17.1 10*3/uL — ABNORMAL HIGH (ref 4.0–10.5)
nRBC: 0 % (ref 0.0–0.2)

## 2021-12-01 LAB — MAGNESIUM
Magnesium: 1.8 mg/dL (ref 1.7–2.4)
Magnesium: 1.8 mg/dL (ref 1.7–2.4)

## 2021-12-01 LAB — COOXEMETRY PANEL
Carboxyhemoglobin: <2.2 % — ABNORMAL HIGH (ref 0.5–1.5)
Carboxyhemoglobin: 1.2 % (ref 0.5–1.5)
Methemoglobin: <0.7 % (ref 0.0–1.5)
Methemoglobin: 1.3 % (ref 0.0–1.5)
O2 Saturation: 90.8 %
O2 Saturation: 88.5 %
Total hemoglobin: 12.8 g/dL (ref 12.0–16.0)
Total hemoglobin: 11.5 g/dL — ABNORMAL LOW (ref 12.0–16.0)

## 2021-12-01 LAB — LACTIC ACID, PLASMA: Lactic Acid, Venous: 1.8 mmol/L (ref 0.5–1.9)

## 2021-12-01 LAB — PHOSPHORUS
Phosphorus: 1.4 mg/dL — ABNORMAL LOW (ref 2.5–4.6)
Phosphorus: 1.5 mg/dL — ABNORMAL LOW (ref 2.5–4.6)

## 2021-12-01 LAB — POCT ACTIVATED CLOTTING TIME: Activated Clotting Time: 0 seconds

## 2021-12-01 LAB — TROPONIN I (HIGH SENSITIVITY): Troponin I (High Sensitivity): 142 ng/L (ref <18)

## 2021-12-01 LAB — PROCALCITONIN: Procalcitonin: 8.31 ng/mL

## 2021-12-01 MED ORDER — PANTOPRAZOLE 2 MG/ML SUSPENSION
40.0000 mg | Freq: Every day | ORAL | Status: DC
Start: 2021-12-01 — End: 2021-12-10
  Administered 2021-12-01 – 2021-12-10 (×10): 40 mg
  Filled 2021-12-01 (×10): qty 20

## 2021-12-01 MED ORDER — PROPOFOL 1000 MG/100ML IV EMUL
25.0000 ug/kg/min | INTRAVENOUS | Status: DC
  Administered 2021-12-01 – 2021-12-02 (×4): 80 ug/kg/min via INTRAVENOUS
  Filled 2021-12-01 (×4): qty 100

## 2021-12-01 MED ORDER — VASOPRESSIN 20 UNITS/100 ML INFUSION FOR SHOCK
0.0000 [IU]/min | INTRAVENOUS | Status: DC
  Administered 2021-12-01: 0.03 [IU]/min via INTRAVENOUS
  Filled 2021-12-01: qty 100

## 2021-12-01 MED ORDER — VITAL HIGH PROTEIN PO LIQD: 1000.0000 mL | ORAL | Status: DC

## 2021-12-01 MED ORDER — FENTANYL CITRATE PF 50 MCG/ML IJ SOSY
25.0000 ug | PREFILLED_SYRINGE | Freq: Once | INTRAMUSCULAR | Status: AC
  Administered 2021-12-01: 25 ug via INTRAVENOUS

## 2021-12-01 MED ORDER — VECURONIUM BROMIDE 10 MG IV SOLR
0.1000 mg/kg | INTRAVENOUS | Status: DC | PRN
  Administered 2021-12-01: 4.5 mg via INTRAVENOUS
  Filled 2021-12-01: qty 10

## 2021-12-01 MED ORDER — PROPOFOL 1000 MG/100ML IV EMUL
0.0000 ug/kg/min | INTRAVENOUS | Status: DC
  Administered 2021-12-01: 5 ug/kg/min via INTRAVENOUS
  Filled 2021-12-01: qty 100

## 2021-12-01 MED ORDER — PROSOURCE TF PO LIQD
45.0000 mL | Freq: Two times a day (BID) | ORAL | Status: DC
  Administered 2021-12-01: 45 mL
  Filled 2021-12-01: qty 45

## 2021-12-01 MED ORDER — POTASSIUM CHLORIDE 20 MEQ PO PACK
40.0000 meq | PACK | Freq: Once | ORAL | Status: AC
  Administered 2021-12-01: 40 meq
  Filled 2021-12-01: qty 2

## 2021-12-01 MED ORDER — MAGNESIUM SULFATE 2 GM/50ML IV SOLN
2.0000 g | Freq: Once | INTRAVENOUS | Status: AC
  Administered 2021-12-01: 2 g via INTRAVENOUS
  Filled 2021-12-01: qty 50

## 2021-12-01 MED ORDER — INSULIN ASPART 100 UNIT/ML IJ SOLN
0.0000 [IU] | INTRAMUSCULAR | Status: DC
  Administered 2021-12-01: 1 [IU] via SUBCUTANEOUS
  Administered 2021-12-01: 2 [IU] via SUBCUTANEOUS
  Administered 2021-12-01 (×2): 1 [IU] via SUBCUTANEOUS
  Administered 2021-12-02: 3 [IU] via SUBCUTANEOUS
  Administered 2021-12-02 (×2): 2 [IU] via SUBCUTANEOUS
  Administered 2021-12-02 (×2): 1 [IU] via SUBCUTANEOUS
  Administered 2021-12-02: 2 [IU] via SUBCUTANEOUS
  Administered 2021-12-03: 1 [IU] via SUBCUTANEOUS
  Administered 2021-12-03 (×3): 2 [IU] via SUBCUTANEOUS
  Administered 2021-12-03 – 2021-12-05 (×9): 1 [IU] via SUBCUTANEOUS
  Administered 2021-12-05: 2 [IU] via SUBCUTANEOUS
  Administered 2021-12-06 – 2021-12-07 (×7): 1 [IU] via SUBCUTANEOUS
  Administered 2021-12-07 (×2): 2 [IU] via SUBCUTANEOUS
  Administered 2021-12-08: 1 [IU] via SUBCUTANEOUS
  Administered 2021-12-08 (×2): 2 [IU] via SUBCUTANEOUS
  Administered 2021-12-08: 1 [IU] via SUBCUTANEOUS
  Administered 2021-12-08 – 2021-12-09 (×2): 2 [IU] via SUBCUTANEOUS
  Administered 2021-12-09: 3 [IU] via SUBCUTANEOUS
  Administered 2021-12-09: 2 [IU] via SUBCUTANEOUS
  Administered 2021-12-09: 1 [IU] via SUBCUTANEOUS
  Administered 2021-12-09 – 2021-12-10 (×3): 2 [IU] via SUBCUTANEOUS
  Administered 2021-12-10 (×2): 1 [IU] via SUBCUTANEOUS

## 2021-12-01 MED ORDER — CHLORHEXIDINE GLUCONATE 0.12 % MT SOLN
OROMUCOSAL | Status: AC
  Filled 2021-12-01: qty 15

## 2021-12-01 MED ORDER — ASPIRIN 81 MG PO CHEW
81.0000 mg | CHEWABLE_TABLET | Freq: Every day | ORAL | Status: DC
  Administered 2021-12-01 – 2021-12-14 (×14): 81 mg
  Filled 2021-12-01 (×14): qty 1

## 2021-12-01 MED ORDER — FENTANYL BOLUS VIA INFUSION
25.0000 ug | INTRAVENOUS | Status: DC | PRN
  Filled 2021-12-01: qty 25

## 2021-12-01 MED ORDER — POTASSIUM CHLORIDE 10 MEQ/50ML IV SOLN: 10.0000 meq | INTRAVENOUS | Status: DC

## 2021-12-01 MED ORDER — DOBUTAMINE IN D5W 4-5 MG/ML-% IV SOLN
2.5000 ug/kg/min | INTRAVENOUS | Status: DC
  Administered 2021-12-01: 2.5 ug/kg/min via INTRAVENOUS
  Filled 2021-12-01: qty 250

## 2021-12-01 MED ORDER — VITAL AF 1.2 CAL PO LIQD
1000.0000 mL | ORAL | Status: AC
Start: 2021-12-01 — End: 2021-12-06
  Administered 2021-12-01 – 2021-12-05 (×4): 1000 mL

## 2021-12-01 MED ORDER — VECURONIUM BROMIDE 10 MG IV SOLR: 0.1000 mg/kg | INTRAVENOUS | Status: DC | PRN

## 2021-12-01 MED ORDER — ARTIFICIAL TEARS OPHTHALMIC OINT
1.0000 "application " | TOPICAL_OINTMENT | Freq: Three times a day (TID) | OPHTHALMIC | Status: DC
  Administered 2021-12-01 – 2021-12-02 (×3): 1 via OPHTHALMIC
  Filled 2021-12-01 (×2): qty 3.5

## 2021-12-01 MED ORDER — FENTANYL 2500MCG IN NS 250ML (10MCG/ML) PREMIX INFUSION
25.0000 ug/h | INTRAVENOUS | Status: DC
  Administered 2021-12-01: 150 ug/h via INTRAVENOUS
  Administered 2021-12-02: 175 ug/h via INTRAVENOUS
  Filled 2021-12-01 (×2): qty 250

## 2021-12-01 MED ORDER — POTASSIUM PHOSPHATES 15 MMOLE/5ML IV SOLN
15.0000 mmol | Freq: Once | INTRAVENOUS | Status: AC
  Administered 2021-12-01: 15 mmol via INTRAVENOUS
  Filled 2021-12-01 (×2): qty 5

## 2021-12-01 NOTE — Progress Notes (Signed)
Patient remains in prone position. Facial edema noted with purplish discoloration on face extending to neck area. Patient placed in reverse trendelenburg position. Head repositioned with RT multiple times since proning. No improvement noted in discoloration. NP Whitney made aware and at bedside to assess. Patient's upper body repositioned as needed to optimize blood return/flow.

## 2021-12-01 NOTE — Progress Notes (Signed)
PCCM progress note  Called to bedside by primary RN stating patient has facial discoloration.  On bedside assessment patient has deep red with blue undertone discoloration from neck up.  Please see image below.    Discoloration possibly secondary to dependent pooling edema versus IJ compression given prone positioning.  Patient was repositioned with bedside RN and attending was notified.    Allice Garro D. Tiburcio Pea, NP-C [facility] Personal contact information can be found on Amion  12/01/2021, 5:29 PM

## 2021-12-01 NOTE — Progress Notes (Signed)
Patient's head and arms repositioned at this time. Head is positioned to the right. No complications noted.

## 2021-12-01 NOTE — Progress Notes (Signed)
PT Cancellation Note  Patient Details Name: Gina Cherry MRN: [id number] DOB: [birth year]   Cancelled Treatment:    Reason Eval/Treat Not Completed: Medical issues which prohibited therapy. Intubated on high vent settings, unstable per RN. PT will follow up at a later time.   Arlyss Gandy 12/01/2021, 10:29 AM

## 2021-12-01 NOTE — Progress Notes (Signed)
Patient's ETT taped at this time due to patient going to be proned.

## 2021-12-01 NOTE — Progress Notes (Signed)
CC Progress Update:  Titrated PEEP up to 14 to help with oxygenation and patient immediately had hypotension.  Decision made to proceed with deep sedation, paralysis and prone positioning. PEEP decreased and dobutamine added for second line agent for mixed cardiogenic/septic shock  Discussed with clinical team and husband updated.   Additional cc time 35 minutes.   Durel Salts, MD Pulmonary and Critical Care Medicine [facility] 12/01/2021 10:29 AM Pager: see AMION  If no response to pager, please call critical care on call (see AMION) until 7pm After 7:00 pm call [facility]

## 2021-12-01 NOTE — Progress Notes (Addendum)
Advanced Heart Failure Rounding Note  PCP-Cardiologist: None   Subjective:    Remains intubated, FiO2 80%. Pressure dropping w/ sedation. On higher NE requirements, currently at 30 mcg.   CXR w/ persistent b/l PNA R>L + small lt pleural effusion, moderate rt.   Remains on ceftriaxone + azithromycin. BCx NGTD. Afebrile overnight. PCT trending down, 18.75>>14.00>>8.31  Lactic acid 3.1>>1.7>>2.1>>2.5  Hs trop 267>>142>>142  CVP 13, co-ox pending.    Objective:   Weight Range: 45.4 kg Body mass index is 17.16 kg/m.   Vital Signs:   Temp:  [98 F (36.7 C)-100.5 F (38.1 C)] 99.9 F (37.7 C) (02/16 0800) Pulse Rate:  [83-142] 108 (02/16 0800) Resp:  [11-35] 20 (02/16 0800) BP: (91-183)/(55-163) 137/69 (02/16 0800) SpO2:  [78 %-99 %] 94 % (02/16 0800) Arterial Line BP: (69-195)/(33-93) 164/57 (02/16 0800) FiO2 (%):  [70 %-100 %] 70 % (02/16 0749) Last BM Date : 11/29/21  Weight change: Filed Weights   11/28/21 0847  Weight: 45.4 kg    Intake/Output:   Intake/Output Summary (Last 24 hours) at 12/01/2021 0950 Last data filed at 12/01/2021 0800 Gross per 24 hour  Intake 4189.3 ml  Output 545 ml  Net 3644.3 ml      Physical Exam    CVP 13 General: critically ill, intubated awake on vent  HEENT: Normal Neck: Supple. JVP 13 cm, Rt IJ CVC, Carotids 2+ bilat; no bruits. No lymphadenopathy or thyromegaly appreciated. Cor: PMI nondisplaced. Regular rhythm tachy rate. No rubs, gallops or murmurs. Lungs: bilateral crackles R>L  Abdomen: Soft, nontender, nondistended. No hepatosplenomegaly. No bruits or masses. Good bowel sounds. Extremities: No cyanosis, clubbing, rash, edema Neuro: intubated, awake on vent    Telemetry   Sinus tach 110s, personally reviewed   EKG    No EKG to review   Labs    CBC Recent Labs    11/30/21 0646 11/30/21 1501 11/30/21 1703 12/01/21 0336  WBC 17.5*  --   --  17.1*  HGB 12.8   < > 10.9* 12.0  HCT 35.1*   < > 32.0*  32.6*  MCV 98.9  --   --  100.0  PLT 251  --   --  266   < > = values in this interval not displayed.   Basic Metabolic Panel Recent Labs    78/65/38 0055 11/29/21 0414 11/30/21 2142 12/01/21 0336 12/01/21 1700  NA 130*   < > 132* 132*  --   K 5.2*   < > 3.9 3.7  --   CL 99   < > 98 100  --   CO2 17*   < > 24 23  --   GLUCOSE 92   < > 153* 156*  --   BUN 11   < > 9 11  --   CREATININE 0.59   < > 0.55 0.49  --   CALCIUM 7.7*   < > 7.3* 7.1*  --   MG 2.0  --   --   --  1.8  PHOS  --   --   --   --  1.5*   < > = values in this interval not displayed.   Liver Function Tests Recent Labs    11/30/21 2142 12/01/21 0336  AST 157* 138*  ALT 66* 62*  ALKPHOS 45 45  BILITOT 0.3 0.2*  PROT 4.7* 4.5*  ALBUMIN 1.8* 1.6*   No results for input(s): LIPASE, AMYLASE in the last 72 hours. Cardiac Enzymes  Recent Labs    11/28/21 1400  CKTOTAL 2,245*    BNP: BNP (last 3 results) Recent Labs    11/30/21 0828  BNP 1,181.2*    ProBNP (last 3 results) No results for input(s): PROBNP in the last 8760 hours.   D-Dimer No results for input(s): DDIMER in the last 72 hours. Hemoglobin A1C Recent Labs    11/28/21 1400  HGBA1C 5.5   Fasting Lipid Panel No results for input(s): CHOL, HDL, LDLCALC, TRIG, CHOLHDL, LDLDIRECT in the last 72 hours. Thyroid Function Tests No results for input(s): TSH, T4TOTAL, T3FREE, THYROIDAB in the last 72 hours.  Invalid input(s): FREET3  Other results:   Imaging    DG Abd 1 View  Result Date: 11/30/2021 CLINICAL DATA:  Nasogastric tube placement. EXAM: ABDOMEN - 1 VIEW COMPARISON:  Chest radiographs same date and 11/28/2021. FINDINGS: 1355 hours. Enteric tube projects below the diaphragm with the tip projecting over the left iliac crest. This atypical course could be due to dilatation of the stomach. The intrathoracic course of the tube appears normal. The bowel gas pattern appears nonobstructive. Right pleural effusion, bibasilar  pulmonary opacities and lumbar spondylosis are noted. IMPRESSION: Enteric tube projects below the diaphragm, although the intra-abdominal portion has an atypical course, extending into the peripheral left mid abdomen. This could relate to a distended stomach. Consider repeat examination after instilling a small amount of enteric contrast through the tube to better assess its position. Electronically Signed   By: Richardean Sale M.D.   On: 11/30/2021 14:13   DG Chest Port 1 View  Result Date: 12/01/2021 CLINICAL DATA:  Pneumonia. EXAM: PORTABLE CHEST 1 VIEW COMPARISON:  11/30/2021. FINDINGS: The heart size and mediastinal contours are stable. Atherosclerotic calcification of the aorta is noted patchy airspace disease is noted in the lungs bilaterally, greater on the right than on the left. There is a small left pleural effusion and moderate right pleural effusion. No pneumothorax. The distal tip of the endotracheal tube terminates 5.1 cm above the carina. An enteric tube courses over the stomach and out of the field of view. A right internal jugular central venous catheter is stable. IMPRESSION: 1. Patchy airspace disease in the lungs bilaterally, greater on the right than on the left, possible edema or infiltrate. 2. Small pleural effusion on the left and moderate pleural effusion on the right. 3. Medical devices as described above. Electronically Signed   By: Brett Fairy M.D.   On: 12/01/2021 04:52   Portable Chest x-ray  Result Date: 11/30/2021 CLINICAL DATA:  Endotracheal tube placement. EXAM: PORTABLE CHEST 1 VIEW COMPARISON:  Radiographs 11/30/2021 and 11/28/2021. FINDINGS: 1349 hours. Interval intubation. Tip of the endotracheal tube overlies the mid trachea. Enteric tube projects below the diaphragm, tip not visualized. Right IJ central venous catheter projects to the level of the mid SVC. Subpulmonic right pleural effusion has enlarged over the last 2 days. There are associated right greater than  left perihilar and lower lobe airspace opacities which are unchanged from earlier today. No evidence of pneumothorax. The heart size and mediastinal contours are stable. IMPRESSION: Satisfactory position of the endotracheal and enteric tubes. No change in right pleural effusion and right greater than left airspace opacities. Electronically Signed   By: Richardean Sale M.D.   On: 11/30/2021 14:09     Medications:     Scheduled Medications:  artificial tears  1 application Both Eyes [id number]   aspirin  81 mg Per Tube Daily   chlorhexidine  15 mL  Mouth Rinse BID   chlorhexidine gluconate (MEDLINE KIT)  15 mL Mouth Rinse BID   Chlorhexidine Gluconate Cloth  6 each Topical Q0600   docusate  100 mg Per Tube BID   enoxaparin (LOVENOX) injection  30 mg Subcutaneous Q24H   feeding supplement (PROSource TF)  45 mL Per Tube BID   feeding supplement (VITAL HIGH PROTEIN)  1,000 mL Per Tube Q24H   fentaNYL (SUBLIMAZE) injection  25 mcg Intravenous Once   folic acid  1 mg Per Tube Daily   insulin aspart  0-9 Units Subcutaneous Q4H   ipratropium-albuterol  3 mL Nebulization TID   mouth rinse  15 mL Mouth Rinse Q2H   multivitamin with minerals  1 tablet Per Tube Daily   nicotine  21 mg Transdermal Daily   pantoprazole sodium  40 mg Per Tube Daily   polyethylene glycol  17 g Per Tube Daily   thiamine  100 mg Per Tube Daily    Infusions:  sodium chloride 10 mL/hr at 12/01/21 0800   azithromycin 500 mg (12/01/21 0934)   cefTRIAXone (ROCEPHIN)  IV Stopped (11/30/21 2052)   fentaNYL infusion INTRAVENOUS     norepinephrine (LEVOPHED) Adult infusion 9 mcg/min (11/30/21 1940)   propofol (DIPRIVAN) infusion     vancomycin 750 mg (12/01/21 0943)    PRN Medications: Place/Maintain arterial line **AND** sodium chloride, acetaminophen, fentaNYL, ipratropium-albuterol, midazolam, midazolam, vecuronium    Patient Profile   66 y/o female w/ h/o tobacco and ETOH abuse (drinks ~4 glasses of wine/day), no prior  cardiac history. Admitted w/ sepsis PNA and found to have reduced LV dysfunction on echo. EF 40-45% w/ WMA pattern c/w possible atypical stressed induced CM.    Assessment/Plan   1. Shock  - lactic acid 3.1>>1.7>>2.1>2.5  - primarily septic shock in the setting of b/l PNA  - may be developing component of cardiogenic shock, co-ox pending  - on high dose NE at 30 mcg. MAPs currently better off sedation. - Addition of VP if needed to keep MAP >65 - Addition of DBA if co-ox results low  - c/w abx per PCCM - trend lactic acid for clearance    2. CAP - abx per PCCM - Blood cultures NGTD  - PCT improving, 18>>14>>8.31   3. Acute Systolic Heart Failure  - Echo EF read 30-35% (feel more like 40-45%), RV normal (no prior study for comparison)  - Suspect likely new given WMA pattern c/w possible stress induced CM in the setting of acute sepsis PNA but cannot r/o possibility of preexisting CM, particularly ETOH induced CM vs Ischemic CM  - continue tx of sepsis/ PNA - no GDMT currently w/ hypotension and high NE requirements - plan gradual GDMT titration once stable from infectious standpoint and as BP permits  - will need repeat echo after recovery. If EF not improved, will need additional w/u to r/o other etiologies w/ [facility] +/- cMRI  - follow daily co-ox to assess for inotropic needs (pending)   - volume status ok. CVP 13. Allow higher CVP w/ sepsis  - once discharged, ETOH reduction will be imperative    5. Acute Hypoxic Respiratory Failure - b/l PNA  - intubated  - management per PCCM    6. Elevated LFTs - Suspect 2/2 shock + ETOH related  - LFTs trending down    7. Hypokalemia/ Hypomagnesemia  - K 3.7 , Mg 1.8  - supp ordered  8. Tobacco/ ETOH Abuse - cessation advised   7. Elevated HS  trop  - Hs trop 267>>142>>142 - level not checked on admission, unclear if peak was higher  - cannot r/o out the possibility of out of hospital MI prior to arrival  - will need LHC to r/o  ischemic heart disease once improved from septic standpoint      Length of Stay: 2  Lyda Jester, PA-C  12/01/2021, 9:50 AM  Advanced Heart Failure Team Pager [phone] (M-F; 7a - 5p)  Please contact [facility] for night-coverage after hours (5p -7a ) and weekends on [email or web address]  Agree with above.   Patient with worsening respiratory distress and pressor requirement this am. Now proned. Pressor requirement improving.   Co-ox 88% CVP 12 PCT coming down   General:  Intubated proned ill-appearing HEENT:  + ETT Neck: supple. RIJ TLC Cor: Not well herd in prone position Lungs: Diffuse crackles and rhonchi on R Abdomen: proned  Extremities: no cyanosis, clubbing, rash, edema warm Neuro: intubated/sedated  She remains critically ill with worsening sepsis and respiratory failure. Now proned. Remains on NE, Co-ox ok suggesting adequate perfusion. CVP ok. Continue supportive care. Will need repeat echo and ischemic eval, if /when she recovers.   CRITICAL CARE Performed by: Glori Bickers  Total critical care time: 35 minutes  Critical care time was exclusive of separately billable procedures and treating other patients.  Critical care was necessary to treat or prevent imminent or life-threatening deterioration.  Critical care was time spent personally by me (independent of midlevel providers or residents) on the following activities: development of treatment plan with patient and/or surrogate as well as nursing, discussions with consultants, evaluation of patient's response to treatment, examination of patient, obtaining history from patient or surrogate, ordering and performing treatments and interventions, ordering and review of laboratory studies, ordering and review of radiographic studies, pulse oximetry and re-evaluation of patient's condition.  Glori Bickers, MD  10:43 PM

## 2021-12-01 NOTE — Progress Notes (Signed)
Head and arms repositioned at this time by RT and two RNs. Head is positioned to left. No complications noted.

## 2021-12-01 NOTE — Progress Notes (Signed)
NAME:  Gina Cherry, MRN:  [id number], DOB:  1956-02-17, LOS: 2 ADMISSION DATE:  11/28/2021, CONSULTATION DATE:  12/01/21 REFERRING MD:  Lysbeth Galas, CHIEF COMPLAINT:   worsening hypoxia/dyspnea  History of Present Illness:  Gina Cherry is a 66 y.o. F with PMH of tobacco and ETOH use who presented with 5-6 days of fatigue and poor appetite and diarrhea with mild coughing.  She tried to get out of bed and fell, hitting her head which prompted her significant other to call EMS.  She was found to have bilateral pneumonia with WBC 13k and mildly elevated LFT' s along with hypokalemia.  She was admitted to the floor and given 3L IVF, she initially required 4L [place].   Her oxygen requirement gradually increased and she was transitioned to maximal HFNC, therefore PCCM consulted.   Pt states that she feels better tonight, though is having to sit forward to breath comfortably.  She has been smoking 1/2 ppd for 40+ years, no diagnosis of emphysema or COPD and denies shortness of breath on a normal basis. Repeat CXR with mildly progressing bilateral opacities.  Pertinent  Medical History  Tobacco and ETOH use   Significant Hospital Events: Including procedures, antibiotic start and stop dates in addition to other pertinent events   2/13 admitted to teaching service, later required HFNC and ICU trxfr, On Vanc/Ceftriaxone/Azithromycin 2/14 wbc ct up. Vancs stopped as MRSA PCR neg  ECHO Mderately reduced LV function with regional  wall motion abnormalities, 30-35%. There is hypokinesis of the basal to  mid segments of all walls of the LV. There is apical hyperkinesis. This pattern could represent an atypical  stress induced cardiomyopathy pattern as this spares the apex  (non-coronary distributions).  2/15 increased O2 needs. CXR worse. Mottled. Concern for shock. Vanc added back. Right IJ CVL placed. Right radial aline placed. Cards consulted. Intubated. BAL sent  2/16 remains intubated with high vent  settings including FiO2 70% PEEP 10.  Lower extremities no longer mottled  Interim History / Subjective:  Sedated on ventilator  Objective   Blood pressure 98/64, pulse 97, temperature 98 F (36.7 C), temperature source Axillary, resp. rate 17, height _0  (1.626 m), weight 45.4 kg, SpO2 94 %. CVP:  [2 mmHg-14 mmHg] 13 mmHg  Vent Mode: PRVC FiO2 (%):  [80 %-100 %] 80 % Set Rate:  [18 bmp] 18 bmp Vt Set:  [430 mL] 430 mL PEEP:  [8 cmH20-10 cmH20] 10 cmH20 Plateau Pressure:  [16 cmH20-20 cmH20] 16 cmH20   Intake/Output Summary (Last 24 hours) at 12/01/2021 4196 Last data filed at 12/01/2021 0600 Gross per 24 hour  Intake 4206.17 ml  Output 545 ml  Net 3661.17 ml    Filed Weights   11/28/21 0847  Weight: 45.4 kg   Physical Exam General: Acute on chronically ill appearing elderly female lying in bed on mechanical ventilation in no acute distress HEENT: ETT, MM pink/moist, PERRL,  Neuro: Seen spontaneously moving and opening eyes, sedated on fentanyl and Precedex CV: s1s2 regular rate and rhythm, no murmur, rubs, or gallops,  PULM: Clear to auscultation bilaterally, no increased work of breathing, no added breath sounds GI: soft, bowel sounds active in all 4 quadrants, non-tender, non-distended, tolerating TF] Extremities: warm/dry, no edema  Skin: no rashes or lesions  Resolved Hospital Problem list   New acute metabolic acidosis 2/2 sepsis +/- ETOH w/d  Assessment & Plan:  Principal Problem:   CAP (community acquired pneumonia) Active Problems:   Septic shock ([place])  Weakness   Acute respiratory failure with hypoxia (HCC)   Elevated LFTs   Tobacco abuse   Risk for falls   Protein-calorie malnutrition, severe   Takotsubo cardiomyopathy   Acute Hypoxic Respiratory Failure secondary to bilateral PNA Long term tobacco use -O2 needs worse.  P: Continue ventilator support with lung protective strategies  Wean PEEP and FiO2 for sats greater than 90%. Head of bed  elevated 30 degrees. Plateau pressures less than 30 cm H20.  Follow intermittent chest x-ray and ABG.   SAT/SBT as tolerated, mentation preclude extubation  Ensure adequate pulmonary hygiene  Follow cultures/BAl results  VAP bundle in place  PAD protocol Continue ceftriaxone and vancomycin  Septic shock secondary to PNA c/b new New stress induced CM (EF 30s)  P: Vent support as above Follow culture data IV antibiotics as above Continue pressors for map goal greater than 65  New systolic congestive heart failure -Echo with EF of 30 to 35% with apical hypokinesis, pattern could represent atypical stress-induced cardiomyopathy -Suspect new decreased EF secondary to critical illness Sinus tachycardia versus atypical atrial flutter P: Heart failure consulted, appreciate assistance Continue treatment for sepsis as above Will need ischemic evaluation prior to discharge GDMT as able Continue pressors currently Trend CVP When able diurese as patient is currently 10 L positive Strict intake and output Daily weight Optimize electrolytes  Fluid & Electrolyte imbalance: Hyponatremia, hypokalemia -Na improved  P: Trend Bmet  Supplement as needed   Elevated LFT's Alcohol Use -Acute hepatitis panel neg; slowly improving  P: Avoid hepatotoxins Trend LFTs Supplement thiamine, folate, and multivitamin Cessation education when appropriate  Fall risk P: When appropriate PT/OT   Best Practice (right click and "Reselect all SmartList Selections" daily)   Diet/type: NPO w/ oral meds DVT prophylaxis: LMWH GI prophylaxis: N/A Lines: Central line Foley:  N/A Code Status:  full code Last date of multidisciplinary goals of care discussion [Discussed intubation with patient and SO, would want full code]  CRITICAL CARE Performed by: Yoshiye Kraft D. Harris  Total critical care time: 40 minutes  Critical care time was exclusive of separately billable procedures and treating other  patients.  Critical care was necessary to treat or prevent imminent or life-threatening deterioration.  Critical care was time spent personally by me on the following activities: development of treatment plan with patient and/or surrogate as well as nursing, discussions with consultants, evaluation of patient's response to treatment, examination of patient, obtaining history from patient or surrogate, ordering and performing treatments and interventions, ordering and review of laboratory studies, ordering and review of radiographic studies, pulse oximetry and re-evaluation of patient's condition.  Hiro Vipond D. Kenton Kingfisher, NP-C [facility] Personal contact information can be found on Amion  12/01/2021, 7:50 AM

## 2021-12-01 NOTE — Progress Notes (Signed)
Nutrition Follow-up  DOCUMENTATION CODES:   Underweight, Severe malnutrition in context of social or environmental circumstances  INTERVENTION:   Initiate tube feeding via OG tube: Vital AF 1.2 at 20 ml/h and increase by 10 ml every 8 hours to goal rate of 50 ml/hr (1200 ml per day)  Provides 1440 kcal, 90 gm protein, 973 ml free water daily  Continue MVI with minerals   Monitor magnesium and phosphorus every 12 hours x 4 occurrences, MD to replete as needed, as pt is at risk for refeeding syndrome given malnutrition.  Phos 1.5 prior to TF being started, spoke with NP who is adding supplementation.   Propofol providing additional kcal from lipid  NUTRITION DIAGNOSIS:   Moderate Malnutrition (in the context of social/environmental circumstances) related to  (prolonged inadequate energy intake) as evidenced by mild fat depletion, moderate fat depletion, severe muscle depletion. Ongoing.   GOAL:   Patient will meet greater than or equal to 90% of their needs Progressing   MONITOR:   PO intake, Supplement acceptance, Weight trends  REASON FOR ASSESSMENT:   Consult Enteral/tube feeding initiation and management  ASSESSMENT:   Pt with no known PMH except long history of tobacco use, currently smoking 1/2 pack per day, ETOH use (4 glasses of wine/day) admitted after fall at home with sepsis secondary to RLL PNA.   Pt discussed during ICU rounds and with RN. Pt being paralyzed and proned due to cardiogenic/septic shock CXR with persistent bilateral PNA R>L, small L pleural effusion, moderate R pleural effusion  On pressors, lactic acid 2.5, CVP 13 Per RN pt did not tolerate precedex and is now on propofol  Cardiology following for new systolic CHF, EF 37-62%  2/15 intubated 2/16 prone; starting TF   Patient is currently intubated on ventilator support MV: 7.7 L/min Temp (24hrs), Avg:99.4 F (37.4 C), Min:98 F (36.7 C), Max:100.5 F (38.1 C)  Propofol: 16 ml/hr  provides: 422 kcal   Medications reviewed and include: colace, folic acid, SSI, MVI with minerals, protonix, miralax, thiamine  Fentanyl  Levophed @ 18 mcg MAP: >70 Mag sulfate x 1  KCl   Labs reviewed: PO4: 1.4  -> 1.5, BNP: 1181, troponin: 142 CBG's: 107-139  16 F OG tube: per xray tip below diaphragm    Diet Order:   Diet Order             Diet NPO time specified  Diet effective now                   EDUCATION NEEDS:   Education needs have been addressed  Skin:  Skin Assessment: Reviewed RN Assessment (Ecchymosis to the bilateral legs, laceration to the head)  Last BM:  2/14 - type 7  Height:   Ht Readings from Last 1 Encounters:  11/30/21 5\' 4"  (1.626 m)    Weight:   Wt Readings from Last 1 Encounters:  11/28/21 45.4 kg    Ideal Body Weight:  54.5 kg  BMI:  Body mass index is 17.16 kg/m.  Estimated Nutritional Needs:   Kcal:  1500-1700 kcal/d  Protein:  75-85 g/d  Fluid:  1.5-1.7 L/d  11/30/21., RD, LDN, CNSC See AMiON for contact information

## 2021-12-01 NOTE — Progress Notes (Signed)
eLink Physician-Brief Progress Note Patient Name: Gina Cherry DOB: 08-25-1956 MRN: [id number]   Date of Service  12/01/2021  HPI/Events of Note  Called d/t elevated Troponin = 267 --> 142. Troponin ordered by Cardiology. Demand ischemia? Troponin now trending down.  eICU Interventions  Defer management of elevated troponin to Cardiology.      Intervention Category Major Interventions: Other:  Lenell Antu 12/01/2021, 5:35 AM

## 2021-12-01 NOTE — Progress Notes (Signed)
OT Cancellation Note  Patient Details Name: DAYZEE TROWER MRN: [id number] DOB: December 14, 1955   Cancelled Treatment:    Reason Eval/Treat Not Completed: Medical issues which prohibited therapy. Pt intubated and proned. Will sign off at this time.Please reorder when appropriate.  Thornell Mule, OT/L   Acute OT Clinical Specialist Acute Rehabilitation Services Pager [phone] Office [phone]  12/01/2021, 1:10 PM

## 2021-12-01 NOTE — Progress Notes (Signed)
All jewelry removed and gave to husband. 5 rings, 2 bracelets.

## 2021-12-01 NOTE — Progress Notes (Signed)
Received Critical result from lab.  Troponin 267  Notified Elink of result; Also paged Cardiology on Call. Have yet to receive call back from Card, will page again shortly.   No new orders; Repeat draw of troponin in AM.   Jannifer Hick, RN

## 2021-12-02 DIAGNOSIS — A419 Sepsis, unspecified organism: Secondary | ICD-10-CM | POA: Diagnosis not present

## 2021-12-02 DIAGNOSIS — I5181 Takotsubo syndrome: Secondary | ICD-10-CM | POA: Diagnosis not present

## 2021-12-02 DIAGNOSIS — R6521 Severe sepsis with septic shock: Secondary | ICD-10-CM | POA: Diagnosis not present

## 2021-12-02 DIAGNOSIS — J80 Acute respiratory distress syndrome: Secondary | ICD-10-CM | POA: Diagnosis not present

## 2021-12-02 DIAGNOSIS — A481 Legionnaires' disease: Secondary | ICD-10-CM | POA: Diagnosis not present

## 2021-12-02 DIAGNOSIS — J9601 Acute respiratory failure with hypoxia: Secondary | ICD-10-CM | POA: Diagnosis not present

## 2021-12-02 LAB — COOXEMETRY PANEL
Carboxyhemoglobin: 1.2 % (ref 0.5–1.5)
Carboxyhemoglobin: 1.5 % (ref 0.5–1.5)
Carboxyhemoglobin: 1 % (ref 0.5–1.5)
Methemoglobin: 1.4 % (ref 0.0–1.5)
Methemoglobin: 1.1 % (ref 0.0–1.5)
Methemoglobin: 1 % (ref 0.0–1.5)
O2 Saturation: 97.2 %
O2 Saturation: 95.9 %
O2 Saturation: 83.5 %
Total hemoglobin: 12 g/dL (ref 12.0–16.0)
Total hemoglobin: 11.5 g/dL — ABNORMAL LOW (ref 12.0–16.0)
Total hemoglobin: 10.9 g/dL — ABNORMAL LOW (ref 12.0–16.0)

## 2021-12-02 LAB — CBC
HCT: 32.5 % — ABNORMAL LOW (ref 36.0–46.0)
Hemoglobin: 11.8 g/dL — ABNORMAL LOW (ref 12.0–15.0)
MCH: 37.3 pg — ABNORMAL HIGH (ref 26.0–34.0)
MCHC: 36.3 g/dL — ABNORMAL HIGH (ref 30.0–36.0)
MCV: 102.8 fL — ABNORMAL HIGH (ref 80.0–100.0)
Platelets: 299 10*3/uL (ref 150–400)
RBC: 3.16 MIL/uL — ABNORMAL LOW (ref 3.87–5.11)
RDW: 14.7 % (ref 11.5–15.5)
WBC: 17.2 10*3/uL — ABNORMAL HIGH (ref 4.0–10.5)
nRBC: 0 % (ref 0.0–0.2)

## 2021-12-02 LAB — POCT I-STAT 7, (LYTES, BLD GAS, ICA,H+H)
Acid-Base Excess: 1 mmol/L (ref 0.0–2.0)
Bicarbonate: 27.7 mmol/L (ref 20.0–28.0)
Calcium, Ion: 1.1 mmol/L — ABNORMAL LOW (ref 1.15–1.40)
HCT: 30 % — ABNORMAL LOW (ref 36.0–46.0)
Hemoglobin: 10.2 g/dL — ABNORMAL LOW (ref 12.0–15.0)
O2 Saturation: 92 %
Patient temperature: 98
Potassium: 3.7 mmol/L (ref 3.5–5.1)
Sodium: 135 mmol/L (ref 135–145)
TCO2: 29 mmol/L (ref 22–32)
pCO2 arterial: 52.7 mmHg — ABNORMAL HIGH (ref 32–48)
pH, Arterial: 7.327 — ABNORMAL LOW (ref 7.35–7.45)
pO2, Arterial: 69 mmHg — ABNORMAL LOW (ref 83–108)

## 2021-12-02 LAB — MAGNESIUM: Magnesium: 2.5 mg/dL — ABNORMAL HIGH (ref 1.7–2.4)

## 2021-12-02 LAB — COMPREHENSIVE METABOLIC PANEL
ALT: 55 U/L — ABNORMAL HIGH (ref 0–44)
AST: 114 U/L — ABNORMAL HIGH (ref 15–41)
Albumin: 1.6 g/dL — ABNORMAL LOW (ref 3.5–5.0)
Alkaline Phosphatase: 58 U/L (ref 38–126)
Anion gap: 6 (ref 5–15)
BUN: 6 mg/dL — ABNORMAL LOW (ref 8–23)
CO2: 26 mmol/L (ref 22–32)
Calcium: 7 mg/dL — ABNORMAL LOW (ref 8.9–10.3)
Chloride: 102 mmol/L (ref 98–111)
Creatinine, Ser: 0.31 mg/dL — ABNORMAL LOW (ref 0.44–1.00)
GFR, Estimated: >60 mL/min (ref >60)
Glucose, Bld: 156 mg/dL — ABNORMAL HIGH (ref 70–99)
Potassium: 3.8 mmol/L (ref 3.5–5.1)
Sodium: 134 mmol/L — ABNORMAL LOW (ref 135–145)
Total Bilirubin: 0.3 mg/dL (ref 0.3–1.2)
Total Protein: 4.6 g/dL — ABNORMAL LOW (ref 6.5–8.1)

## 2021-12-02 LAB — GLUCOSE, CAPILLARY
Glucose-Capillary: 128 mg/dL — ABNORMAL HIGH (ref 70–99)
Glucose-Capillary: 149 mg/dL — ABNORMAL HIGH (ref 70–99)
Glucose-Capillary: 194 mg/dL — ABNORMAL HIGH (ref 70–99)
Glucose-Capillary: 194 mg/dL — ABNORMAL HIGH (ref 70–99)
Glucose-Capillary: 197 mg/dL — ABNORMAL HIGH (ref 70–99)
Glucose-Capillary: 206 mg/dL — ABNORMAL HIGH (ref 70–99)

## 2021-12-02 LAB — TRIGLYCERIDES: Triglycerides: 255 mg/dL — ABNORMAL HIGH (ref <150)

## 2021-12-02 LAB — PHOSPHORUS: Phosphorus: 1.5 mg/dL — ABNORMAL LOW (ref 2.5–4.6)

## 2021-12-02 LAB — CYTOLOGY - NON PAP

## 2021-12-02 MED ORDER — SODIUM CHLORIDE 0.9 % IV SOLN
500.0000 mg | INTRAVENOUS | Status: DC
  Filled 2021-12-02: qty 5

## 2021-12-02 MED ORDER — CALCIUM GLUCONATE-NACL 1-0.675 GM/50ML-% IV SOLN
1.0000 g | Freq: Once | INTRAVENOUS | Status: AC
  Administered 2021-12-02: 1000 mg via INTRAVENOUS
  Filled 2021-12-02: qty 50

## 2021-12-02 MED ORDER — SODIUM PHOSPHATES 45 MMOLE/15ML IV SOLN
15.0000 mmol | Freq: Once | INTRAVENOUS | Status: AC
  Administered 2021-12-02: 15 mmol via INTRAVENOUS
  Filled 2021-12-02: qty 5

## 2021-12-02 MED ORDER — LEVOFLOXACIN IN D5W 750 MG/150ML IV SOLN
750.0000 mg | INTRAVENOUS | Status: AC
  Administered 2021-12-02 – 2021-12-11 (×10): 750 mg via INTRAVENOUS
  Filled 2021-12-02 (×10): qty 150

## 2021-12-02 MED ORDER — FENTANYL BOLUS VIA INFUSION
25.0000 ug | INTRAVENOUS | Status: DC | PRN
  Administered 2021-12-07 (×2): 25 ug via INTRAVENOUS
  Filled 2021-12-02: qty 100

## 2021-12-02 MED ORDER — PROPOFOL 1000 MG/100ML IV EMUL
0.0000 ug/kg/min | INTRAVENOUS | Status: DC
  Administered 2021-12-02: 16:00:00 65 ug/kg/min via INTRAVENOUS
  Administered 2021-12-02: 59 ug/kg/min via INTRAVENOUS
  Administered 2021-12-02: 70 ug/kg/min via INTRAVENOUS
  Administered 2021-12-03: 16:00:00 20 ug/kg/min via INTRAVENOUS
  Administered 2021-12-03: 21:00:00 60 ug/kg/min via INTRAVENOUS
  Administered 2021-12-03 (×2): 59 ug/kg/min via INTRAVENOUS
  Administered 2021-12-04 (×2): 60 ug/kg/min via INTRAVENOUS
  Administered 2021-12-04: 10 ug/kg/min via INTRAVENOUS
  Administered 2021-12-05: 5 ug/kg/min via INTRAVENOUS
  Filled 2021-12-02 (×12): qty 100

## 2021-12-02 MED ORDER — POTASSIUM PHOSPHATES 15 MMOLE/5ML IV SOLN
30.0000 mmol | Freq: Once | INTRAVENOUS | Status: AC
  Administered 2021-12-02: 30 mmol via INTRAVENOUS
  Filled 2021-12-02: qty 10

## 2021-12-02 MED ORDER — FENTANYL 2500MCG IN NS 250ML (10MCG/ML) PREMIX INFUSION
25.0000 ug/h | INTRAVENOUS | Status: DC
  Administered 2021-12-02 – 2021-12-03 (×2): 175 ug/h via INTRAVENOUS
  Administered 2021-12-03 – 2021-12-04 (×2): 200 ug/h via INTRAVENOUS
  Administered 2021-12-04: 175 ug/h via INTRAVENOUS
  Administered 2021-12-06 – 2021-12-09 (×5): 200 ug/h via INTRAVENOUS
  Filled 2021-12-02 (×11): qty 250

## 2021-12-02 MED ORDER — POTASSIUM CHLORIDE CRYS ER 20 MEQ PO TBCR: 40.0000 meq | EXTENDED_RELEASE_TABLET | Freq: Once | ORAL | Status: DC

## 2021-12-02 MED ORDER — POTASSIUM CHLORIDE 20 MEQ PO PACK
40.0000 meq | PACK | Freq: Once | ORAL | Status: AC
  Administered 2021-12-02: 40 meq
  Filled 2021-12-02: qty 2

## 2021-12-02 MED ORDER — SODIUM CHLORIDE 0.9 % IV SOLN: 2.0000 g | INTRAVENOUS | Status: DC

## 2021-12-02 NOTE — Progress Notes (Signed)
NAME:  Gina Cherry, MRN:  [id number], DOB:  May 30, 1956, LOS: 3 ADMISSION DATE:  11/28/2021, CONSULTATION DATE:  12/02/21 REFERRING MD:  Lysbeth Galas, CHIEF COMPLAINT:   worsening hypoxia/dyspnea  History of Present Illness:  Gina Cherry is a 66 y.o. F with PMH of tobacco and ETOH use who presented with CAP ,bilateral pneumonia with WBC 13k and mildly elevated LFT' s along with hypokalemia.  She was admitted to the floor, intubated 2/15 for ARDS Course complicated by shock, stress cardiomyopathy  She smoking 1/2 ppd for 40+ years  Pertinent  Medical History  Tobacco and ETOH use   Significant Hospital Events: Including procedures, antibiotic start and stop dates in addition to other pertinent events   2/13 admitted to teaching service, later required HFNC and ICU trxfr, On Vanc/Ceftriaxone/Azithromycin 2/14 wbc ct up. Vancs stopped as MRSA PCR neg  ECHO Mderately reduced LV function with regional  wall motion abnormalities, 30-35%. There is hypokinesis of the basal to  mid segments of all walls of the LV. There is apical hyperkinesis. This pattern could represent an atypical  stress induced cardiomyopathy pattern as this spares the apex  (non-coronary distributions).  2/15 increased O2 needs. CXR worse. Mottled. Concern for shock. Vanc added back. Right IJ CVL placed. Right radial aline placed. Cards consulted. Intubated. BAL sent  2/16 remains intubated with high vent settings including FiO2 70% PEEP 10.  Lower extremities no longer mottled 2/17 prone position for worsening hypoxia  Interim History / Subjective:   Critically ill, intubated Supinated around 3 AM Now on 60%/PEEP of 10 Remains on 19 mics of Levophed. Sedated on propofol and fentanyl  Objective   Blood pressure (!) 101/55, pulse 87, temperature 98 F (36.7 C), temperature source Oral, resp. rate (!) 22, height _0  (1.626 m), weight 53.5 kg, SpO2 97 %. CVP:  [5 mmHg-21 mmHg] 13 mmHg  Vent Mode: PRVC FiO2  (%):  [60 %-80 %] 60 % Set Rate:  [18 bmp-22 bmp] 22 bmp Vt Set:  [430 mL] 430 mL PEEP:  [10 cmH20] 10 cmH20 Plateau Pressure:  [19 cmH20-21 cmH20] 20 cmH20   Intake/Output Summary (Last 24 hours) at 12/02/2021 3557 Last data filed at 12/02/2021 0700 Gross per 24 hour  Intake 2707.12 ml  Output 870 ml  Net 1837.12 ml    Filed Weights   11/28/21 0847 12/02/21 0400  Weight: 45.4 kg 53.5 kg   Physical Exam General: Acute on chronically ill appearing elderly female lying in bed on mechanical ventilation in no acute distress HEENT: ETT, MM pink/moist, PERRL,  Neuro: Sedated, paralyzed, RASS -4, pupils pinpoint CV: s1s2 regular rate and rhythm, no murmur, rubs, or gallops,  PULM: Bilateral ventilated breath sounds, no accessory muscle use GI: soft, bowel sounds active in all 4 quadrants, non-tender, non-distended, tolerating TF Extremities: warm/dry, no edema  Skin: no rashes , ecchymosis over left leg  Chest x-ray 2/16 independently reviewed, bibasilar pneumonia?  Right effusion  ABG shows mild respiratory acidosis, PO2 69 Labs show hypophosphatemia, mildly elevated LFTs, persistent leukocytosis  Resolved Hospital Problem list   New acute metabolic acidosis 2/2 sepsis +/- ETOH w/d  Assessment & Plan:  Principal Problem:   CAP (community acquired pneumonia) Active Problems:   Septic shock ([place])   Weakness   Acute respiratory failure with hypoxia (HCC)   Elevated LFTs   Tobacco abuse   Risk for falls   Protein-calorie malnutrition, severe   Takotsubo cardiomyopathy   Malnutrition of moderate degree   ARDS secondary  to bilateral PNA Long term tobacco use -2/17 O2 needs improving P: Continue ventilator support with lung protective strategies  Wean PEEP and FiO2 for sats greater than 90%. Head of bed elevated 30 degrees. Plateau pressure is 20, driving pressure 10, acceptable Does not need prone positioning today Follow intermittent chest x-ray and ABG.   SAT/SBT on  hold Ensure adequate pulmonary hygiene  Follow cultures/BAl results  VAP bundle in place  PAD protocol -propofol and fentanyl maintaining deep sedation for vent , synchrony, goal RASS -4 to -5, try to avoid paralytic  -triglycerides rising, so minimize propofol   Septic shock secondary to PNA c/b new New stress induced CM (EF 30s)  Legionella pneumonia P: Switch antibiotics to Levaquin dc ceftriaxone, dc  vancomycin Continue levophed for map goal greater than 65  Likely right pleural effusion, pneumonic -We will assess with ultrasound for thoracenteses once PEEP requirements decrease -Chest x-ray in a.m.  Likely COPD underlying -DuoNebs 3 times daily and as needed  New systolic congestive heart failure -Echo with EF of 30 to 35% with apical hypokinesis, pattern could represent atypical stress-induced cardiomyopathy vs pre-existing -Suspect new decreased EF secondary to critical illness Sinus tachycardia versus atypical atrial flutter P: Heart failure consulted, appreciate assistance Did not tolerate dobutamine Will need ischemic evaluation prior to discharge GDMT when able Trend CVP, will need diuresis once pressor requirements decrease, 2 L positive Strict intake and output Daily weight   Fluid & Electrolyte imbalance: Hyponatremia, hypokalemia -Hypophosphatemia P: Trend Bmet  Supplement K-Phos  Elevated LFT's Alcohol Use -Acute hepatitis panel neg; slowly improving  P: Avoid hepatotoxins Trend LFTs Supplement thiamine, folate, and multivitamin Cessation education when appropriate  Fall risk P: When appropriate PT/OT    Boyfriend Ronalee Belts updated at bedside Best Practice (right click and "Reselect all SmartList Selections" daily)   Diet/type: tubefeeds DVT prophylaxis: LMWH GI prophylaxis: N/A Lines: Central line Foley:  N/A Code Status:  full code Last date of multidisciplinary goals of care discussion [Discussed intubation with patient and SO, would  want full code]  CRITICAL CARE Performed by: Leanna Sato Hilton Saephan  Total critical care time: 39 minutes  Critical care time was exclusive of separately billable procedures and treating other patients.  Critical care was necessary to treat or prevent imminent or life-threatening deterioration.  Critical care was time spent personally by me on the following activities: development of treatment plan with patient and/or surrogate as well as nursing, discussions with consultants, evaluation of patient's response to treatment, examination of patient, obtaining history from patient or surrogate, ordering and performing treatments and interventions, ordering and review of laboratory studies, ordering and review of radiographic studies, pulse oximetry and re-evaluation of patient's condition.   Kara Mead MD. Shade Flood. [facility] Pager : [phone]  If no response to pager , please call [phone] until 7 pm After 7:00 pm call Elink  [phone]    12/02/2021, 8:08 AM

## 2021-12-02 NOTE — Plan of Care (Signed)
°  Problem: Health Behavior/Discharge Planning: Goal: Ability to manage health-related needs will improve Outcome: Progressing   Problem: Clinical Measurements: Goal: Ability to maintain clinical measurements within normal limits will improve Outcome: Progressing Goal: Diagnostic test results will improve Outcome: Progressing   Problem: Activity: Goal: Risk for activity intolerance will decrease Outcome: Progressing   Problem: Nutrition: Goal: Adequate nutrition will be maintained Outcome: Progressing   Problem: Elimination: Goal: Will not experience complications related to bowel motility Outcome: Progressing

## 2021-12-02 NOTE — Progress Notes (Signed)
PT Cancellation Note  Patient Details Name: Gina Cherry MRN: [id number] DOB: October 11, 1956   Cancelled Treatment:    Reason Eval/Treat Not Completed: Medical issues which prohibited therapy. Pt remains intubated and sedated.   Zenaida Niece 12/02/2021, 2:51 PM

## 2021-12-02 NOTE — Progress Notes (Addendum)
Advanced Heart Failure Rounding Note  PCP-Cardiologist: None   Subjective:    Remains intubated. FiO2 down to 50%.   ? Co-ox 96%. NE has been weaned down to 10.   CVP 11.  Abx switched to Levaquin for PNA. Urine positive for legionella.    Objective:   Weight Range: 53.5 kg Body mass index is 20.25 kg/m.   Vital Signs:   Temp:  [98 F (36.7 C)-101.5 F (38.6 C)] 98 F (36.7 C) (02/17 0800) Pulse Rate:  [76-108] 87 (02/17 0737) Resp:  [17-26] 22 (02/17 0800) BP: (74-137)/(46-71) 122/62 (02/17 0800) SpO2:  [93 %-100 %] 97 % (02/17 0737) Arterial Line BP: (112-201)/(44-136) 160/60 (02/17 0800) FiO2 (%):  [60 %-80 %] 60 % (02/17 0737) Weight:  [53.5 kg] 53.5 kg (02/17 0400) Last BM Date : 11/29/21  Weight change: Filed Weights   11/28/21 0847 12/02/21 0400  Weight: 45.4 kg 53.5 kg    Intake/Output:   Intake/Output Summary (Last 24 hours) at 12/02/2021 1007 Last data filed at 12/02/2021 0800 Gross per 24 hour  Intake 2955.02 ml  Output 960 ml  Net 1995.02 ml      Physical Exam    CVP 11 General:  Critically ill. On vent.  HEENT: + ETT Neck: supple.  JVP 12-14. R IJ CVC. Carotids 2+ bilat; no bruits.  Cor: PMI nondisplaced. Regular rate & rhythm. No rubs, gallops or murmurs. Lungs: clear Abdomen: soft, nontender, nondistended. No hepatosplenomegaly. No bruits or masses. Good bowel sounds. Extremities: no cyanosis, clubbing, rash, edema Neuro: Sedated on vent    Telemetry   Sinus/sinus tach, 80s-100s  EKG    No EKG to review   Labs    CBC Recent Labs    12/01/21 0336 12/01/21 1035 12/02/21 0425 12/02/21 0429  WBC 17.1*  --  17.2*  --   HGB 12.0   < > 11.8* 10.2*  HCT 32.6*   < > 32.5* 30.0*  MCV 100.0  --  102.8*  --   PLT 266  --  299  --    < > = values in this interval not displayed.   Basic Metabolic Panel Recent Labs    12/01/21 1547 12/01/21 1624 12/01/21 1700 12/01/21 2049 12/02/21 0425 12/02/21 0429  NA 136   < >   --    < > 134* 135  K 3.8   < >  --    < > 3.8 3.7  CL 103  --   --   --  102  --   CO2 26  --   --   --  26  --   GLUCOSE 143*  --   --   --  156*  --   BUN 10  --   --   --  6*  --   CREATININE 0.39*  --   --   --  0.31*  --   CALCIUM 7.1*  --   --   --  7.0*  --   MG  --   --  1.8  --  2.5*  --   PHOS  --   --  1.5*  --  1.5*  --    < > = values in this interval not displayed.   Liver Function Tests Recent Labs    12/01/21 1547 12/02/21 0425  AST 131* 114*  ALT 58* 55*  ALKPHOS 44 58  BILITOT 0.5 0.3  PROT 4.5* 4.6*  ALBUMIN 1.6* 1.6*  No results for input(s): LIPASE, AMYLASE in the last 72 hours. Cardiac Enzymes No results for input(s): CKTOTAL, CKMB, CKMBINDEX, TROPONINI in the last 72 hours.   BNP: BNP (last 3 results) Recent Labs    11/30/21 0828  BNP 1,181.2*    ProBNP (last 3 results) No results for input(s): PROBNP in the last 8760 hours.   D-Dimer No results for input(s): DDIMER in the last 72 hours. Hemoglobin A1C No results for input(s): HGBA1C in the last 72 hours.  Fasting Lipid Panel Recent Labs    12/02/21 0425  TRIG 255*   Thyroid Function Tests No results for input(s): TSH, T4TOTAL, T3FREE, THYROIDAB in the last 72 hours.  Invalid input(s): FREET3  Other results:   Imaging    DG Abd 1 View  Result Date: 12/01/2021 CLINICAL DATA:  Cough, weakness EXAM: ABDOMEN - 1 VIEW COMPARISON:  11/30/2021 FINDINGS: Esophagogastric tube is positioned with tip and side port below the diaphragm, position retracted compared to prior examination. Nonobstructive pattern of included bowel gas. Layering right pleural effusion. IMPRESSION: Esophagogastric tube is positioned with tip and side port below the diaphragm, position retracted compared to prior examination. Electronically Signed   By: Delanna Ahmadi M.D.   On: 12/01/2021 11:13     Medications:     Scheduled Medications:  aspirin  81 mg Per Tube Daily   chlorhexidine  15 mL Mouth Rinse BID    chlorhexidine gluconate (MEDLINE KIT)  15 mL Mouth Rinse BID   Chlorhexidine Gluconate Cloth  6 each Topical Q0600   docusate  100 mg Per Tube BID   enoxaparin (LOVENOX) injection  30 mg Subcutaneous K53Z   folic acid  1 mg Per Tube Daily   insulin aspart  0-9 Units Subcutaneous Q4H   ipratropium-albuterol  3 mL Nebulization TID   mouth rinse  15 mL Mouth Rinse Q2H   multivitamin with minerals  1 tablet Per Tube Daily   nicotine  21 mg Transdermal Daily   pantoprazole sodium  40 mg Per Tube Daily   polyethylene glycol  17 g Per Tube Daily   thiamine  100 mg Per Tube Daily    Infusions:  sodium chloride Stopped (12/01/21 1153)   feeding supplement (VITAL AF 1.2 CAL) 50 mL/hr at 12/02/21 0409   fentaNYL infusion INTRAVENOUS 175 mcg/hr (12/02/21 0933)   levofloxacin (LEVAQUIN) IV 750 mg (12/02/21 0933)   norepinephrine (LEVOPHED) Adult infusion 19 mcg/min (12/02/21 0800)   potassium PHOSPHATE IVPB (in mmol)     propofol (DIPRIVAN) infusion 70 mcg/kg/min (12/02/21 0934)   sodium phosphate  Dextrose 5% IVPB 43 mL/hr at 12/02/21 0800    PRN Medications: Place/Maintain arterial line **AND** sodium chloride, acetaminophen, fentaNYL, ipratropium-albuterol, midazolam, midazolam    Patient Profile   66 y/o female w/ h/o tobacco and ETOH abuse (drinks ~4 glasses of wine/day), no prior cardiac history. Admitted w/ sepsis PNA and found to have reduced LV dysfunction on echo. EF 40-45% w/ WMA pattern c/w possible atypical stressed induced CM.    Assessment/Plan   1. Shock  - lactic acid 3.1>>1.7>>2.1>2.5>1.8 - primarily septic shock in the setting of b/l PNA  - NE weaned down to 10 today - c/w abx per PCCM    2. Legionella PNA - abx per PCCM - Blood cultures NGTD - PCT improving, 18>>14>>8.31   3. Acute Systolic Heart Failure  - Echo EF read 30-35% (feel more like 40-45%), RV normal (no prior study for comparison)  - Suspect likely new given WMA  pattern c/w possible stress  induced CM in the setting of acute sepsis PNA but cannot r/o possibility of preexisting CM, particularly ETOH induced CM vs Ischemic CM  - continue tx of sepsis/ PNA - no GDMT currently while on NE - plan gradual GDMT titration once stable from infectious standpoint and as BP permits  - will need repeat echo after recovery. If EF not improved, will need additional w/u to r/o other etiologies w/ [facility] +/- cMRI  - ? Co-ox 96%. Recheck - volume status ok. CVP 11. Allow higher CVP w/ sepsis  - once discharged, ETOH reduction will be imperative    5. Acute Hypoxic Respiratory Failure - b/l PNA  - intubated  - management per PCCM    6. Elevated LFTs - Suspect 2/2 shock + ETOH related  - LFTs trending down    7. Hypokalemia/ Hypomagnesemia  - K 3.8 , Mg 2.5 - supp supp K  8. Tobacco/ ETOH Abuse - cessation advised   7. Elevated HS trop  - Hs trop [phone] - cannot r/o out the possibility of out of hospital MI prior to arrival  - will need LHC to r/o ischemic heart disease once improved from septic standpoint   - Continue 81 mg aspirin   Length of Stay: 3  FINCH, LINDSAY N, PA-C  12/02/2021, 10:07 AM  Advanced Heart Failure Team Pager [phone] (M-F; 7a - 5p)  Please contact [facility] for night-coverage after hours (5p -7a ) and weekends on [email or web address]  Agree with above.   Remains intubated. No longer proned. Remains on NE. Down to 10. Co-ox high. CVP 11   General:  Sedated on vent. Ill appearing HEENT: normal + ETT Neck: supple. + central line . Cor: PMI nondisplaced. Regular rate & rhythm. No rubs, gallops or murmurs. Lungs: + crackles on R  Abdomen: soft, nontender, nondistended. No hepatosplenomegaly. No bruits or masses. Good bowel sounds. Extremities: no cyanosis, clubbing, rash, tr edema Neuro: intubated sedated  Hemodynamics improving. Wean NE as tolerated. Would not give diuretics until more stable Continue abx. Repeat echo next week. If EF remains  depressed consider R/L cath.   We will see again on Monday.  CRITICAL CARE Performed by: Glori Bickers  Total critical care time: 35 minutes  Critical care time was exclusive of separately billable procedures and treating other patients.  Critical care was necessary to treat or prevent imminent or life-threatening deterioration.  Critical care was time spent personally by me (independent of midlevel providers or residents) on the following activities: development of treatment plan with patient and/or surrogate as well as nursing, discussions with consultants, evaluation of patient's response to treatment, examination of patient, obtaining history from patient or surrogate, ordering and performing treatments and interventions, ordering and review of laboratory studies, ordering and review of radiographic studies, pulse oximetry and re-evaluation of patient's condition.  Glori Bickers, MD  5:02 PM

## 2021-12-02 NOTE — Progress Notes (Addendum)
Pt supined with no complications. Tubeholder placed on pt at this time.

## 2021-12-02 NOTE — Progress Notes (Signed)
Pt head and arms repositioned at this time without complications, head is now to the left.

## 2021-12-02 NOTE — Plan of Care (Signed)
°  Problem: Health Behavior/Discharge Planning: Goal: Ability to manage health-related needs will improve Outcome: Progressing   Problem: Clinical Measurements: Goal: Ability to maintain clinical measurements within normal limits will improve Outcome: Progressing   Problem: Activity: Goal: Risk for activity intolerance will decrease Outcome: Progressing   Problem: Nutrition: Goal: Adequate nutrition will be maintained Outcome: Progressing   Problem: Elimination: Goal: Will not experience complications related to bowel motility Outcome: Progressing

## 2021-12-02 NOTE — Progress Notes (Addendum)
eLink Physician-Brief Progress Note Patient Name: Gina Cherry DOB: 1956/02/06 MRN: [id number]   Date of Service  12/02/2021  HPI/Events of Note  ABG, labs reviewed. Hypocalcemia. Other are stable.  eICU Interventions  1gm calcium ordered. Sodium po4 also ordered, po4 low.      Intervention Category Minor Interventions: Electrolytes abnormality - evaluation and management  Ranee Gosselin 12/02/2021, 5:51 AM

## 2021-12-02 NOTE — Progress Notes (Signed)
Pharmacy Electrolyte Replacement  Recent Labs:  Recent Labs    12/02/21 0425 12/02/21 0429  K 3.8 3.7  MG 2.5*  --   PHOS 1.5*  --   CREATININE 0.31*  --     Low Critical Values (K </= 2.5, Phos </= 1, Mg </= 1) Present: None  MD Contacted: Tiburcio Pea, [place]  Plan: Give additional KPhos IV x 1 (already has NaPhos 15 mmol IV started this AM per Elink) Recheck Phos this evening   Leia Alf, PharmD, BCPS Please check AMION for all [facility] contact numbers Clinical Pharmacist 12/02/2021 7:50 AM

## 2021-12-03 ENCOUNTER — Inpatient Hospital Stay (HOSPITAL_COMMUNITY): Payer: Medicare HMO

## 2021-12-03 DIAGNOSIS — J9601 Acute respiratory failure with hypoxia: Secondary | ICD-10-CM | POA: Diagnosis not present

## 2021-12-03 LAB — CBC
HCT: 29.5 % — ABNORMAL LOW (ref 36.0–46.0)
Hemoglobin: 10 g/dL — ABNORMAL LOW (ref 12.0–15.0)
MCH: 35.8 pg — ABNORMAL HIGH (ref 26.0–34.0)
MCHC: 33.9 g/dL (ref 30.0–36.0)
MCV: 105.7 fL — ABNORMAL HIGH (ref 80.0–100.0)
Platelets: 278 10*3/uL (ref 150–400)
RBC: 2.79 MIL/uL — ABNORMAL LOW (ref 3.87–5.11)
RDW: 15.3 % (ref 11.5–15.5)
WBC: 19.1 10*3/uL — ABNORMAL HIGH (ref 4.0–10.5)
nRBC: 0 % (ref 0.0–0.2)

## 2021-12-03 LAB — GLUCOSE, CAPILLARY
Glucose-Capillary: 123 mg/dL — ABNORMAL HIGH (ref 70–99)
Glucose-Capillary: 125 mg/dL — ABNORMAL HIGH (ref 70–99)
Glucose-Capillary: 135 mg/dL — ABNORMAL HIGH (ref 70–99)
Glucose-Capillary: 156 mg/dL — ABNORMAL HIGH (ref 70–99)
Glucose-Capillary: 162 mg/dL — ABNORMAL HIGH (ref 70–99)
Glucose-Capillary: 180 mg/dL — ABNORMAL HIGH (ref 70–99)

## 2021-12-03 LAB — BASIC METABOLIC PANEL
Anion gap: 7 (ref 5–15)
BUN: 6 mg/dL — ABNORMAL LOW (ref 8–23)
CO2: 28 mmol/L (ref 22–32)
Calcium: 7.2 mg/dL — ABNORMAL LOW (ref 8.9–10.3)
Chloride: 100 mmol/L (ref 98–111)
Creatinine, Ser: 0.37 mg/dL — ABNORMAL LOW (ref 0.44–1.00)
GFR, Estimated: >60 mL/min (ref >60)
Glucose, Bld: 160 mg/dL — ABNORMAL HIGH (ref 70–99)
Potassium: 4.7 mmol/L (ref 3.5–5.1)
Sodium: 135 mmol/L (ref 135–145)

## 2021-12-03 LAB — POCT I-STAT 7, (LYTES, BLD GAS, ICA,H+H)
Acid-Base Excess: 2 mmol/L (ref 0.0–2.0)
Bicarbonate: 28.2 mmol/L — ABNORMAL HIGH (ref 20.0–28.0)
Calcium, Ion: 1.13 mmol/L — ABNORMAL LOW (ref 1.15–1.40)
HCT: 27 % — ABNORMAL LOW (ref 36.0–46.0)
Hemoglobin: 9.2 g/dL — ABNORMAL LOW (ref 12.0–15.0)
O2 Saturation: 94 %
Patient temperature: 98.7
Potassium: 4.6 mmol/L (ref 3.5–5.1)
Sodium: 135 mmol/L (ref 135–145)
TCO2: 30 mmol/L (ref 22–32)
pCO2 arterial: 50.8 mmHg — ABNORMAL HIGH (ref 32–48)
pH, Arterial: 7.352 (ref 7.35–7.45)
pO2, Arterial: 78 mmHg — ABNORMAL LOW (ref 83–108)

## 2021-12-03 LAB — COOXEMETRY PANEL
Carboxyhemoglobin: 2.1 % — ABNORMAL HIGH (ref 0.5–1.5)
Methemoglobin: <0.7 % (ref 0.0–1.5)
O2 Saturation: 88.5 %
Total hemoglobin: 10.3 g/dL — ABNORMAL LOW (ref 12.0–16.0)

## 2021-12-03 LAB — MAGNESIUM: Magnesium: 2.3 mg/dL (ref 1.7–2.4)

## 2021-12-03 LAB — CULTURE, BLOOD (ROUTINE X 2)
Culture: NO GROWTH
Culture: NO GROWTH
Culture: NO GROWTH
Culture: NO GROWTH

## 2021-12-03 LAB — CULTURE, RESPIRATORY W GRAM STAIN

## 2021-12-03 LAB — PHOSPHORUS: Phosphorus: 1.8 mg/dL — ABNORMAL LOW (ref 2.5–4.6)

## 2021-12-03 MED ORDER — FUROSEMIDE 10 MG/ML IJ SOLN
40.0000 mg | Freq: Once | INTRAMUSCULAR | Status: AC
  Administered 2021-12-03: 40 mg via INTRAVENOUS
  Filled 2021-12-03: qty 4

## 2021-12-03 MED ORDER — SODIUM CHLORIDE 0.9 % IV SOLN: INTRAVENOUS | Status: DC | PRN

## 2021-12-03 MED ORDER — OXYCODONE HCL 5 MG PO TABS
5.0000 mg | ORAL_TABLET | Freq: Four times a day (QID) | ORAL | Status: DC
  Administered 2021-12-03 – 2021-12-04 (×3): 5 mg
  Filled 2021-12-03 (×3): qty 1

## 2021-12-03 MED ORDER — INSULIN GLARGINE-YFGN 100 UNIT/ML ~~LOC~~ SOLN
5.0000 [IU] | Freq: Every day | SUBCUTANEOUS | Status: DC
  Administered 2021-12-03 – 2021-12-18 (×16): 5 [IU] via SUBCUTANEOUS
  Filled 2021-12-03 (×18): qty 0.05

## 2021-12-03 MED ORDER — SODIUM PHOSPHATES 45 MMOLE/15ML IV SOLN
30.0000 mmol | Freq: Once | INTRAVENOUS | Status: AC
  Administered 2021-12-03: 30 mmol via INTRAVENOUS
  Filled 2021-12-03: qty 10

## 2021-12-03 MED ORDER — DEXMEDETOMIDINE HCL IN NACL 400 MCG/100ML IV SOLN
0.4000 ug/kg/h | INTRAVENOUS | Status: DC
  Administered 2021-12-03: 0.4 ug/kg/h via INTRAVENOUS
  Filled 2021-12-03 (×2): qty 100

## 2021-12-03 MED ORDER — CLONAZEPAM 0.25 MG PO TBDP
0.5000 mg | ORAL_TABLET | Freq: Two times a day (BID) | ORAL | Status: DC
  Administered 2021-12-03 – 2021-12-04 (×2): 0.5 mg
  Filled 2021-12-03: qty 2

## 2021-12-03 MED ORDER — CLONAZEPAM 0.25 MG PO TBDP
0.5000 mg | ORAL_TABLET | Freq: Two times a day (BID) | ORAL | Status: DC
  Filled 2021-12-03: qty 2

## 2021-12-03 NOTE — Progress Notes (Incomplete)
[place] effusion Sedation plan Trial of diuretic

## 2021-12-03 NOTE — Progress Notes (Signed)
NAME:  BRANNON DECAIRE, MRN:  [id number], DOB:  12-Oct-1956, LOS: 4 ADMISSION DATE:  11/28/2021, CONSULTATION DATE:  12/03/21 REFERRING MD:  Lysbeth Galas, CHIEF COMPLAINT:   worsening hypoxia/dyspnea  History of Present Illness:  Gina Cherry is a 66 y.o. F with PMH of tobacco and ETOH use who presented with CAP ,bilateral pneumonia with WBC 13k and mildly elevated LFT' s along with hypokalemia.  She was admitted to the floor, intubated 2/15 for ARDS Course complicated by shock, stress cardiomyopathy  She smoking 1/2 ppd for 40+ years  Pertinent  Medical History  Tobacco and ETOH use   Significant Hospital Events: Including procedures, antibiotic start and stop dates in addition to other pertinent events   2/13 admitted to teaching service, later required HFNC and ICU trxfr, On Vanc/Ceftriaxone/Azithromycin 2/14 wbc ct up. Vancs stopped as MRSA PCR neg  ECHO Mderately reduced LV function with regional  wall motion abnormalities, 30-35%. There is hypokinesis of the basal to  mid segments of all walls of the LV. There is apical hyperkinesis. This pattern could represent an atypical  stress induced cardiomyopathy pattern as this spares the apex  (non-coronary distributions).  2/15 increased O2 needs. CXR worse. Mottled. Concern for shock. Vanc added back. Right IJ CVL placed. Right radial aline placed. Cards consulted. Intubated. BAL sent  2/16 remains intubated with high vent settings including FiO2 70% PEEP 10.  Lower extremities no longer mottled.  Proned for worsening ARDS 2/17 un-proned.  Started on levaquin, urine Legionella positive, down to 60%/ 10 peep, ongoing levophed  Interim History / Subjective:  Tmax 103 Remains on NE 11 mcg/min Sedated on propofol/ fentanyl, moves all extremities when sedation lowered  CVP 10, coox 88 No events overnight  Objective   Blood pressure (!) 101/51, pulse 89, temperature 98.7 F (37.1 C), temperature source Oral, resp. rate (!) 22, height  _0  (1.626 m), weight 55.2 kg, SpO2 96 %. CVP:  [2 mmHg-18 mmHg] 12 mmHg  Vent Mode: PRVC FiO2 (%):  [50 %-60 %] 50 % Set Rate:  [22 bmp] 22 bmp Vt Set:  [430 mL] 430 mL PEEP:  [10 cmH20] 10 cmH20 Plateau Pressure:  [19 cmH20-20 cmH20] 19 cmH20   Intake/Output Summary (Last 24 hours) at 12/03/2021 0732 Last data filed at 12/03/2021 0700 Gross per 24 hour  Intake 3155.63 ml  Output 945 ml  Net 2210.63 ml   Filed Weights   11/28/21 0847 12/02/21 0400 12/03/21 0400  Weight: 45.4 kg 53.5 kg 55.2 kg   Physical Exam: sedated on fentanyl 175, propofol 59 General:  critically ill older female sedated/ intubated on mechanical ventilation in no distress HEENT: MM pink/dry, ETT, OGT, pupils 3/reactive, scleral edema Neuro:  sedated, does not f/c CV: rr ir, NSR occasional PAC, no murmur, distal pulses +1 PULM:  MV supported breaths, coarse, right posterior base rales, plat 19, dp 9 GI: soft, +bs, ND, foley- cyu Extremities: warm/dry, generalized edema +2 Skin: bruising in extremities   2/18 CXR> stable lines, better aeration on right, residual patchy infiltrates, worse on R  UOP 929m/ 24hrs +2.1L/ 24hrs, net +14.9L Wts 45.4> 53.5> 55.2  ABG 7.352/ 50.8/78/28.2  overall improving Labs reviewed: K 4.7, sCr 0.37, iCa 1.13, phos 1.8, WBC 19.1, Hgb 10, MCV 105.7  Resolved Hospital Problem list   New acute metabolic acidosis 2/2 sepsis +/- ETOH w/d  Assessment & Plan:  Principal Problem:   CAP (community acquired pneumonia) Active Problems:   [place] shock [place]   Weakness  Acute respiratory failure with hypoxia (HCC)   Elevated LFTs   Tobacco abuse   Risk for falls   Protein-calorie malnutrition, severe   Takotsubo cardiomyopathy   Malnutrition of moderate degree   ARDS secondary to Legionella PNA Long term tobacco use -2/16 proned >2/17 P: - Continue MV support, 8cc/kg IBW with goal Pplat <30 and DP<15  - O2/ peep needs continue to improve - VAP prevention protocol/  PPI - PAD protocol for sedation> fentanyl/ propofol with scheduled bowel regimen.  Will try and lower RASS goals to -1/-2 as long as patient remains synchronous with MV.  Trying to wean down propofol given triglycerides 255.  Would like to add some enteral sedation,  - wean FiO2 as able for SpO2 >88%  - intermittent CXR - follow 2/15 BAL > no growth thus far - continue levaquin for 10 days of therapy, stop date 2/26 - duonebs TID and prn    Septic shock secondary to PNA c/b new New stress induced CM (EF 30-35%)  Legionella pneumonia P: - weaning NE for MAP goal > 65 - trending CVP - coox remain high- 88%.   - levaquin started 2/17, follow culture data and BAL  Likely right pleural effusion, parapneumonic - bedside assessment of right effusion today, appears less prevalent on todays CXR  - she is volume up but able to start diuretics yet given ongoing pressor needs.    Likely COPD underlying - duonebs as above - will need f/u in pulmonary office for PFTs and follow-up  New systolic congestive heart failure - Echo with EF of 30 to 35% with apical hypokinesis, pattern could represent atypical stress-induced cardiomyopathy vs pre-existing, RV normal - Suspect new decreased EF secondary to critical illness vs pre-existing CM (? ETOH related) vs ischemic CM - did not tolerate dobutamine Sinus tachycardia versus atypical atrial flutter P: - HF following, appreciate recs  - will need ischemic evaluation prior to discharge - GDMT once off pressors - Will given lasix x1 today given her hypervolemia.  May help with weaning vent.   - strict I/Os, daily wts, UOP   Fluid & Electrolyte imbalance: Hyponatremia, hypokalemia, hypophosphatemia P: - K 4.7, will do sodium phos - trend BMET/ mag/ phos  Elevated LFT's Alcohol Use -Acute hepatitis panel neg; slowly improving  P: - LFTs downtrending.  Repeat 2/19.   - Avoid hepatotoxins - continue thiamine, folate, and multivitamin -  Cessation education when appropriate  Fall risk P: When appropriate PT/OT  Hyperglycemia - cont SSI sensitive, add 5 units daily semglee given glucose trend > 180  Best Practice (right click and "Reselect all SmartList Selections" daily)   Diet/type: tubefeeds DVT prophylaxis: LMWH GI prophylaxis: PPI Lines: Central line and Arterial Line (R IJ and left radial) Foley:  Yes, and it is still needed Code Status:  full code Last date of multidisciplinary goals of care discussion [Discussed intubation with patient and SO, would want full code]  No family at bedside   CRITICAL CARE Performed by: Kennieth Rad  Total critical care time: 35 minutes  Critical care time was exclusive of separately billable procedures and treating other patients.  Critical care was necessary to treat or prevent imminent or life-threatening deterioration.  Critical care was time spent personally by me on the following activities: development of treatment plan with patient and/or surrogate as well as nursing, discussions with consultants, evaluation of patient's response to treatment, examination of patient, obtaining history from patient or surrogate, ordering and performing treatments and interventions,  ordering and review of laboratory studies, ordering and review of radiographic studies, pulse oximetry and re-evaluation of patient's condition.   Kennieth Rad, ACNP [facility] 12/03/2021, 7:32 AM  See Amion for pager If no response to pager, please call PCCM consult pager After 7:00 pm call Elink

## 2021-12-03 NOTE — Progress Notes (Signed)
During patient bath A-line became dislodged; CCM and RT notified. Per CCM, told to d/c A-line. RT notified. Will continue to monitor.

## 2021-12-04 DIAGNOSIS — J189 Pneumonia, unspecified organism: Secondary | ICD-10-CM | POA: Diagnosis not present

## 2021-12-04 DIAGNOSIS — J9601 Acute respiratory failure with hypoxia: Secondary | ICD-10-CM | POA: Diagnosis not present

## 2021-12-04 DIAGNOSIS — J9602 Acute respiratory failure with hypercapnia: Secondary | ICD-10-CM | POA: Diagnosis not present

## 2021-12-04 DIAGNOSIS — A481 Legionnaires' disease: Secondary | ICD-10-CM | POA: Diagnosis not present

## 2021-12-04 LAB — CBC
HCT: 26.1 % — ABNORMAL LOW (ref 36.0–46.0)
Hemoglobin: 9.2 g/dL — ABNORMAL LOW (ref 12.0–15.0)
MCH: 36.8 pg — ABNORMAL HIGH (ref 26.0–34.0)
MCHC: 35.2 g/dL (ref 30.0–36.0)
MCV: 104.4 fL — ABNORMAL HIGH (ref 80.0–100.0)
Platelets: 273 10*3/uL (ref 150–400)
RBC: 2.5 MIL/uL — ABNORMAL LOW (ref 3.87–5.11)
RDW: 15.3 % (ref 11.5–15.5)
WBC: 15.7 10*3/uL — ABNORMAL HIGH (ref 4.0–10.5)
nRBC: 0.2 % (ref 0.0–0.2)

## 2021-12-04 LAB — POCT I-STAT 7, (LYTES, BLD GAS, ICA,H+H)
Acid-Base Excess: 8 mmol/L — ABNORMAL HIGH (ref 0.0–2.0)
Bicarbonate: 33.5 mmol/L — ABNORMAL HIGH (ref 20.0–28.0)
Calcium, Ion: 1.09 mmol/L — ABNORMAL LOW (ref 1.15–1.40)
HCT: 26 % — ABNORMAL LOW (ref 36.0–46.0)
Hemoglobin: 8.8 g/dL — ABNORMAL LOW (ref 12.0–15.0)
O2 Saturation: 91 %
Patient temperature: 99.6
Potassium: 4.2 mmol/L (ref 3.5–5.1)
Sodium: 134 mmol/L — ABNORMAL LOW (ref 135–145)
TCO2: 35 mmol/L — ABNORMAL HIGH (ref 22–32)
pCO2 arterial: 52.6 mmHg — ABNORMAL HIGH (ref 32–48)
pH, Arterial: 7.415 (ref 7.35–7.45)
pO2, Arterial: 63 mmHg — ABNORMAL LOW (ref 83–108)

## 2021-12-04 LAB — GLUCOSE, CAPILLARY
Glucose-Capillary: 115 mg/dL — ABNORMAL HIGH (ref 70–99)
Glucose-Capillary: 140 mg/dL — ABNORMAL HIGH (ref 70–99)
Glucose-Capillary: 123 mg/dL — ABNORMAL HIGH (ref 70–99)
Glucose-Capillary: 123 mg/dL — ABNORMAL HIGH (ref 70–99)

## 2021-12-04 LAB — BASIC METABOLIC PANEL
Anion gap: 8 (ref 5–15)
BUN: 9 mg/dL (ref 8–23)
CO2: 30 mmol/L (ref 22–32)
Calcium: 7 mg/dL — ABNORMAL LOW (ref 8.9–10.3)
Chloride: 94 mmol/L — ABNORMAL LOW (ref 98–111)
Creatinine, Ser: 0.41 mg/dL — ABNORMAL LOW (ref 0.44–1.00)
GFR, Estimated: >60 mL/min (ref >60)
Glucose, Bld: 124 mg/dL — ABNORMAL HIGH (ref 70–99)
Potassium: 4.3 mmol/L (ref 3.5–5.1)
Sodium: 132 mmol/L — ABNORMAL LOW (ref 135–145)

## 2021-12-04 LAB — MAGNESIUM: Magnesium: 1.8 mg/dL (ref 1.7–2.4)

## 2021-12-04 LAB — PHOSPHORUS: Phosphorus: 2 mg/dL — ABNORMAL LOW (ref 2.5–4.6)

## 2021-12-04 MED ORDER — OXYCODONE-ACETAMINOPHEN 5-325 MG PO TABS
2.0000 | ORAL_TABLET | Freq: Three times a day (TID) | ORAL | Status: DC
  Administered 2021-12-04 – 2021-12-09 (×18): 2
  Filled 2021-12-04 (×19): qty 2

## 2021-12-04 MED ORDER — CLONAZEPAM 0.5 MG PO TABS
0.5000 mg | ORAL_TABLET | Freq: Once | ORAL | Status: AC
  Administered 2021-12-04: 0.5 mg
  Filled 2021-12-04: qty 1

## 2021-12-04 MED ORDER — CALCIUM GLUCONATE-NACL 1-0.675 GM/50ML-% IV SOLN
1.0000 g | Freq: Once | INTRAVENOUS | Status: AC
  Administered 2021-12-04: 1000 mg via INTRAVENOUS
  Filled 2021-12-04: qty 50

## 2021-12-04 MED ORDER — CLONAZEPAM 0.25 MG PO TBDP
1.0000 mg | ORAL_TABLET | Freq: Two times a day (BID) | ORAL | Status: DC
  Administered 2021-12-04 – 2021-12-07 (×7): 1 mg
  Filled 2021-12-04 (×7): qty 4

## 2021-12-04 MED ORDER — CHLORHEXIDINE GLUCONATE 0.12% ORAL RINSE (MEDLINE KIT)
15.0000 mL | Freq: Two times a day (BID) | OROMUCOSAL | Status: DC
  Administered 2021-12-05 – 2021-12-10 (×12): 15 mL via OROMUCOSAL

## 2021-12-04 MED ORDER — FUROSEMIDE 10 MG/ML IJ SOLN
40.0000 mg | Freq: Once | INTRAMUSCULAR | Status: AC
  Administered 2021-12-04: 40 mg via INTRAVENOUS
  Filled 2021-12-04: qty 4

## 2021-12-04 MED ORDER — POTASSIUM PHOSPHATES 15 MMOLE/5ML IV SOLN
30.0000 mmol | Freq: Once | INTRAVENOUS | Status: AC
  Administered 2021-12-04: 30 mmol via INTRAVENOUS
  Filled 2021-12-04: qty 10

## 2021-12-04 MED ORDER — MAGNESIUM SULFATE 2 GM/50ML IV SOLN
2.0000 g | Freq: Once | INTRAVENOUS | Status: AC
  Administered 2021-12-04: 2 g via INTRAVENOUS
  Filled 2021-12-04: qty 50

## 2021-12-04 MED ORDER — ORAL CARE MOUTH RINSE
15.0000 mL | OROMUCOSAL | Status: DC
  Administered 2021-12-04 – 2021-12-10 (×58): 15 mL via OROMUCOSAL

## 2021-12-04 NOTE — Progress Notes (Signed)
NAME:  Gina Cherry, MRN:  [id number], DOB:  01-Dec-1955, LOS: 5 ADMISSION DATE:  11/28/2021, CONSULTATION DATE:  12/04/21 REFERRING MD:  Lysbeth Galas, CHIEF COMPLAINT:   worsening hypoxia/dyspnea  History of Present Illness:  Gina Cherry is a 66 y.o. F with PMH of tobacco and ETOH use who presented with CAP ,bilateral pneumonia with WBC 13k and mildly elevated LFT' s along with hypokalemia.  She was admitted to the floor, intubated 2/15 for ARDS Course complicated by shock, stress cardiomyopathy  She smoking 1/2 ppd for 40+ years  Pertinent  Medical History  Tobacco and ETOH use   Significant Hospital Events: Including procedures, antibiotic start and stop dates in addition to other pertinent events   2/13 admitted to teaching service, later required HFNC and ICU trxfr, On Vanc/Ceftriaxone/Azithromycin 2/14 wbc ct up. Vancs stopped as MRSA PCR neg  ECHO Mderately reduced LV function with regional  wall motion abnormalities, 30-35%. There is hypokinesis of the basal to  mid segments of all walls of the LV. There is apical hyperkinesis. This pattern could represent an atypical  stress induced cardiomyopathy pattern as this spares the apex  (non-coronary distributions).  2/15 increased O2 needs. CXR worse. Mottled. Concern for shock. Vanc added back. Right IJ CVL placed. Right radial aline placed. Cards consulted. Intubated. BAL sent  2/16 remains intubated with high vent settings including FiO2 70% PEEP 10.  Lower extremities no longer mottled.  Proned for worsening ARDS 2/17 un-proned.  Started on levaquin, urine Legionella positive, down to 60%/ 10 peep, ongoing levophed 2/18 tmax 103, NE at 11 mcg, coox 88%, CVP 10, diuresed, down down 40%/ 8  Interim History / Subjective:  CVP 6 Remains on 50%/8 peep Diuresed 2.5L yest Did not respond to precedex yesterday, placed back on propofol  Tmax 99.6 On WUA- MAE and follows some commands   Objective   Blood pressure (!) 118/55,  pulse (!) 114, temperature 99.6 F (37.6 C), resp. rate (!) 23, height _0  (1.626 m), weight 56 kg, SpO2 91 %. CVP:  [2 mmHg-15 mmHg] 4 mmHg  Vent Mode: PRVC FiO2 (%):  [40 %-50 %] 50 % Set Rate:  [22 bmp] 22 bmp Vt Set:  [430 mL] 430 mL PEEP:  [8 cmH20] 8 cmH20 Plateau Pressure:  [17 cmH20-18 cmH20] 17 cmH20   Intake/Output Summary (Last 24 hours) at 12/04/2021 1134 Last data filed at 12/04/2021 1100 Gross per 24 hour  Intake 2696.39 ml  Output 2555 ml  Net 141.39 ml   Filed Weights   12/02/21 0400 12/03/21 0400 12/04/21 0500  Weight: 53.5 kg 55.2 kg 56 kg   Physical Exam:  Propofol 40, fent 175 General:  critically ill elderly female in bed in NAD, sedated HEENT: MM pink/moist, ETT/ OGT, pupils 3/reactive, cont scleral edema Neuro:  sedated- sedation paused, was able to wiggle toes/ thumbs up CV:  ST, no murmur PULM:  non labored on MV, coarse throughout, small amount of thick yellow secretions, no wheezing GI:  less soft but +bs and +BM yesterday, foley  Extremities: warm/dry, generalized 1-2+ edema    UOP 2.5L ml/ 24hrs +261m/ net +15L Wts 45.4> 53.5> 55.2> 56  Labs reviewed: pending BMET, phos 2, mag 1.8, hgb stable, WBC 19.1> 15.7   Resolved Hospital Problem list   New acute metabolic acidosis 2/2 sepsis +/- ETOH w/d  Assessment & Plan:  Principal Problem:   CAP (community acquired pneumonia) Active Problems:   [place]   Weakness   Acute  respiratory failure with hypoxia (HCC)   Elevated LFTs   Tobacco abuse   Risk for falls   Protein-calorie malnutrition, severe   Takotsubo cardiomyopathy   Malnutrition of moderate degree  ARDS secondary to Legionella PNA Long term tobacco use -2/16 proned >2/17 P: - Continue MV support, 8cc/kg IBW with goal Pplat <30 and DP<15  - O2/ peep needs continue to slowly improve - VAP prevention protocol/ PPI - PAD protocol for sedation> did not respond to precedex.  Cont fentanyl/ propofol.  Enteral sedation  started 2/18 w/ scheduled bowel regimen.  Increasing klonopin and changing to perocet TID for multimodal pain control in hopes to greatly reduce IV sedation - wean FiO2 as able for SpO2 >88%  - hopeful to start SBT trials in am - intermittent CXR - follow 2/15 BAL > rare candida albicans - continue levaquin for 10 days of therapy, stop date 2/26 - duonebs TID and prn  - diureses as below, improved oxygenation after lasix 2/18  Septic shock secondary to PNA c/b new New stress induced CM (EF 30-35%)  Legionella pneumonia P: - decreasing vasopressor requirements despite diuresis yesterday, cont NE for MAP goal > 65 - trending CVP - if renal function stable, diurese again today - coox has remained high, reassuring  - levaquin started 2/17, follow culture data and BAL  Likely right pleural effusion, parapneumonic - assessed on bedside US 2/18> consolidated lung, minimal effusion, not good window to safely consider thora.  Continue diuresis for now.      Likely COPD underlying - duonebs as above - will need f/u in pulmonary office for PFTs and follow-up  New systolic congestive heart failure Sinus tachycardia versus atypical atrial flutter - Echo with EF of 30 to 35% with apical hypokinesis, pattern could represent atypical stress-induced cardiomyopathy vs pre-existing, RV normal - Suspect new decreased EF secondary to critical illness vs pre-existing CM (? ETOH related) vs ischemic CM - did not tolerate dobutamine P: - HF following, appreciate recs  - will need ischemic eval prior to discharge - GDMT once off pressors - consider additional lasix today pending bmet   - strict I/Os, daily wts, UOP   Fluid & Electrolyte imbalance: Hyponatremia, hypokalemia, hypophosphatemia, hypocalcemia  P: - continue to aggressive replete electrolytes> calcium gluconate, kphos, mag - goal K>4, Mag> 2 - trend BMET/ mag/ phos  Elevated LFT's Alcohol Use -Acute hepatitis panel neg; slowly  improving  P: - LFTs downtrending - Avoid hepatotoxins - continue thiamine, folate, and multivitamin - Cessation education when appropriate  Fall risk P: When appropriate PT/OT  Hyperglycemia - cont SSI sensitive, semglee 5 units daily  Best Practice (right click and "Reselect all SmartList Selections" daily)   Diet/type: tubefeeds DVT prophylaxis: LMWH GI prophylaxis: PPI Lines: Central line and Arterial Line (R IJ and left radial) Foley:  Yes, and it is still needed Code Status:  full code Last date of multidisciplinary goals of care discussion [Discussed intubation with patient and SO, would want full code]  Husband updated at bedside 2/19.   CRITICAL CARE Performed by: Kennieth Rad  Total critical care time: 35 minutes  Critical care time was exclusive of separately billable procedures and treating other patients.  Critical care was necessary to treat or prevent imminent or life-threatening deterioration.  Critical care was time spent personally by me on the following activities: development of treatment plan with patient and/or surrogate as well as nursing, discussions with consultants, evaluation of patient's response to treatment, examination of patient, obtaining  history from patient or surrogate, ordering and performing treatments and interventions, ordering and review of laboratory studies, ordering and review of radiographic studies, pulse oximetry and re-evaluation of patient's condition.   Kennieth Rad, ACNP [facility] 12/04/2021, 11:34 AM  See Amion for pager If no response to pager, please call PCCM consult pager After 7:00 pm call Elink

## 2021-12-05 ENCOUNTER — Inpatient Hospital Stay (HOSPITAL_COMMUNITY): Payer: Medicare HMO

## 2021-12-05 DIAGNOSIS — A419 Sepsis, unspecified organism: Secondary | ICD-10-CM | POA: Diagnosis not present

## 2021-12-05 DIAGNOSIS — I5021 Acute systolic (congestive) heart failure: Secondary | ICD-10-CM

## 2021-12-05 DIAGNOSIS — I5181 Takotsubo syndrome: Secondary | ICD-10-CM | POA: Diagnosis not present

## 2021-12-05 DIAGNOSIS — J9601 Acute respiratory failure with hypoxia: Secondary | ICD-10-CM | POA: Diagnosis not present

## 2021-12-05 DIAGNOSIS — R6521 Severe sepsis with septic shock: Secondary | ICD-10-CM | POA: Diagnosis not present

## 2021-12-05 LAB — RENAL FUNCTION PANEL
Albumin: <1.5 g/dL — ABNORMAL LOW (ref 3.5–5.0)
Anion gap: 9 (ref 5–15)
BUN: 11 mg/dL (ref 8–23)
CO2: 35 mmol/L — ABNORMAL HIGH (ref 22–32)
Calcium: 7.5 mg/dL — ABNORMAL LOW (ref 8.9–10.3)
Chloride: 90 mmol/L — ABNORMAL LOW (ref 98–111)
Creatinine, Ser: 0.42 mg/dL — ABNORMAL LOW (ref 0.44–1.00)
GFR, Estimated: >60 mL/min (ref >60)
Glucose, Bld: 126 mg/dL — ABNORMAL HIGH (ref 70–99)
Phosphorus: 2.3 mg/dL — ABNORMAL LOW (ref 2.5–4.6)
Potassium: 4.1 mmol/L (ref 3.5–5.1)
Sodium: 134 mmol/L — ABNORMAL LOW (ref 135–145)

## 2021-12-05 LAB — CBC
HCT: 28.4 % — ABNORMAL LOW (ref 36.0–46.0)
Hemoglobin: 9.6 g/dL — ABNORMAL LOW (ref 12.0–15.0)
MCH: 36.1 pg — ABNORMAL HIGH (ref 26.0–34.0)
MCHC: 33.8 g/dL (ref 30.0–36.0)
MCV: 106.8 fL — ABNORMAL HIGH (ref 80.0–100.0)
Platelets: 283 10*3/uL (ref 150–400)
RBC: 2.66 MIL/uL — ABNORMAL LOW (ref 3.87–5.11)
RDW: 15.2 % (ref 11.5–15.5)
WBC: 14.8 10*3/uL — ABNORMAL HIGH (ref 4.0–10.5)
nRBC: 0.1 % (ref 0.0–0.2)

## 2021-12-05 LAB — ECHOCARDIOGRAM LIMITED
Height: 64 in
Weight: 1975.32 oz

## 2021-12-05 LAB — GLUCOSE, CAPILLARY
Glucose-Capillary: 102 mg/dL — ABNORMAL HIGH (ref 70–99)
Glucose-Capillary: 112 mg/dL — ABNORMAL HIGH (ref 70–99)
Glucose-Capillary: 127 mg/dL — ABNORMAL HIGH (ref 70–99)
Glucose-Capillary: 128 mg/dL — ABNORMAL HIGH (ref 70–99)
Glucose-Capillary: 145 mg/dL — ABNORMAL HIGH (ref 70–99)
Glucose-Capillary: 151 mg/dL — ABNORMAL HIGH (ref 70–99)
Glucose-Capillary: 87 mg/dL (ref 70–99)

## 2021-12-05 LAB — TRIGLYCERIDES: Triglycerides: 205 mg/dL — ABNORMAL HIGH (ref <150)

## 2021-12-05 LAB — MAGNESIUM: Magnesium: 2.3 mg/dL (ref 1.7–2.4)

## 2021-12-05 MED ORDER — SODIUM PHOSPHATES 45 MMOLE/15ML IV SOLN
30.0000 mmol | Freq: Once | INTRAVENOUS | Status: AC
  Administered 2021-12-05: 30 mmol via INTRAVENOUS
  Filled 2021-12-05: qty 10

## 2021-12-05 MED ORDER — ACETAZOLAMIDE SODIUM 500 MG IJ SOLR
250.0000 mg | Freq: Two times a day (BID) | INTRAMUSCULAR | Status: AC
  Administered 2021-12-05 (×2): 250 mg via INTRAVENOUS
  Filled 2021-12-05 (×2): qty 250

## 2021-12-05 MED ORDER — STERILE WATER FOR INJECTION IJ SOLN
INTRAMUSCULAR | Status: AC
  Administered 2021-12-05: 5 mL
  Filled 2021-12-05: qty 10

## 2021-12-05 MED ORDER — METOPROLOL TARTRATE 25 MG/10 ML ORAL SUSPENSION
25.0000 mg | Freq: Two times a day (BID) | ORAL | Status: DC
  Administered 2021-12-05 – 2021-12-07 (×6): 25 mg
  Filled 2021-12-05 (×6): qty 10

## 2021-12-05 NOTE — Progress Notes (Signed)
°  Echocardiogram 2D Echocardiogram has been performed.  Leta Jungling M 12/05/2021, 1:06 PM

## 2021-12-05 NOTE — Progress Notes (Signed)
NAME:  Gina Cherry, MRN:  [id number], DOB:  Aug 20, 1956, LOS: 6 ADMISSION DATE:  11/28/2021, CONSULTATION DATE:  12/05/21 REFERRING MD:  Lysbeth Galas, CHIEF COMPLAINT:   worsening hypoxia/dyspnea  History of Present Illness:  Gina Cherry is a 66 y.o. F with PMH of tobacco and ETOH use who presented with CAP ,bilateral pneumonia with WBC 13k and mildly elevated LFT' s along with hypokalemia.  She was admitted to the floor, intubated 2/15 for ARDS Course complicated by shock, stress cardiomyopathy  She smoking 1/2 ppd for 40+ years  Pertinent  Medical History  Tobacco and ETOH use   Significant Hospital Events: Including procedures, antibiotic start and stop dates in addition to other pertinent events   2/13 admitted to teaching service, later required HFNC and ICU trxfr, On Vanc/Ceftriaxone/Azithromycin 2/14 wbc ct up. Vancs stopped as MRSA PCR neg  ECHO Mderately reduced LV function with regional  wall motion abnormalities, 30-35%. There is hypokinesis of the basal to  mid segments of all walls of the LV. There is apical hyperkinesis. This pattern could represent an atypical  stress induced cardiomyopathy pattern as this spares the apex  (non-coronary distributions).  2/15 increased O2 needs. CXR worse. Mottled. Concern for shock. Vanc added back. Right IJ CVL placed. Right radial aline placed. Cards consulted. Intubated. BAL sent  2/16 remains intubated with high vent settings including FiO2 70% PEEP 10.  Lower extremities no longer mottled.  Proned for worsening ARDS 2/17 un-proned.  Started on levaquin, urine Legionella positive, down to 60%/ 10 peep, ongoing levophed 2/18 tmax 103, NE at 11 mcg, coox 88%, CVP 10, diuresed, down down 40%/ 8  Interim History / Subjective:  CVP 6 Remains on 50%/8 peep Diuresed 2.9L yest Tmax 100.5 On [facility]- MAE and follows commands   Objective   Blood pressure (!) 149/70, pulse (!) 129, temperature (!) 100.5 F (38.1 C), temperature source  Axillary, resp. rate (!) 24, height _0  (1.626 m), weight 56 kg, SpO2 92 %. CVP:  [3 mmHg-26 mmHg] 14 mmHg  Vent Mode: PSV;CPAP FiO2 (%):  [50 %] 50 % Set Rate:  [22 bmp] 22 bmp Vt Set:  [430 mL] 430 mL PEEP:  [8 cmH20] 8 cmH20 Pressure Support:  [10 cmH20] 10 cmH20 Plateau Pressure:  [14 cmH20-19 cmH20] 19 cmH20   Intake/Output Summary (Last 24 hours) at 12/05/2021 1044 Last data filed at 12/05/2021 1000 Gross per 24 hour  Intake 2542.76 ml  Output 2890 ml  Net -347.24 ml   Filed Weights   12/03/21 0400 12/04/21 0500 12/05/21 0500  Weight: 55.2 kg 56 kg 56 kg   Physical Exam:  Fent 175 General:  critically ill elderly female in bed in NAD, sedated HEENT: MM pink/moist, ETT/ OGT, pupils 3/reactive, cont scleral edema Neuro:  sedated- sedation paused, was able to wiggle toes/ thumbs up CV:  ST, no murmur PULM:  non labored on MV, clear, small amount of thick yellow secretions, no wheezing GI:  less soft but +bs and +BM yesterday, foley  Extremities: warm/dry, 2+ pulse bilaterally, generalized 1-2+ edema    UOP 2.5L ml/ 24hrs -246m/ net  Wts 45.4> 53.5> 55.2> 56> 56  Labs reviewed: pending BMET, phos 2.3, mag 2.3, hgb stable, WBC 19.1> 15.7> 14.8   Resolved Hospital Problem list   New acute metabolic acidosis 2/2 sepsis +/- ETOH w/d  Assessment & Plan:  Principal Problem:   CAP (community acquired pneumonia) Active Problems:   [place]   Weakness   Acute  respiratory failure with hypoxia (HCC)   Elevated LFTs   Tobacco abuse   Risk for falls   Protein-calorie malnutrition, severe   Takotsubo cardiomyopathy   Malnutrition of moderate degree  ARDS secondary to Legionella PNA Long term tobacco use -2/16 proned >2/17 P: - Continue MV support, 8cc/kg IBW with goal Pplat <30 and DP<15  - O2/ peep needs continue to slowly improve - VAP prevention protocol/ PPI - PAD protocol for sedation> did not respond to precedex.  Cont fentanyl/ propofol.  Enteral  sedation started 2/18 w/ scheduled bowel regimen.  Increasing klonopin and changing to perocet TID for multimodal pain control in hopes to greatly reduce IV sedation - CO2 35 today and pCO2 52.6 8/88 likely metabolic alkalosis from diarrheics  - Hold weaning FiO2 until metabolic pCO2 declines  - stop lasix and start diamox to treat metabolic alkalosis  - daily SBT trials - CXR tomorrow - follow 2/15 BAL > rare candida albicans - continue levaquin for 10 days of therapy, stop date 2/26 - duonebs TID and prn   Septic shock secondary to PNA c/b new New stress induced CM (EF 30-35%)  Legionella pneumonia P: - decreasing vasopressor requirements despite diuresis yesterday, cont NE for MAP goal > 65 - trending CVP - coox has remained high, reassuring  - levaquin started 2/17, follow culture data and BAL  Likely right pleural effusion, parapneumonic - assessed on bedside US 2/18> consolidated lung, minimal effusion, not good window to safely consider thora.  Continue diuresis for now.      Likely COPD underlying - duonebs as above - will need f/u in pulmonary office for PFTs and follow-up  New systolic congestive heart failure Sinus tachycardia versus atypical atrial flutter - Echo with EF of 30 to 35% with apical hypokinesis, pattern could represent atypical stress-induced cardiomyopathy vs pre-existing, RV normal - Suspect new decreased EF secondary to critical illness vs pre-existing CM (? ETOH related) vs ischemic CM - did not tolerate dobutamine P: - HF following, appreciate recs  - will need ischemic eval prior to discharge - GDMT once off pressors - Holding lasix given diarrhetic induced metabolic alkalosis  - strict I/Os, daily wts, UOP   Fluid & Electrolyte imbalance: Hyponatremia, hypokalemia, hypophosphatemia, hypocalcemia  P: - continue to aggressive replete electrolytes> calcium gluconate, kphos, mag - goal K>4, Mag> 2 - trend BMET/ mag/ phos  Elevated  LFT's Alcohol Use -Acute hepatitis panel neg; slowly improving  P: - LFTs downtrending - Avoid hepatotoxins - continue thiamine, folate, and multivitamin - Cessation education when appropriate  Fall risk P: When appropriate PT/OT  Hyperglycemia - cont SSI sensitive, semglee 5 units daily  Best Practice (right click and "Reselect all SmartList Selections" daily)   Diet/type: tubefeeds DVT prophylaxis: LMWH GI prophylaxis: PPI Lines: Central line and Arterial Line (R IJ and left radial) Foley:  Yes, and it is still needed Code Status:  full code Last date of multidisciplinary goals of care discussion [Discussed intubation with patient and SO, would want full code]  Husband updated at bedside 2/20.   CRITICAL CARE Performed by: Bryan Lemma  Total critical care time: 35 minutes  Critical care time was exclusive of separately billable procedures and treating other patients.  Critical care was necessary to treat or prevent imminent or life-threatening deterioration.  Critical care was time spent personally by me on the following activities: development of treatment plan with patient and/or surrogate as well as nursing, discussions with consultants, evaluation of patient's response  to treatment, examination of patient, obtaining history from patient or surrogate, ordering and performing treatments and interventions, ordering and review of laboratory studies, ordering and review of radiographic studies, pulse oximetry and re-evaluation of patient's condition.

## 2021-12-05 NOTE — Progress Notes (Signed)
Physical Therapy Treatment Patient Details Name: Gina Cherry MRN: [id number] DOB: 12-30-1955 Today's Date: 12/05/2021   History of Present Illness Pt is 66 yo female who presents with 5 day h/o cough, SOB, diarrhea, and weakness that resulted in a fall with head lacerations, CT (-) for ICH. Pt found to have sepsis due to RLL PNA. Pt with worsening respiratory status on 2/15 and VDRF.  PMH: smoker, EtOH abuse    PT Comments    Pt admitted with above diagnosis. Pt was able to exercise in bed following commands to move all 4 extremities.  Nurse did not want pt OOB or EOB today.  Caregiver was educated in assisting with exercises in bed.  Pt currently with functional limitations due to balance and endurance deficits. Pt will benefit from skilled PT to increase their independence and safety with mobility to allow discharge to the venue listed below.      Recommendations for follow up therapy are one component of a multi-disciplinary discharge planning process, led by the attending physician.  Recommendations may be updated based on patient status, additional functional criteria and insurance authorization.  Follow Up Recommendations  Acute inpatient rehab (3hours/day)     Assistance Recommended at Discharge Frequent or constant Supervision/Assistance  Patient can return home with the following Two people to help with walking and/or transfers;Two people to help with bathing/dressing/bathroom;Assistance with cooking/housework;Direct supervision/assist for medications management;Direct supervision/assist for financial management;Assist for transportation;Help with stairs or ramp for entrance   Equipment Recommendations  Rolling walker (2 wheels)    Recommendations for Other Services Rehab consult     Precautions / Restrictions Precautions Precautions: Fall Restrictions Weight Bearing Restrictions: No Other Position/Activity Restrictions: watch sats and HR     Mobility  Bed  Mobility Overal bed mobility: Needs Assistance Bed Mobility: Rolling Rolling: Mod assist, Max assist, +2 for physical assistance         General bed mobility comments: Pt was able to roll for positioning with assist. Pt following commands for all exerrcises however nurse did not want PT to get pt to EOB today.    Transfers                   General transfer comment: Unable per nurse as pt fatigued and intubated yesterday.    Ambulation/Gait               General Gait Details: not tolerated today   Stairs             Wheelchair Mobility    Modified Rankin (Stroke Patients Only)       Balance                                            Cognition Arousal/Alertness: Awake/alert Behavior During Therapy: WFL for tasks assessed/performed, Anxious Overall Cognitive Status: Difficult to assess                                          Exercises General Exercises - Upper Extremity Shoulder Flexion: AAROM, Both, 10 reps, Supine Elbow Flexion: AAROM, Both, Supine, 10 reps Elbow Extension: AAROM, Both, 10 reps, Supine Digit Composite Flexion: AAROM, Both, 5 reps, Supine General Exercises - Lower Extremity Ankle Circles/Pumps: AROM, Both, 10 reps, Supine Quad Sets: AROM, Both,  10 reps, Supine Gluteal Sets: AROM, Both, 10 reps, Supine Heel Slides: AROM, Both, 10 reps, Supine Other Exercises Other Exercises: Caregiver educated in assist with elbow and hand ROM as well as LE exercises.    General Comments General comments (skin integrity, edema, etc.): RR 17-38 with activity, 104 bpm, 90% O2, 124/64      Pertinent Vitals/Pain Pain Assessment Pain Assessment: No/denies pain    Home Living                          Prior Function            PT Goals (current goals can now be found in the care plan section) Acute Rehab PT Goals Patient Stated Goal: return home Progress towards PT goals: Progressing  toward goals    Frequency    Min 3X/week      PT Plan Current plan remains appropriate    Co-evaluation              AM-PAC PT "6 Clicks" Mobility   Outcome Measure  Help needed turning from your back to your side while in a flat bed without using bedrails?: A Lot Help needed moving from lying on your back to sitting on the side of a flat bed without using bedrails?: A Lot Help needed moving to and from a bed to a chair (including a wheelchair)?: Total Help needed standing up from a chair using your arms (e.g., wheelchair or bedside chair)?: Total Help needed to walk in hospital room?: Total Help needed climbing 3-5 steps with a railing? : Total 6 Click Score: 8    End of Session Equipment Utilized During Treatment: Other (comment) (Vent 50% FiO2, 8 PEEP) Activity Tolerance: Patient limited by fatigue Patient left: with call bell/phone within reach;in bed;with bed alarm set;with family/visitor present Nurse Communication: Mobility status;Need for lift equipment PT Visit Diagnosis: Muscle weakness (generalized) (M62.81);Difficulty in walking, not elsewhere classified (R26.2)     Time: 1203-1226 PT Time Calculation (min) (ACUTE ONLY): 23 min  Charges:  $Therapeutic Exercise: 23-37 mins                     Gina Cherry M,PT Acute Rehab Services [phone] (pager)    Gina Cherry 12/05/2021, 2:54 PM

## 2021-12-05 NOTE — Progress Notes (Addendum)
Advanced Heart Failure Rounding Note  PCP-Cardiologist: None   Subjective:    Remains intubated. FiO2 50%, PEEP 8.   NE weaned off yesterday evening. MAPs 70s-80s.  Last co-ox 88% on 02/18  CVP 9. Received 40 mg lasix IV 02/18 and 02/19. 2.9L UOP charted yesterday, nearly net event d/t gtts.  CCM stopped lasix and starting diamox d/t metabolic alkalosis  On Levaquin for legionella PNA. T 100.84F this am.   Sinus tach this am, rates up to 120s with decreased sedation  Objective:   Weight Range: 56 kg Body mass index is 21.19 kg/m.   Vital Signs:   Temp:  [96.9 F (36.1 C)-100.5 F (38.1 C)] 100.5 F (38.1 C) (02/20 0740) Pulse Rate:  [95-121] 120 (02/20 0800) Resp:  [9-24] 24 (02/20 0800) BP: (90-150)/(48-73) 143/62 (02/20 0800) SpO2:  [87 %-97 %] 93 % (02/20 0800) FiO2 (%):  [50 %] 50 % (02/20 0732) Weight:  [56 kg] 56 kg (02/20 0500) Last BM Date : 12/03/21  Weight change: Filed Weights   12/03/21 0400 12/04/21 0500 12/05/21 0500  Weight: 55.2 kg 56 kg 56 kg    Intake/Output:   Intake/Output Summary (Last 24 hours) at 12/05/2021 0828 Last data filed at 12/05/2021 0800 Gross per 24 hour  Intake 2645.76 ml  Output 3030 ml  Net -384.24 ml      Physical Exam    CVP 9 General:  Intubated. Appears older than stated age, critically ill. HEENT: + ETT Neck: supple. no JVD. Carotids 2+ bilat; no bruits.  Cor: PMI nondisplaced. Regular rate & rhythm, tachy. No rubs, gallops or murmurs. Lungs: bibasilar crackles Abdomen: soft, nontender, nondistended. Good bowel sounds. Extremities: no cyanosis, clubbing, rash, 1+ edema Neuro: following commands with decreased sedation     Telemetry   Sinus tach 90s-110s, up to 120s last few hrs  EKG    No EKG to review   Labs    CBC Recent Labs    12/04/21 0402 12/04/21 0440 12/05/21 0500  WBC 15.7*  --  14.8*  HGB 9.2* 8.8* 9.6*  HCT 26.1* 26.0* 28.4*  MCV 104.4*  --  106.8*  PLT 273  --  283    Basic Metabolic Panel Recent Labs    02/05/39 0402 12/04/21 0440 12/04/21 1031 12/05/21 0500  NA  --    < > 132* 134*  K  --    < > 4.3 4.1  CL  --   --  94* 90*  CO2  --   --  30 35*  GLUCOSE  --   --  124* 126*  BUN  --   --  9 11  CREATININE  --   --  0.41* 0.42*  CALCIUM  --   --  7.0* 7.5*  MG 1.8  --   --  2.3  PHOS 2.0*  --   --  2.3*   < > = values in this interval not displayed.   Liver Function Tests Recent Labs    12/05/21 0500  ALBUMIN <1.5*   No results for input(s): LIPASE, AMYLASE in the last 72 hours. Cardiac Enzymes No results for input(s): CKTOTAL, CKMB, CKMBINDEX, TROPONINI in the last 72 hours.   BNP: BNP (last 3 results) Recent Labs    11/30/21 0828  BNP 1,181.2*    ProBNP (last 3 results) No results for input(s): PROBNP in the last 8760 hours.   D-Dimer No results for input(s): DDIMER in the last 72 hours. Hemoglobin A1C  No results for input(s): HGBA1C in the last 72 hours.  Fasting Lipid Panel Recent Labs    12/05/21 0500  TRIG 205*   Thyroid Function Tests No results for input(s): TSH, T4TOTAL, T3FREE, THYROIDAB in the last 72 hours.  Invalid input(s): FREET3  Other results:   Imaging    No results found.   Medications:     Scheduled Medications:  aspirin  81 mg Per Tube Daily   chlorhexidine gluconate (MEDLINE KIT)  15 mL Mouth Rinse BID   Chlorhexidine Gluconate Cloth  6 each Topical Q0600   clonazepam  1 mg Per Tube BID   docusate  100 mg Per Tube BID   folic acid  1 mg Per Tube Daily   insulin aspart  0-9 Units Subcutaneous Q4H   insulin glargine-yfgn  5 Units Subcutaneous Daily   ipratropium-albuterol  3 mL Nebulization TID   mouth rinse  15 mL Mouth Rinse 10 times per day   multivitamin with minerals  1 tablet Per Tube Daily   nicotine  21 mg Transdermal Daily   oxyCODONE-acetaminophen  2 tablet Per Tube TID   pantoprazole sodium  40 mg Per Tube Daily   polyethylene glycol  17 g Per Tube Daily    thiamine  100 mg Per Tube Daily    Infusions:  feeding supplement (VITAL AF 1.2 CAL) 50 mL/hr at 12/04/21 2000   fentaNYL infusion INTRAVENOUS 175 mcg/hr (12/05/21 0800)   levofloxacin (LEVAQUIN) IV Stopped (12/04/21 1109)   norepinephrine (LEVOPHED) Adult infusion Stopped (12/04/21 1728)   propofol (DIPRIVAN) infusion Stopped (12/05/21 0731)    PRN Medications: fentaNYL, ipratropium-albuterol, midazolam    Patient Profile   66 y/o female w/ h/o tobacco and ETOH abuse (drinks ~4 glasses of wine/day), no prior cardiac history. Admitted w/ sepsis PNA and found to have reduced LV dysfunction on echo. EF 40-45% w/ WMA pattern c/w possible atypical stressed induced CM.    Assessment/Plan   1. Shock  - lactic acid 3.1>>1.7>>2.1>2.5>1.8 - primarily septic shock in the setting of b/l PNA  - NE now off - c/w abx per PCCM    2. Legionella PNA - abx per PCCM - Blood cultures NGTD - Resp culture - no organisms, rare candida   3. Acute Systolic Heart Failure  - Echo EF read 30-35% (feel more like 40-45%), RV normal (no prior study for comparison)  - Suspect likely new given WMA pattern c/w possible stress induced CM in the setting of acute sepsis PNA but cannot r/o possibility of preexisting CM, particularly ETOH induced CM vs Ischemic CM  - continue tx of sepsis/ PNA - Now off NE. plan gradual GDMT titration once stable from infectious standpoint and as BP permits  - Last co-ox 88% on 02/18 - CVP 9. Diuresed with IV lasix last couple of days. Switching to diamox per CCM for metabolic alkalosis - repeat echo today. If EF not improved, will need additional w/u to r/o other etiologies w/ [facility] +/- cMRI  - once discharged, ETOH reduction will be imperative  - Central line coming out today  4. Sinus tachycardia - More pronounced this am, rates up to 120s - Likely d/t acute illness - CCM starting metoprolol tartrate 25 mg BID.    5. Acute Hypoxic Respiratory Failure - b/l PNA  -  intubated  - management per PCCM    6. Elevated LFTs - Suspect 2/2 shock + ETOH related  - LFTs trending down, recheck today   7. Hypokalemia/ Hypomagnesemia  -  K 4.1 , Mg 2.3 - supp as needed with diuresis  8. Tobacco/ ETOH Abuse - cessation advised   7. Elevated HS trop  - Hs trop [phone] - cannot r/o out the possibility of out of hospital MI prior to arrival  - will need LHC to r/o ischemic heart disease once improved from septic standpoint   - Continue 81 mg aspirin   Length of Stay: 6  FINCH, LINDSAY N, PA-C  12/05/2021, 8:28 AM  Advanced Heart Failure Team Pager [phone] (M-F; 7a - 5p)  Please contact [facility] for night-coverage after hours (5p -7a ) and weekends on [email or web address]  Agree with above  Remains intubated. Now awake on vent. On PS with volume 475-550cc. Off NE. Co-ox stable. Weight still up.  Echo today EF 65-70% no RWMA  General:  Awake on vent. Ill-appearing. No resp difficulty HEENT: normal + ETT Neck: supple. JVP to jaw  Carotids 2+ bilat; no bruits. No lymphadenopathy or thryomegaly appreciated. Cor: PMI nondisplaced. Regular tachy  Lungs: + rhonchi Abdomen: soft, nontender, nondistended. No hepatosplenomegaly. No bruits or masses. Good bowel sounds. Extremities: no cyanosis, clubbing, rash, 1-2+ edema Neuro: alert & orientedx3, cranial nerves grossly intact. moves all 4 extremities w/o difficulty. Affect pleasant  Remains on vent. PNA improving. Echo today with normalization of EF. Suspect septic CM but with elevated troponin may also have demand ischemia.   Continue to wean vent. Diurese as tolerated (weight still up).  Will discuss role of possible diagnostic cath once she recovers.   D/w Dr. Lynetta Mare.   CRITICAL CARE Performed by: Glori Bickers  Total critical care time: 35 minutes  Critical care time was exclusive of separately billable procedures and treating other patients.  Critical care was necessary to treat or  prevent imminent or life-threatening deterioration.  Critical care was time spent personally by me (independent of midlevel providers or residents) on the following activities: development of treatment plan with patient and/or surrogate as well as nursing, discussions with consultants, evaluation of patient's response to treatment, examination of patient, obtaining history from patient or surrogate, ordering and performing treatments and interventions, ordering and review of laboratory studies, ordering and review of radiographic studies, pulse oximetry and re-evaluation of patient's condition.  Glori Bickers, MD  2:22 PM

## 2021-12-06 ENCOUNTER — Inpatient Hospital Stay (HOSPITAL_COMMUNITY): Payer: Medicare HMO

## 2021-12-06 DIAGNOSIS — I5181 Takotsubo syndrome: Secondary | ICD-10-CM | POA: Diagnosis not present

## 2021-12-06 DIAGNOSIS — A419 Sepsis, unspecified organism: Secondary | ICD-10-CM | POA: Diagnosis not present

## 2021-12-06 DIAGNOSIS — R6521 Severe sepsis with septic shock: Secondary | ICD-10-CM | POA: Diagnosis not present

## 2021-12-06 DIAGNOSIS — J9601 Acute respiratory failure with hypoxia: Secondary | ICD-10-CM | POA: Diagnosis not present

## 2021-12-06 LAB — GLUCOSE, CAPILLARY
Glucose-Capillary: 119 mg/dL — ABNORMAL HIGH (ref 70–99)
Glucose-Capillary: 145 mg/dL — ABNORMAL HIGH (ref 70–99)
Glucose-Capillary: 150 mg/dL — ABNORMAL HIGH (ref 70–99)
Glucose-Capillary: 140 mg/dL — ABNORMAL HIGH (ref 70–99)
Glucose-Capillary: 149 mg/dL — ABNORMAL HIGH (ref 70–99)
Glucose-Capillary: 121 mg/dL — ABNORMAL HIGH (ref 70–99)

## 2021-12-06 LAB — COMPREHENSIVE METABOLIC PANEL
ALT: 90 U/L — ABNORMAL HIGH (ref 0–44)
AST: 193 U/L — ABNORMAL HIGH (ref 15–41)
Albumin: 1.6 g/dL — ABNORMAL LOW (ref 3.5–5.0)
Alkaline Phosphatase: 183 U/L — ABNORMAL HIGH (ref 38–126)
Anion gap: 8 (ref 5–15)
BUN: 12 mg/dL (ref 8–23)
CO2: 32 mmol/L (ref 22–32)
Calcium: 7.9 mg/dL — ABNORMAL LOW (ref 8.9–10.3)
Chloride: 95 mmol/L — ABNORMAL LOW (ref 98–111)
Creatinine, Ser: 0.4 mg/dL — ABNORMAL LOW (ref 0.44–1.00)
GFR, Estimated: >60 mL/min (ref >60)
Glucose, Bld: 133 mg/dL — ABNORMAL HIGH (ref 70–99)
Potassium: 3.2 mmol/L — ABNORMAL LOW (ref 3.5–5.1)
Sodium: 135 mmol/L (ref 135–145)
Total Bilirubin: 0.4 mg/dL (ref 0.3–1.2)
Total Protein: 5.2 g/dL — ABNORMAL LOW (ref 6.5–8.1)

## 2021-12-06 LAB — CBC WITH DIFFERENTIAL/PLATELET
Abs Immature Granulocytes: 0.2 10*3/uL — ABNORMAL HIGH (ref 0.00–0.07)
Basophils Absolute: 0 10*3/uL (ref 0.0–0.1)
Basophils Relative: 0 %
Eosinophils Absolute: 0.2 10*3/uL (ref 0.0–0.5)
Eosinophils Relative: 1 %
HCT: 30.3 % — ABNORMAL LOW (ref 36.0–46.0)
Hemoglobin: 10.2 g/dL — ABNORMAL LOW (ref 12.0–15.0)
Immature Granulocytes: 1 %
Lymphocytes Relative: 7 %
Lymphs Abs: 1 10*3/uL (ref 0.7–4.0)
MCH: 35.7 pg — ABNORMAL HIGH (ref 26.0–34.0)
MCHC: 33.7 g/dL (ref 30.0–36.0)
MCV: 105.9 fL — ABNORMAL HIGH (ref 80.0–100.0)
Monocytes Absolute: 1.2 10*3/uL — ABNORMAL HIGH (ref 0.1–1.0)
Monocytes Relative: 8 %
Neutro Abs: 12.5 10*3/uL — ABNORMAL HIGH (ref 1.7–7.7)
Neutrophils Relative %: 83 %
Platelets: 328 10*3/uL (ref 150–400)
RBC: 2.86 MIL/uL — ABNORMAL LOW (ref 3.87–5.11)
RDW: 14.5 % (ref 11.5–15.5)
WBC: 15.1 10*3/uL — ABNORMAL HIGH (ref 4.0–10.5)
nRBC: 0.1 % (ref 0.0–0.2)

## 2021-12-06 LAB — PHOSPHORUS: Phosphorus: 4 mg/dL (ref 2.5–4.6)

## 2021-12-06 MED ORDER — NOREPINEPHRINE 4 MG/250ML-% IV SOLN
0.0000 ug/min | INTRAVENOUS | Status: DC
  Filled 2021-12-06: qty 250

## 2021-12-06 MED ORDER — FUROSEMIDE 10 MG/ML IJ SOLN
40.0000 mg | Freq: Once | INTRAMUSCULAR | Status: AC
  Administered 2021-12-06: 40 mg via INTRAVENOUS
  Filled 2021-12-06: qty 4

## 2021-12-06 MED ORDER — POTASSIUM CHLORIDE 20 MEQ PO PACK
40.0000 meq | PACK | Freq: Once | ORAL | Status: AC
  Administered 2021-12-06: 40 meq
  Filled 2021-12-06: qty 2

## 2021-12-06 MED ORDER — POTASSIUM CHLORIDE CRYS ER 20 MEQ PO TBCR
40.0000 meq | EXTENDED_RELEASE_TABLET | Freq: Once | ORAL | Status: DC
Start: 2021-12-06 — End: 2021-12-06

## 2021-12-06 MED ORDER — OSMOLITE 1.5 CAL PO LIQD
1000.0000 mL | ORAL | Status: DC
  Administered 2021-12-06 – 2021-12-10 (×5): 1000 mL

## 2021-12-06 NOTE — Progress Notes (Signed)
Inpatient Rehab Admissions Coordinator:   Per therapy recommendations,  patient was screened for CIR candidacy by Megan Salon, MS, CCC-SLP  At this time, Pt. Continues to require vent support and is not medically ready for CIR. I will not pursue a rehab consult for this Pt. at this time, but CIR admissions team will follow and monitor for medical readiness and place consult order if Pt. appears to be an appropriate candidate. Please contact me with any questions.    Megan Salon, MS, CCC-SLP Rehab Admissions Coordinator  [phone] (celll) [phone] (office)

## 2021-12-06 NOTE — Progress Notes (Addendum)
° ° Advanced Heart Failure Rounding Note ° °PCP-Cardiologist: None  ° °Subjective:   ° °Remains on Levaquin for legionella PNA. Improving. Afebrile overnight.  Intubated, FiO2 40%. Awake on vent and following commands.  ° °BP stable off NE  ° °Still fluid overloaded from volume resuscitation. Getting IV Lasix. No CVP set up (no central access)  ° °12/20: Repeat Echo  EF 65-70% no RWMA ° ° ° °Objective:   °Weight Range: °54.2 kg °Body mass index is 20.51 kg/m².  ° °Vital Signs:   °Temp:  [97.6 °F (36.4 °C)-100.4 °F (38 °C)] 97.9 °F (36.6 °C) (02/21 0800) °Pulse Rate:  [80-129] 105 (02/21 0942) °Resp:  [14-28] 19 (02/21 0700) °BP: (83-155)/(40-91) 121/68 (02/21 0942) °SpO2:  [89 %-96 %] 93 % (02/21 0843) °FiO2 (%):  [50 %-60 %] 50 % (02/21 0843) °Weight:  [54.2 kg] 54.2 kg (02/21 0500) °Last BM Date : 12/03/21 ° °Weight change: °Filed Weights  ° 12/04/21 0500 12/05/21 0500 12/06/21 0500  °Weight: 56 kg 56 kg 54.2 kg  ° ° °Intake/Output:  ° °Intake/Output Summary (Last 24 hours) at 12/06/2021 0945 °Last data filed at 12/06/2021 0800 °Gross per 24 hour  °Intake 2010.47 ml  °Output 1250 ml  °Net 760.47 ml  °  ° ° °Physical Exam  °  °General:  fatigued appearing, intubated, awake on vent. No respiratory difficulty °HEENT: normal + ETT  °Neck: supple. JVD 10 cm. Carotids 2+ bilat; no bruits. No lymphadenopathy or thyromegaly appreciated. °Cor: PMI nondisplaced. Regular rhythm, tachy rate. No rubs, gallops or murmurs. °Lungs: intubated clear + b/l rhonchi  °Abdomen: soft, nontender, nondistended. No hepatosplenomegaly. No bruits or masses. Good bowel sounds. °Extremities: no cyanosis, clubbing, rash, 1+ upper and LE edema °Neuro: intubated, awake on vent, following commands. ° ° °Telemetry  ° °Sinus tach 110s-120s, personally reviewed  ° °EKG  °  °No EKG to review  ° °Labs  °  °CBC °Recent Labs  °  12/05/21 °0500 12/06/21 °0430  °WBC 14.8* 15.1*  °NEUTROABS  --  12.5*  °HGB 9.6* 10.2*  °HCT 28.4* 30.3*  °MCV 106.8* 105.9*   °PLT 283 328  ° °Basic Metabolic Panel °Recent Labs  °  12/04/21 °0402 12/04/21 °0440 12/05/21 °0500 12/06/21 °0430  °NA  --    < > 134* 135  °K  --    < > 4.1 3.2*  °CL  --    < > 90* 95*  °CO2  --    < > 35* 32  °GLUCOSE  --    < > 126* 133*  °BUN  --    < > 11 12  °CREATININE  --    < > 0.42* 0.40*  °CALCIUM  --    < > 7.5* 7.9*  °MG 1.8  --  2.3  --   °PHOS 2.0*  --  2.3* 4.0  ° < > = values in this interval not displayed.  ° °Liver Function Tests °Recent Labs  °  12/05/21 °0500 12/06/21 °0430  °AST  --  193*  °ALT  --  90*  °ALKPHOS  --  183*  °BILITOT  --  0.4  °PROT  --  5.2*  °ALBUMIN <1.5* 1.6*  ° °No results for input(s): LIPASE, AMYLASE in the last 72 hours. °Cardiac Enzymes °No results for input(s): CKTOTAL, CKMB, CKMBINDEX, TROPONINI in the last 72 hours. ° ° °BNP: °BNP (last 3 results) °Recent Labs  °  11/30/21 °0828  °BNP 1,181.2*  ° ° °ProBNP (last 3   last 3 results) No results for input(s): PROBNP in the last 8760 hours.   D-Dimer No results for input(s): DDIMER in the last 72 hours. Hemoglobin A1C No results for input(s): HGBA1C in the last 72 hours.  Fasting Lipid Panel Recent Labs    12/05/21 0500  TRIG 205*   Thyroid Function Tests No results for input(s): TSH, T4TOTAL, T3FREE, THYROIDAB in the last 72 hours.  Invalid input(s): FREET3  Other results:   Imaging    DG CHEST PORT 1 VIEW  Result Date: 12/06/2021 CLINICAL DATA:  Respiratory failure with hypoxia. EXAM: PORTABLE CHEST 1 VIEW COMPARISON:  12/03/2021; 12/01/2021 FINDINGS: Grossly unchanged cardiac silhouette and mediastinal contours with atherosclerotic plaque within thoracic aorta. Interval right jugular approach central venous catheter. Otherwise, stable positioning remaining support apparatus. No pneumothorax. The pulmonary vasculature remains indistinct with cephalization flow. Unchanged small to moderate-sized right-sided pleural effusion with associated bibasilar heterogeneous/consolidative opacities, right  greater than left. No new focal airspace opacities. No acute osseous abnormalities. IMPRESSION: 1. Interval removal of right jugular approach central venous catheter. Otherwise, stable positioning of remaining support apparatus. No pneumothorax. 2. Otherwise similar findings of pulmonary edema, small to moderate-sized right-sided effusion and associated bibasilar heterogeneous/consolidative opacities, right greater than left, atelectasis versus infiltrate. Electronically Signed   By: Sandi Mariscal M.D.   On: 12/06/2021 08:11   ECHOCARDIOGRAM LIMITED  Result Date: 12/05/2021    ECHOCARDIOGRAM LIMITED REPORT   Patient Name:   Gina Cherry [facility] Date of Exam: 12/05/2021 Medical Rec #:  [id number]           Height:       64.0 in Accession #:    [id number]          Weight:       123.5 lb Date of Birth:  [birth year]           BSA:          1.594 m Patient Age:    66 years            BP:           124/64 mmHg Patient Gender: F                   HR:           109 bpm. Exam Location:  Inpatient Procedure: Limited Echo, Color Doppler and 3D Echo Indications:    CHF-Acute Systolic D53.29  History:        Patient has prior history of Echocardiogram examinations, most                 recent 11/29/2021. Arrythmias:Sinus tachycardia. ETOH. Acute                 Hypoxic Respiratory Failure.  Sonographer:    Darlina Sicilian RDCS Referring Phys: [phone] [facility]  Sonographer Comments: Echo performed with patient supine and on artificial respirator. IMPRESSIONS  1. Left ventricular ejection fraction, by estimation, is 70 to 75%. The left ventricle has hyperdynamic function. The left ventricle has no regional wall motion abnormalities. There is moderate left ventricular hypertrophy.  2. Right ventricular systolic function is moderately reduced. The right ventricular size is mildly enlarged. Moderately increased right ventricular wall thickness. There is mildly elevated pulmonary artery systolic pressure.  3. The mitral valve is  normal in structure. No evidence of mitral valve regurgitation.  4. The aortic valve is normal in structure. Aortic valve regurgitation is not visualized. No aortic stenosis is present. FINDINGS  Left Ventricle: Left ventricular ejection fraction, by estimation, is 70 to 75%. The left ventricle has hyperdynamic function. The left ventricle has no regional wall motion abnormalities. The left ventricular internal cavity size was small. There is moderate left ventricular hypertrophy. Right Ventricle: The right ventricular size is mildly enlarged. Moderately increased right ventricular wall thickness. Right ventricular systolic function is moderately reduced. There is mildly elevated pulmonary artery systolic pressure. The tricuspid regurgitant velocity is 2.70 m/s, and with an assumed right atrial pressure of 8 mmHg, the estimated right ventricular systolic pressure is 24.2 mmHg. Pericardium: Trivial pericardial effusion is present. Mitral Valve: The mitral valve is normal in structure. Tricuspid Valve: Tricuspid valve regurgitation is mild. Aortic Valve: The aortic valve is normal in structure. Aortic valve regurgitation is not visualized. No aortic stenosis is present. TRICUSPID VALVE TR Peak grad:   29.2 mmHg TR Vmax:        270.00 cm/s Mertie Moores MD Electronically signed by Mertie Moores MD Signature Date/Time: 12/05/2021/3:05:34 PM    Final      Medications:     Scheduled Medications:  aspirin  81 mg Per Tube Daily   chlorhexidine gluconate (MEDLINE KIT)  15 mL Mouth Rinse BID   Chlorhexidine Gluconate Cloth  6 each Topical Q0600   clonazepam  1 mg Per Tube BID   docusate  100 mg Per Tube BID   folic acid  1 mg Per Tube Daily   insulin aspart  0-9 Units Subcutaneous Q4H   insulin glargine-yfgn  5 Units Subcutaneous Daily   ipratropium-albuterol  3 mL Nebulization TID   mouth rinse  15 mL Mouth Rinse 10 times per day   metoprolol tartrate  25 mg Per Tube BID   multivitamin with minerals  1  tablet Per Tube Daily   nicotine  21 mg Transdermal Daily   oxyCODONE-acetaminophen  2 tablet Per Tube TID   pantoprazole sodium  40 mg Per Tube Daily   polyethylene glycol  17 g Per Tube Daily   thiamine  100 mg Per Tube Daily    Infusions:  feeding supplement (VITAL AF 1.2 CAL) 50 mL/hr at 12/06/21 0400   fentaNYL infusion INTRAVENOUS 200 mcg/hr (12/06/21 0800)   levofloxacin (LEVAQUIN) IV 750 mg (12/06/21 0935)   norepinephrine (LEVOPHED) Adult infusion Stopped (12/06/21 0147)   propofol (DIPRIVAN) infusion Stopped (12/06/21 0725)    PRN Medications: fentaNYL, ipratropium-albuterol, midazolam    Patient Profile   66 y/o female w/ h/o tobacco and ETOH abuse (drinks ~4 glasses of wine/day), no prior cardiac history. Admitted w/ sepsis PNA and found to have reduced LV dysfunction on echo. EF 40-45% w/ WMA pattern c/w possible atypical stressed induced CM.    Assessment/Plan   1. Shock  - lactic acid 3.1>>1.7>>2.1>2.5>1.8 - primarily septic shock in the setting of b/l PNA  - NE now off - c/w abx per PCCM    2. Legionella PNA - abx per PCCM - Blood cultures NGTD - Resp culture - no organisms, rare candida   3. Acute Systolic Heart Failure  - Echo 2/14 EF read 30-35% (feel more like 40-45%), RV normal, WMA pattern c/w stress induced CM - Repeat Echo 2/20: EF 65-70% no RWMA supporting stress induced CM, now recovered  - BP stable off NE  - volume up post fluid resuscitation. Continue IV Lasix  4. Sinus tachycardia - More pronounced this am, rates up to 120s - Likely d/t acute illness - CCM starting metoprolol tartrate 25 mg BID.  5. Acute Hypoxic Respiratory Failure °- b/l PNA  °- intubated  °- management per PCCM  °  °6. Elevated LFTs °- Suspect 2/2 shock + ETOH related  °- LFTs trending down °  °7. Hypokalemia/ Hypomagnesemia  °- K 3.2 , Mg 2.3 yesterday  °- supp K today  °- follow with diuresis ° °8. Tobacco/ ETOH Abuse °- cessation advised  ° °7. Elevated HS trop   °- Hs trop 142>142>267 °- suspect demand ischemia  °- EF recovered  °- Continue 81 mg aspirin ° ° °Length of Stay: 7 ° °Brittainy Simmons, PA-C  °12/06/2021, 9:45 AM ° °Advanced Heart Failure Team °Pager [phone] (M-F; 7a - 5p)  °Please contact [facility] for night-coverage after hours (5p -7a ) and weekends on [email or web address] ° ° °Agree with above. ° °Awake on vent. Following commands. Continues to diurese. Now off pressors ° °General:  on vent awake °HEENT: normal + ETT °Neck: supple. nJVP to jaw . Carotids 2+ bilat; no bruits. No lymphadenopathy or thryomegaly appreciated. °Cor: PMI nondisplaced. Regular rate & rhythm. No rubs, gallops or murmurs. °Lungs: + crackles  °Abdomen: soft, nontender, nondistended. No hepatosplenomegaly. No bruits or masses. Good bowel sounds. °Extremities: no cyanosis, clubbing, rash, tr edema °Neuro: alert & orientedx3, cranial nerves grossly intact. moves all 4 extremities w/o difficulty. Affect pleasant ° °Remains on vent. Agree with continues diuresis. Suspect septic/stress CM but cannot exclude underlying ischemic disease. Will discuss role for cath after extubation.  ° °CRITICAL CARE °Performed by: ,  ° °Total critical care time: 35 minutes ° °Critical care time was exclusive of separately billable procedures and treating other patients. ° °Critical care was necessary to treat or prevent imminent or life-threatening deterioration. ° °Critical care was time spent personally by me (independent of midlevel providers or residents) on the following activities: development of treatment plan with patient and/or surrogate as well as nursing, discussions with consultants, evaluation of patient's response to treatment, examination of patient, obtaining history from patient or surrogate, ordering and performing treatments and interventions, ordering and review of laboratory studies, ordering and review of radiographic studies, pulse oximetry and re-evaluation of patient's  condition. ° ° , MD  °5:36 PM ° ° ° ° ° °

## 2021-12-06 NOTE — Progress Notes (Signed)
Nutrition Follow-up  DOCUMENTATION CODES:   Underweight, Non-severe (moderate) malnutrition in context of social or environmental circumstances  INTERVENTION:   Tube feeding via OG tube: Osmolite 1.5 @ 45 ml/hr (1080 ml per day) ProSource TF BID  Provides 1700 kcal, 90 gm protein, 820 ml free water daily  Continue MVI with minerals    NUTRITION DIAGNOSIS:   Moderate Malnutrition (in the context of social/environmental circumstances) related to  (prolonged inadequate energy intake) as evidenced by mild fat depletion, moderate fat depletion, severe muscle depletion. Ongoing.   GOAL:   Patient will meet greater than or equal to 90% of their needs Progressing   MONITOR:   TF tolerance  REASON FOR ASSESSMENT:   Consult Enteral/tube feeding initiation and management  ASSESSMENT:   Pt with no known PMH except long history of tobacco use, currently smoking 1/2 pack per day, ETOH use (4 glasses of wine/day) admitted after fall at home with sepsis secondary to RLL PNA.   Pt discussed during ICU rounds and with RN.  Cardiology following, fluid overloaded getting lasix. Per cards ECHO 2/20 with normalization of EF.  Pt on Levaquin for legionella PNA. Pt awake and alert on vent  2/15 intubated 2/16 prone; starting TF   Patient is currently intubated on ventilator support MV: 14.4 L/min Temp (24hrs), Avg:98.5 F (36.9 C), Min:97.6 F (36.4 C), Max:100.4 F (38 C)  Medications reviewed and include: colace, folic acid, SSI, semglee, MVI with minerals, protonix, miralax, KCl, thiamine   Labs reviewed: K: 3.2 TG: 205 CBG's: 87-151  16 F OG tube: per xray tip below diaphragm  +15 L Moderate edema   Current TF:  Vital AF 1.2 at 50 ml/hr Provides 1440 kcal, 90 gm protein  Diet Order:   Diet Order             Diet NPO time specified  Diet effective now                   EDUCATION NEEDS:   Education needs have been addressed  Skin:  Skin Assessment:  Reviewed RN Assessment (Ecchymosis to the bilateral legs, laceration to the head)  Last BM:  2/18 - large type 7  Height:   Ht Readings from Last 1 Encounters:  11/30/21 5\' 4"  (1.626 m)    Weight:   Wt Readings from Last 1 Encounters:  12/06/21 54.2 kg    Ideal Body Weight:  54.5 kg  BMI:  Body mass index is 20.51 kg/m.  Estimated Nutritional Needs:   Kcal:  1500-1700 kcal/d  Protein:  75-85 g/d  Fluid:  1.5-1.7 L/d  12/08/21., RD, LDN, CNSC See AMiON for contact information

## 2021-12-06 NOTE — Progress Notes (Signed)
NAME:  Gina Cherry, MRN:  [id number], DOB:  [birth year], LOS: 7 ADMISSION DATE:  11/28/2021, CONSULTATION DATE:  12/06/21 REFERRING MD:  Lysbeth Galas, CHIEF COMPLAINT:   worsening hypoxia/dyspnea  History of Present Illness:  Gina Cherry is a 66 y.o. F with PMH of tobacco and ETOH use who presented with CAP ,bilateral pneumonia with WBC 13k and mildly elevated LFT' s along with hypokalemia.  She was admitted to the floor, intubated 2/15 for ARDS Course complicated by shock, stress cardiomyopathy  She smoking 1/2 ppd for 40+ years  Pertinent  Medical History  Tobacco and ETOH use   Significant Hospital Events: Including procedures, antibiotic start and stop dates in addition to other pertinent events   2/13 admitted to teaching service, later required HFNC and ICU trxfr, On Vanc/Ceftriaxone/Azithromycin 2/14 wbc ct up. Vancs stopped as MRSA PCR neg  ECHO Mderately reduced LV function with regional  wall motion abnormalities, 30-35%. There is hypokinesis of the basal to  mid segments of all walls of the LV. There is apical hyperkinesis. This pattern could represent an atypical  stress induced cardiomyopathy pattern as this spares the apex  (non-coronary distributions).  2/15 increased O2 needs. CXR worse. Mottled. Concern for shock. Vanc added back. Right IJ CVL placed. Right radial aline placed. Cards consulted. Intubated. BAL sent  2/16 remains intubated with high vent settings including FiO2 70% PEEP 10.  Lower extremities no longer mottled.  Proned for worsening ARDS 2/17 un-proned.  Started on levaquin, urine Legionella positive, down to 60%/ 10 peep, ongoing levophed 2/18 tmax 103, NE at 11 mcg, coox 88%, CVP 10, diuresed, down down 40%/ 8  Interim History / Subjective:  Remains on 60%/8 peep Tmax 97.9 On WUA- MAE and follows commands  SBP dropped to 80s last night and improved with decreased sedation  Objective   Blood pressure (!) 145/61, pulse (!) 112, temperature 97.9  F (36.6 C), temperature source Axillary, resp. rate 19, height _0  (1.626 m), weight 54.2 kg, SpO2 94 %. CVP:  [8 mmHg-14 mmHg] 14 mmHg  Vent Mode: PRVC FiO2 (%):  [50 %-60 %] 60 % Set Rate:  [22 bmp] 22 bmp Vt Set:  [430 mL] 430 mL PEEP:  [8 cmH20] 8 cmH20 Pressure Support:  [10 cmH20] 10 cmH20 Plateau Pressure:  [13 cmH20-19 cmH20] 19 cmH20   Intake/Output Summary (Last 24 hours) at 12/06/2021 0803 Last data filed at 12/06/2021 0700 Gross per 24 hour  Intake 1939.82 ml  Output 1375 ml  Net 564.82 ml    Filed Weights   12/04/21 0500 12/05/21 0500 12/06/21 0500  Weight: 56 kg 56 kg 54.2 kg   Physical Exam:  Fent 175 General:  critically ill elderly female in bed in NAD, sedated HEENT: MM pink/moist, ETT/ OGT, pupils 3/reactive, cont scleral edema Neuro:  sedated- sedation paused, was able to wiggle toes/ thumbs up/ raise both arms CV:  ST, no murmur PULM:  non labored on MV, bilateral rhonchi R>L , no wheezing GI:  less soft but +bs and +BM yesterday, foley  Extremities: warm/dry, 2+ pulse bilaterally, generalized 1-2+ edema, weeping right arm    UOP 1.5L ml/ 24hrs +505.4/ net  Wts 45.4> 53.5> 55.2> 56> 56>54.2  Labs reviewed: pending BMET, phos 2.3, mag 2.3, hgb stable, WBC 19.1> 15.7> 14.8>15.1   Resolved Hospital Problem list   New acute metabolic acidosis 2/2 sepsis +/- ETOH w/d  Assessment & Plan:  Principal Problem:   CAP (community acquired pneumonia) Active Problems:  Septic shock (HCC)   Weakness   Acute respiratory failure with hypoxia (HCC)   Elevated LFTs   Tobacco abuse   Risk for falls   Protein-calorie malnutrition, severe   Takotsubo cardiomyopathy   Malnutrition of moderate degree  ARDS secondary to Legionella PNA Long term tobacco use -2/16 proned >2/17 P: - Continue MV support, 8cc/kg IBW with goal Pplat <30 and DP<15  - O2/ peep needs continue to slowly improve - VAP prevention protocol/ PPI - PAD protocol for sedation> did not  respond to precedex.  Cont fentanyl/ propofol.  Enteral sedation started 2/18 w/ scheduled bowel regimen.  Increasing klonopin and changing to perocet TID for multimodal pain control in hopes to greatly reduce IV sedation - CO2 35 today and pCO2 52.6 7/00 likely metabolic alkalosis from diarrheics  - continue weaning FiO2 - Start lasix lasix  - daily SBT trials - follow 2/15 BAL > rare candida albicans - continue levaquin for 10 days of therapy, stop date 2/26 - duonebs TID and prn  - low sedation and PT   Septic shock secondary to PNA c/b new New stress induced CM (EF 30-35%)  Legionella pneumonia P: - decreasing vasopressor requirements despite diuresis yesterday, cont NE for MAP goal > 65 - trending CVP - coox has remained high, reassuring  - levaquin started 2/17, follow culture data and BAL  Likely right pleural effusion, parapneumonic - assessed on bedside US 2/18> consolidated lung, minimal effusion, not good window to safely consider thora.  Continue diuresis for now.      Likely COPD underlying - duonebs as above - will need f/u in pulmonary office for PFTs and follow-up  New systolic congestive heart failure Sinus tachycardia versus atypical atrial flutter - Echo with EF of 30 to 35% with apical hypokinesis, pattern could represent atypical stress-induced cardiomyopathy vs pre-existing, RV normal - Suspect new decreased EF secondary to critical illness vs pre-existing CM (? ETOH related) vs ischemic CM - did not tolerate dobutamine P: - HF following, appreciate recs  - will need ischemic eval prior to discharge - GDMT once off pressors - Holding lasix given diarrhetic induced metabolic alkalosis  - strict I/Os, daily wts, UOP   Fluid & Electrolyte imbalance: Hyponatremia, hypokalemia, hypophosphatemia, hypocalcemia  P: - continue to aggressive replete electrolytes> calcium gluconate, kphos, mag - goal K>4, Mag> 2 - trend BMET/ mag/ phos  Elevated  LFT's Alcohol Use -Acute hepatitis panel neg; slowly improving  P: - LFTs increased today - Avoid hepatotoxins - continue thiamine, folate, and multivitamin - Cessation education when appropriate  Fall risk P: When appropriate PT/OT  Hyperglycemia - cont SSI sensitive, semglee 5 units daily  Best Practice (right click and "Reselect all SmartList Selections" daily)   Diet/type: tubefeeds DVT prophylaxis: LMWH GI prophylaxis: PPI Lines: Central line and Arterial Line (R IJ and left radial) Foley:  Yes, and it is still needed Code Status:  full code Last date of multidisciplinary goals of care discussion [Discussed intubation with patient and SO, would want full code]  Husband updated at bedside 2/20.   CRITICAL CARE Performed by: Bryan Lemma  Total critical care time: 35 minutes  Critical care time was exclusive of separately billable procedures and treating other patients.  Critical care was necessary to treat or prevent imminent or life-threatening deterioration.  Critical care was time spent personally by me on the following activities: development of treatment plan with patient and/or surrogate as well as nursing, discussions with consultants, evaluation of  patient's response to treatment, examination of patient, obtaining history from patient or surrogate, ordering and performing treatments and interventions, ordering and review of laboratory studies, ordering and review of radiographic studies, pulse oximetry and re-evaluation of patient's condition.

## 2021-12-07 ENCOUNTER — Inpatient Hospital Stay (HOSPITAL_COMMUNITY): Payer: Medicare HMO

## 2021-12-07 DIAGNOSIS — J9601 Acute respiratory failure with hypoxia: Secondary | ICD-10-CM | POA: Diagnosis not present

## 2021-12-07 DIAGNOSIS — A419 Sepsis, unspecified organism: Secondary | ICD-10-CM | POA: Diagnosis not present

## 2021-12-07 DIAGNOSIS — R6521 Severe sepsis with septic shock: Secondary | ICD-10-CM | POA: Diagnosis not present

## 2021-12-07 DIAGNOSIS — I5181 Takotsubo syndrome: Secondary | ICD-10-CM | POA: Diagnosis not present

## 2021-12-07 LAB — CBC WITH DIFFERENTIAL/PLATELET
Abs Immature Granulocytes: 0.13 10*3/uL — ABNORMAL HIGH (ref 0.00–0.07)
Basophils Absolute: 0 10*3/uL (ref 0.0–0.1)
Basophils Relative: 0 %
Eosinophils Absolute: 0.1 10*3/uL (ref 0.0–0.5)
Eosinophils Relative: 1 %
HCT: 29.3 % — ABNORMAL LOW (ref 36.0–46.0)
Hemoglobin: 10.2 g/dL — ABNORMAL LOW (ref 12.0–15.0)
Immature Granulocytes: 1 %
Lymphocytes Relative: 8 %
Lymphs Abs: 1.2 10*3/uL (ref 0.7–4.0)
MCH: 36.7 pg — ABNORMAL HIGH (ref 26.0–34.0)
MCHC: 34.8 g/dL (ref 30.0–36.0)
MCV: 105.4 fL — ABNORMAL HIGH (ref 80.0–100.0)
Monocytes Absolute: 1.4 10*3/uL — ABNORMAL HIGH (ref 0.1–1.0)
Monocytes Relative: 9 %
Neutro Abs: 12.6 10*3/uL — ABNORMAL HIGH (ref 1.7–7.7)
Neutrophils Relative %: 81 %
Platelets: 357 10*3/uL (ref 150–400)
RBC: 2.78 MIL/uL — ABNORMAL LOW (ref 3.87–5.11)
RDW: 14.3 % (ref 11.5–15.5)
WBC: 15.4 10*3/uL — ABNORMAL HIGH (ref 4.0–10.5)
nRBC: 0 % (ref 0.0–0.2)

## 2021-12-07 LAB — COMPREHENSIVE METABOLIC PANEL
ALT: 75 U/L — ABNORMAL HIGH (ref 0–44)
AST: 121 U/L — ABNORMAL HIGH (ref 15–41)
Albumin: 1.6 g/dL — ABNORMAL LOW (ref 3.5–5.0)
Alkaline Phosphatase: 203 U/L — ABNORMAL HIGH (ref 38–126)
Anion gap: 7 (ref 5–15)
BUN: 14 mg/dL (ref 8–23)
CO2: 34 mmol/L — ABNORMAL HIGH (ref 22–32)
Calcium: 8.4 mg/dL — ABNORMAL LOW (ref 8.9–10.3)
Chloride: 98 mmol/L (ref 98–111)
Creatinine, Ser: 0.41 mg/dL — ABNORMAL LOW (ref 0.44–1.00)
GFR, Estimated: >60 mL/min (ref >60)
Glucose, Bld: 146 mg/dL — ABNORMAL HIGH (ref 70–99)
Potassium: 3.7 mmol/L (ref 3.5–5.1)
Sodium: 139 mmol/L (ref 135–145)
Total Bilirubin: 0.3 mg/dL (ref 0.3–1.2)
Total Protein: 5.3 g/dL — ABNORMAL LOW (ref 6.5–8.1)

## 2021-12-07 LAB — GLUCOSE, CAPILLARY
Glucose-Capillary: 115 mg/dL — ABNORMAL HIGH (ref 70–99)
Glucose-Capillary: 140 mg/dL — ABNORMAL HIGH (ref 70–99)
Glucose-Capillary: 150 mg/dL — ABNORMAL HIGH (ref 70–99)
Glucose-Capillary: 151 mg/dL — ABNORMAL HIGH (ref 70–99)
Glucose-Capillary: 154 mg/dL — ABNORMAL HIGH (ref 70–99)
Glucose-Capillary: 157 mg/dL — ABNORMAL HIGH (ref 70–99)
Glucose-Capillary: 65 mg/dL — ABNORMAL LOW (ref 70–99)

## 2021-12-07 MED ORDER — DEXTROSE 50 % IV SOLN: 12.5000 g | INTRAVENOUS | Status: AC

## 2021-12-07 MED ORDER — SODIUM CHLORIDE 0.9 % IV SOLN
250.0000 mL | INTRAVENOUS | Status: DC
Start: 2021-12-07 — End: 2021-12-15
  Administered 2021-12-10: 250 mL via INTRAVENOUS

## 2021-12-07 MED ORDER — ACETAZOLAMIDE 250 MG PO TABS: 250.0000 mg | ORAL_TABLET | Freq: Two times a day (BID) | ORAL | Status: DC

## 2021-12-07 MED ORDER — QUETIAPINE FUMARATE 25 MG PO TABS
25.0000 mg | ORAL_TABLET | Freq: Every day | ORAL | Status: DC
Start: 2021-12-07 — End: 2021-12-07

## 2021-12-07 MED ORDER — DOCUSATE SODIUM 50 MG/5ML PO LIQD: 100.0000 mg | Freq: Two times a day (BID) | ORAL | Status: DC | PRN

## 2021-12-07 MED ORDER — FUROSEMIDE 10 MG/ML IJ SOLN: 40.0000 mg | Freq: Once | INTRAMUSCULAR | Status: DC

## 2021-12-07 MED ORDER — POTASSIUM CHLORIDE 20 MEQ PO PACK
40.0000 meq | PACK | Freq: Once | ORAL | Status: AC
  Administered 2021-12-07: 40 meq
  Filled 2021-12-07: qty 2

## 2021-12-07 MED ORDER — FUROSEMIDE 10 MG/ML IJ SOLN
40.0000 mg | Freq: Two times a day (BID) | INTRAMUSCULAR | Status: DC
  Administered 2021-12-07 – 2021-12-09 (×4): 40 mg via INTRAVENOUS
  Filled 2021-12-07 (×3): qty 4

## 2021-12-07 MED ORDER — BETHANECHOL CHLORIDE 10 MG PO TABS
10.0000 mg | ORAL_TABLET | Freq: Three times a day (TID) | ORAL | Status: DC
Start: 2021-12-07 — End: 2021-12-12
  Administered 2021-12-07 – 2021-12-12 (×16): 10 mg
  Filled 2021-12-07 (×17): qty 1

## 2021-12-07 MED ORDER — METOPROLOL TARTRATE 25 MG/10 ML ORAL SUSPENSION: 12.5000 mg | Freq: Two times a day (BID) | ORAL | Status: DC

## 2021-12-07 MED ORDER — NOREPINEPHRINE 4 MG/250ML-% IV SOLN
2.0000 ug/min | INTRAVENOUS | Status: DC
  Administered 2021-12-07: 2 ug/min via INTRAVENOUS

## 2021-12-07 MED ORDER — DEXTROSE 50 % IV SOLN
INTRAVENOUS | Status: AC
  Administered 2021-12-07: 12.5 g via INTRAVENOUS
  Filled 2021-12-07: qty 50

## 2021-12-07 MED ORDER — FUROSEMIDE 10 MG/ML IJ SOLN
40.0000 mg | Freq: Two times a day (BID) | INTRAMUSCULAR | Status: DC
  Filled 2021-12-07: qty 4

## 2021-12-07 MED ORDER — BISACODYL 10 MG RE SUPP: 10.0000 mg | Freq: Every day | RECTAL | Status: DC | PRN

## 2021-12-07 MED ORDER — ACETAZOLAMIDE 250 MG PO TABS
250.0000 mg | ORAL_TABLET | Freq: Two times a day (BID) | ORAL | Status: AC
  Administered 2021-12-07 – 2021-12-08 (×4): 250 mg
  Filled 2021-12-07 (×4): qty 1

## 2021-12-07 MED ORDER — QUETIAPINE FUMARATE 25 MG PO TABS
25.0000 mg | ORAL_TABLET | Freq: Every day | ORAL | Status: DC
  Administered 2021-12-07 – 2021-12-09 (×3): 25 mg
  Filled 2021-12-07 (×3): qty 1

## 2021-12-07 MED ORDER — GUAIFENESIN 200 MG PO TABS
400.0000 mg | ORAL_TABLET | Freq: Four times a day (QID) | ORAL | Status: DC
Start: 2021-12-07 — End: 2021-12-12
  Administered 2021-12-07 – 2021-12-12 (×19): 400 mg
  Filled 2021-12-07 (×21): qty 2

## 2021-12-07 NOTE — Plan of Care (Signed)
°  Problem: Clinical Measurements: Goal: Cardiovascular complication will be avoided Outcome: Progressing   Problem: Nutrition: Goal: Adequate nutrition will be maintained Outcome: Progressing   Problem: Role Relationship: Goal: Method of communication will improve Outcome: Progressing

## 2021-12-07 NOTE — Progress Notes (Addendum)
Advanced Heart Failure Rounding Note  PCP-Cardiologist: None   Subjective:   12/20: Repeat Echo  EF 65-70% no RWMA  Remains on Levaquin for legionella PNA.   Drowsy on vent. FiO2 60-->50%   Objective:   Weight Range: 54.2 kg Body mass index is 20.51 kg/m.   Vital Signs:   Temp:  [98.5 F (36.9 C)-99.6 F (37.6 C)] 99.4 F (37.4 C) (02/22 0800) Pulse Rate:  [88-133] 101 (02/22 1200) Resp:  [10-43] 15 (02/22 1200) BP: (92-177)/(47-130) 105/68 (02/22 1200) SpO2:  [87 %-97 %] 97 % (02/22 1200) FiO2 (%):  [40 %-60 %] 60 % (02/22 0906) Last BM Date : 12/03/21  Weight change: Filed Weights   12/04/21 0500 12/05/21 0500 12/06/21 0500  Weight: 56 kg 56 kg 54.2 kg    Intake/Output:   Intake/Output Summary (Last 24 hours) at 12/07/2021 1230 Last data filed at 12/07/2021 1200 Gross per 24 hour  Intake 1835.41 ml  Output 865 ml  Net 970.41 ml      Physical Exam   General:  Intubated / drowsy HEENT: ETT Neck: supple. JVP 9-10 . Carotids 2+ bilat; no bruits. No lymphadenopathy or thryomegaly appreciated. Cor: PMI nondisplaced. Regular rate & rhythm. No rubs, gallops or murmurs. Lungs: clear Abdomen: soft, nontender, nondistended. No hepatosplenomegaly. No bruits or masses. Good bowel sounds. Extremities: no cyanosis, clubbing, rash, edema. Upper extremities edematous.  Neuro: Intubated. Follows commands. MAE.    Telemetry  SR /ST 90-100s  EKG    No EKG to review   Labs    CBC Recent Labs    12/06/21 0430 12/07/21 0536  WBC 15.1* 15.4*  NEUTROABS 12.5* 12.6*  HGB 10.2* 10.2*  HCT 30.3* 29.3*  MCV 105.9* 105.4*  PLT 328 619   Basic Metabolic Panel Recent Labs    12/05/21 0500 12/06/21 0430 12/07/21 0536  NA 134* 135 139  K 4.1 3.2* 3.7  CL 90* 95* 98  CO2 35* 32 34*  GLUCOSE 126* 133* 146*  BUN _0 CREATININE 0.42* 0.40* 0.41*  CALCIUM 7.5* 7.9* 8.4*  MG 2.3  --   --   PHOS 2.3* 4.0  --    Liver Function Tests Recent Labs     12/06/21 0430 12/07/21 0536  AST 193* 121*  ALT 90* 75*  ALKPHOS 183* 203*  BILITOT 0.4 0.3  PROT 5.2* 5.3*  ALBUMIN 1.6* 1.6*   No results for input(s): LIPASE, AMYLASE in the last 72 hours. Cardiac Enzymes No results for input(s): CKTOTAL, CKMB, CKMBINDEX, TROPONINI in the last 72 hours.   BNP: BNP (last 3 results) Recent Labs    11/30/21 0828  BNP 1,181.2*    ProBNP (last 3 results) No results for input(s): PROBNP in the last 8760 hours.   D-Dimer No results for input(s): DDIMER in the last 72 hours. Hemoglobin A1C No results for input(s): HGBA1C in the last 72 hours.  Fasting Lipid Panel Recent Labs    12/05/21 0500  TRIG 205*   Thyroid Function Tests No results for input(s): TSH, T4TOTAL, T3FREE, THYROIDAB in the last 72 hours.  Invalid input(s): FREET3  Other results:   Imaging    No results found.   Medications:     Scheduled Medications:  acetaZOLAMIDE  250 mg Per Tube BID   aspirin  81 mg Per Tube Daily   bethanechol  10 mg Per Tube TID   chlorhexidine gluconate (MEDLINE KIT)  15 mL Mouth Rinse BID   Chlorhexidine Gluconate Cloth  6 each Topical Q0600   clonazepam  1 mg Per Tube BID   docusate  100 mg Per Tube BID   folic acid  1 mg Per Tube Daily   furosemide  40 mg Intravenous Q12H   guaiFENesin  400 mg Per Tube Q6H   insulin aspart  0-9 Units Subcutaneous Q4H   insulin glargine-yfgn  5 Units Subcutaneous Daily   ipratropium-albuterol  3 mL Nebulization TID   mouth rinse  15 mL Mouth Rinse 10 times per day   metoprolol tartrate  25 mg Per Tube BID   multivitamin with minerals  1 tablet Per Tube Daily   nicotine  21 mg Transdermal Daily   oxyCODONE-acetaminophen  2 tablet Per Tube TID   pantoprazole sodium  40 mg Per Tube Daily   polyethylene glycol  17 g Per Tube Daily   QUEtiapine  25 mg Per Tube QHS   thiamine  100 mg Per Tube Daily    Infusions:  feeding supplement (OSMOLITE 1.5 CAL) 1,000 mL (12/06/21 1230)   fentaNYL  infusion INTRAVENOUS 200 mcg/hr (12/07/21 1220)   levofloxacin (LEVAQUIN) IV Stopped (12/07/21 1004)   norepinephrine (LEVOPHED) Adult infusion Stopped (12/06/21 0147)   propofol (DIPRIVAN) infusion Stopped (12/06/21 0725)    PRN Medications: docusate, fentaNYL, ipratropium-albuterol, midazolam    Patient Profile   66 y/o female w/ h/o tobacco and ETOH abuse (drinks ~4 glasses of wine/day), no prior cardiac history. Admitted w/ sepsis PNA and found to have reduced LV dysfunction on echo. EF 40-45% w/ WMA pattern c/w possible atypical stressed induced CM.    Assessment/Plan   1. Shock  - lactic acid 3.1>>1.7>>2.1>2.5>1.8 - primarily septic shock in the setting of b/l PNA  - Stable off pressors.  - c/w abx per PCCM    2. Legionella PNA - abx per PCCM - Blood cultures NGTD - Resp culture - no organisms, rare candida   3. Acute Systolic Heart Failure  - Echo 2/14 EF read 30-35% (feel more like 40-45%), RV normal, WMA pattern c/w stress induced CM - Repeat Echo 2/20: EF 65-70% no RWMA supporting stress induced CM, now recovered  - vRemains volume overloaded. Continue IV lasix today. May be able to stop tomorrow.   4. Sinus tachycardia -  Likely d/t acute illness - Rate better today. Continue  metoprolol tartrate 25 mg BID.    5. Acute Hypoxic Respiratory Failure - b/l PNA  - intubated  - management per PCCM    6. Elevated LFTs - Suspect 2/2 shock + ETOH related  - LFTs continue to trend down.    7. Hypokalemia/ Hypomagnesemia  - K 3.7 today.  - Check mag in am.   8. Tobacco/ ETOH Abuse - cessation advised   7. Elevated HS trop  - Hs trop [phone] - suspect demand ischemia  - EF recovered  - Continue 81 mg aspirin   Length of Stay: [place], NP  12/07/2021, 12:30 PM  Advanced Heart Failure Team Pager [phone] (M-F; 7a - 5p)  Please contact [facility] for night-coverage after hours (5p -7a ) and weekends on [email or web address]  Agree.  Remains on  vent. Drowsy. Continues to diurese.   General:  Weak appearing. Drowsy HEENT: normal + ETT Neck: supple. JVP up Carotids 2+ bilat; no bruits. No lymphadenopathy or thryomegaly appreciated. Cor: PMI nondisplaced. Regular rate & rhythm. No rubs, gallops or murmurs. Lungs: coarse Abdomen: soft, nontender, nondistended. No hepatosplenomegaly. No bruits or masses. Good bowel sounds. Extremities: no  cyanosis, clubbing, rash, edema Neuro: drowsy on vent  Off pressors. Still with volume overload. Continue IV lasix for at least one more day.   Will discuss need for possible cath once extubated. Continue medical management for now.   CRITICAL CARE Performed by: Glori Bickers  Total critical care time: 35 minutes  Critical care time was exclusive of separately billable procedures and treating other patients.  Critical care was necessary to treat or prevent imminent or life-threatening deterioration.  Critical care was time spent personally by me (independent of midlevel providers or residents) on the following activities: development of treatment plan with patient and/or surrogate as well as nursing, discussions with consultants, evaluation of patient's response to treatment, examination of patient, obtaining history from patient or surrogate, ordering and performing treatments and interventions, ordering and review of laboratory studies, ordering and review of radiographic studies, pulse oximetry and re-evaluation of patient's condition.  Glori Bickers, MD  3:28 PM

## 2021-12-07 NOTE — Progress Notes (Signed)
NAME:  Gina Cherry, MRN:  [id number], DOB:  [birth year], LOS: 8 ADMISSION DATE:  11/28/2021, CONSULTATION DATE:  12/07/21 REFERRING MD:  Lysbeth Galas, CHIEF COMPLAINT:   worsening hypoxia/dyspnea  History of Present Illness:  Gina Cherry is a 66 y.o. F with PMH of tobacco and ETOH use who presented with CAP ,bilateral pneumonia with WBC 13k and mildly elevated LFT' s along with hypokalemia.  She was admitted to the floor, intubated 2/15 for ARDS Course complicated by shock, stress cardiomyopathy  She smoking 1/2 ppd for 40+ years  Pertinent  Medical History  Tobacco and ETOH use   Significant Hospital Events: Including procedures, antibiotic start and stop dates in addition to other pertinent events   2/13 admitted to teaching service, later required HFNC and ICU trxfr, On Vanc/Ceftriaxone/Azithromycin 2/14 wbc ct up. Vancs stopped as MRSA PCR neg  ECHO Mderately reduced LV function with regional  wall motion abnormalities, 30-35%. There is hypokinesis of the basal to  mid segments of all walls of the LV. There is apical hyperkinesis. This pattern could represent an atypical  stress induced cardiomyopathy pattern as this spares the apex  (non-coronary distributions).  2/15 increased O2 needs. CXR worse. Mottled. Concern for shock. Vanc added back. Right IJ CVL placed. Right radial aline placed. Cards consulted. Intubated. BAL sent  2/16 remains intubated with high vent settings including FiO2 70% PEEP 10.  Lower extremities no longer mottled.  Proned for worsening ARDS 2/17 un-proned.  Started on levaquin, urine Legionella positive, down to 60%/ 10 peep, ongoing levophed 2/18 tmax 103, NE at 11 mcg, coox 88%, CVP 10, diuresed, down down 40%/ 8  Interim History / Subjective:  Up from 40% to 60%/8 peep last night Tmax 99.6 On WUA- MAE and follows commands   Objective   Blood pressure (!) 150/69, pulse (!) 108, temperature 99.6 F (37.6 C), temperature source Axillary, resp. rate  19, height _0  (1.626 m), weight 54.2 kg, SpO2 91 %.    Vent Mode: PRVC FiO2 (%):  [40 %-60 %] 60 % Set Rate:  [16 bmp] 16 bmp Vt Set:  [430 mL] 430 mL PEEP:  [8 cmH20] 8 cmH20 Pressure Support:  [8 cmH20] 8 cmH20 Plateau Pressure:  [12 cmH20-15 cmH20] 15 cmH20   Intake/Output Summary (Last 24 hours) at 12/07/2021 7494 Last data filed at 12/07/2021 0600 Gross per 24 hour  Intake 1643.12 ml  Output 2015 ml  Net -371.88 ml    Filed Weights   12/04/21 0500 12/05/21 0500 12/06/21 0500  Weight: 56 kg 56 kg 54.2 kg   Physical Exam:  Fent 175 General:  critically ill elderly female in bed in NAD, sedated, agitated HEENT: MM pink/moist, ETT/ OGT, pupils 3/reactive, cont scleral edema Neuro:  sedated- sedation paused, was able to wiggle toes/ thumbs up/ raise both arms CV:  ST, no murmur PULM:  non labored on MV, bilateral rhonchi R>L , no wheezing GI:  less soft but +bs and , foley  Extremities: warm/dry, 2+ pulse bilaterally, generalized 1-2+ edema,  arm    UOP 2L ml/ 24hrs -301.2/ net  Wts 45.4> 53.5> 55.2> 56> 56>54.2  Labs reviewed: hgb stable, WBC 19.1> 15.7> 14.8>15.1>15.4   Resolved Hospital Problem list   New acute metabolic acidosis 2/2 sepsis +/- ETOH w/d  Assessment & Plan:  Principal Problem:   CAP (community acquired pneumonia) Active Problems:   Septic shock ([place])   Weakness   Acute respiratory failure with hypoxia (HCC)   Elevated LFTs  Tobacco abuse   Risk for falls   Protein-calorie malnutrition, severe   Takotsubo cardiomyopathy   Malnutrition of moderate degree  ARDS secondary to Legionella PNA Long term tobacco use -2/16 proned >2/17 P: - Continue MV support, 8cc/kg IBW with goal Pplat <30 and DP<15  - O2/ peep needs continue to slowly improve - VAP prevention protocol/ PPI - PAD protocol for sedation> did not respond to precedex.  Cont fentanyl/ propofol.  Enteral sedation started 2/18 w/ scheduled bowel regimen.  Increasing klonopin and  changing to perocet TID for multimodal pain control in hopes to greatly reduce IV sedation - continue weaning FiO2 - cont lasix with K - Diamox to prevent worsening acidosis during diaeresis  - continue levaquin for 10 days of therapy, stop date 2/27 - duonebs TID and prn  - low sedation and PT  - guaiphenesin to help with mucus clearing - Chest physiotherapy  Septic shock secondary to PNA c/b new New stress induced CM (EF 30-35%)  Legionella pneumonia P: - decreasing vasopressor requirements despite diuresis yesterday, cont NE for MAP goal > 65 - trending CVP - levaquin started 2/17, follow culture data and BAL  Likely right pleural effusion, parapneumonic - assessed on bedside US 2/18> consolidated lung, minimal effusion, not good window to safely consider thora.  Continue diuresis      Likely COPD underlying - duonebs as above - will need f/u in pulmonary office for PFTs and follow-up  New systolic congestive heart failure Sinus tachycardia versus atypical atrial flutter - Echo with EF of 30 to 35% with apical hypokinesis, pattern could represent atypical stress-induced cardiomyopathy vs pre-existing, RV normal - Suspect new decreased EF secondary to critical illness vs pre-existing CM (? ETOH related) vs ischemic CM - did not tolerate dobutamine P: - HF following, appreciate recs  - will need ischemic eval prior to discharge - GDMT once off pressors - strict I/Os, daily wts, UOP   Fluid & Electrolyte imbalance: Hyponatremia, hypokalemia, hypophosphatemia, hypocalcemia  P: - continue to aggressive replete electrolytes> calcium gluconate, kphos, mag - goal K>4, Mag> 2 - trend BMET/ mag/ phos  Elevated LFT's Alcohol Use -Acute hepatitis panel neg; slowly improving  P: - Avoid hepatotoxins - continue thiamine, folate, and multivitamin - Cessation education when appropriate  Fall risk P: When appropriate PT/OT  Hyperglycemia - cont SSI sensitive, semglee 5  units daily  GI/GU - bowel reg - bethanechol  Best Practice (right click and "Reselect all SmartList Selections" daily)   Diet/type: tubefeeds DVT prophylaxis: LMWH GI prophylaxis: PPI Lines: Central line and Arterial Line (R IJ and left radial) Foley:  Yes, and it is still needed Code Status:  full code Last date of multidisciplinary goals of care discussion [Discussed intubation with patient and SO, would want full code]  Husband updated at bedside 2/20.   CRITICAL CARE Performed by: Bryan Lemma  Total critical care time: 35 minutes  Critical care time was exclusive of separately billable procedures and treating other patients.  Critical care was necessary to treat or prevent imminent or life-threatening deterioration.  Critical care was time spent personally by me on the following activities: development of treatment plan with patient and/or surrogate as well as nursing, discussions with consultants, evaluation of patient's response to treatment, examination of patient, obtaining history from patient or surrogate, ordering and performing treatments and interventions, ordering and review of laboratory studies, ordering and review of radiographic studies, pulse oximetry and re-evaluation of patient's condition.

## 2021-12-07 NOTE — Progress Notes (Signed)
eLink Physician-Brief Progress Note Patient Name: Gina Cherry DOB: 27-Jul-1956 MRN: [id number]   Date of Service  12/07/2021  HPI/Events of Note  Hypotension - BP = 78/40 and HR = 102 post Klonopin, Percocet, Versed and Metoprolol.   eICU Interventions  Plan: Norepinephrine IV infusion via PIV. Titrate to MAP >= 65. D/C Klonopin. Decrease Metoprolol dose to 12.5 mg per tube Q 12 hours.      Intervention Category Major Interventions: Hypotension - evaluation and management  Lenell Antu 12/07/2021, 10:33 PM

## 2021-12-07 NOTE — Progress Notes (Signed)
An USGPIV (ultrasound guided PIV) has been placed for short-term vasopressor infusion. A correctly placed ivWatch must be used when administering Vasopressors. Should this treatment be needed beyond 72 hours, central line access should be obtained.  It will be the responsibility of the bedside nurse to follow best practice to prevent extravasations.   ?

## 2021-12-07 NOTE — Procedures (Signed)
Cortrak ? ?Person Inserting Tube:  Gina Cherry, RD ?Tube Type:  Cortrak - 43 inches ?Tube Size:  10 ?Tube Location:  Left nare ?Initial Placement:  Stomach ?Secured by: Bridle ?Technique Used to Measure Tube Placement:  Marking at nare/corner of mouth ?Cortrak Secured At:  70 cm ?Procedure Comments:  Cortrak Tube Team Note: ? ?Consult received to place a Cortrak feeding tube.  ? ?X-ray is required, abdominal x-ray has been ordered by the Cortrak team. Please confirm tube placement before using the Cortrak tube.  ? ?If the tube becomes dislodged please keep the tube and contact the Cortrak team at [email or web address] ([email or web address]) for replacement.  ?If after hours and replacement cannot be delayed, place a NG tube and confirm placement with an abdominal x-ray.  ? ? ? Gina Allman MS, Gina Cherry, Gina Cherry, Gina Cherry ?Registered Dietitian III ?Clinical Nutrition ?RD Pager and On-Call Pager Number Located in Amion  ? ? ? ? ?

## 2021-12-08 DIAGNOSIS — J9601 Acute respiratory failure with hypoxia: Secondary | ICD-10-CM | POA: Diagnosis not present

## 2021-12-08 DIAGNOSIS — R6521 Severe sepsis with septic shock: Secondary | ICD-10-CM | POA: Diagnosis not present

## 2021-12-08 DIAGNOSIS — I5181 Takotsubo syndrome: Secondary | ICD-10-CM | POA: Diagnosis not present

## 2021-12-08 DIAGNOSIS — A419 Sepsis, unspecified organism: Secondary | ICD-10-CM | POA: Diagnosis not present

## 2021-12-08 DIAGNOSIS — L8990 Pressure ulcer of unspecified site, unspecified stage: Secondary | ICD-10-CM | POA: Insufficient documentation

## 2021-12-08 LAB — CBC WITH DIFFERENTIAL/PLATELET
Abs Immature Granulocytes: 0.08 10*3/uL — ABNORMAL HIGH (ref 0.00–0.07)
Basophils Absolute: 0 10*3/uL (ref 0.0–0.1)
Basophils Relative: 0 %
Eosinophils Absolute: 0.1 10*3/uL (ref 0.0–0.5)
Eosinophils Relative: 1 %
HCT: 29.2 % — ABNORMAL LOW (ref 36.0–46.0)
Hemoglobin: 10 g/dL — ABNORMAL LOW (ref 12.0–15.0)
Immature Granulocytes: 1 %
Lymphocytes Relative: 8 %
Lymphs Abs: 1.1 10*3/uL (ref 0.7–4.0)
MCH: 36.8 pg — ABNORMAL HIGH (ref 26.0–34.0)
MCHC: 34.2 g/dL (ref 30.0–36.0)
MCV: 107.4 fL — ABNORMAL HIGH (ref 80.0–100.0)
Monocytes Absolute: 1.1 10*3/uL — ABNORMAL HIGH (ref 0.1–1.0)
Monocytes Relative: 8 %
Neutro Abs: 10.9 10*3/uL — ABNORMAL HIGH (ref 1.7–7.7)
Neutrophils Relative %: 82 %
Platelets: 374 10*3/uL (ref 150–400)
RBC: 2.72 MIL/uL — ABNORMAL LOW (ref 3.87–5.11)
RDW: 14.6 % (ref 11.5–15.5)
WBC: 13.2 10*3/uL — ABNORMAL HIGH (ref 4.0–10.5)
nRBC: 0 % (ref 0.0–0.2)

## 2021-12-08 LAB — COMPREHENSIVE METABOLIC PANEL
ALT: 67 U/L — ABNORMAL HIGH (ref 0–44)
AST: 91 U/L — ABNORMAL HIGH (ref 15–41)
Albumin: 1.7 g/dL — ABNORMAL LOW (ref 3.5–5.0)
Alkaline Phosphatase: 176 U/L — ABNORMAL HIGH (ref 38–126)
Anion gap: 8 (ref 5–15)
BUN: 17 mg/dL (ref 8–23)
CO2: 33 mmol/L — ABNORMAL HIGH (ref 22–32)
Calcium: 8.5 mg/dL — ABNORMAL LOW (ref 8.9–10.3)
Chloride: 97 mmol/L — ABNORMAL LOW (ref 98–111)
Creatinine, Ser: 0.43 mg/dL — ABNORMAL LOW (ref 0.44–1.00)
GFR, Estimated: >60 mL/min (ref >60)
Glucose, Bld: 166 mg/dL — ABNORMAL HIGH (ref 70–99)
Potassium: 3.6 mmol/L (ref 3.5–5.1)
Sodium: 138 mmol/L (ref 135–145)
Total Bilirubin: 0.4 mg/dL (ref 0.3–1.2)
Total Protein: 5.4 g/dL — ABNORMAL LOW (ref 6.5–8.1)

## 2021-12-08 LAB — GLUCOSE, CAPILLARY
Glucose-Capillary: 183 mg/dL — ABNORMAL HIGH (ref 70–99)
Glucose-Capillary: 137 mg/dL — ABNORMAL HIGH (ref 70–99)
Glucose-Capillary: 132 mg/dL — ABNORMAL HIGH (ref 70–99)
Glucose-Capillary: 101 mg/dL — ABNORMAL HIGH (ref 70–99)
Glucose-Capillary: 169 mg/dL — ABNORMAL HIGH (ref 70–99)
Glucose-Capillary: 151 mg/dL — ABNORMAL HIGH (ref 70–99)

## 2021-12-08 LAB — LEGIONELLA PNEUMOPHILA SEROGP 1 UR AG: L. pneumophila Serogp 1 Ur Ag: POSITIVE — AB

## 2021-12-08 MED ORDER — METOPROLOL TARTRATE 25 MG PO TABS
25.0000 mg | ORAL_TABLET | Freq: Two times a day (BID) | ORAL | Status: DC
Start: 2021-12-08 — End: 2021-12-14
  Administered 2021-12-08 – 2021-12-13 (×12): 25 mg
  Filled 2021-12-08 (×13): qty 1

## 2021-12-08 MED ORDER — ACETAMINOPHEN 325 MG PO TABS: 650.0000 mg | ORAL_TABLET | Freq: Four times a day (QID) | ORAL | Status: DC | PRN

## 2021-12-08 MED ORDER — WHITE PETROLATUM EX OINT
TOPICAL_OINTMENT | CUTANEOUS | Status: DC | PRN
  Filled 2021-12-08: qty 28.35

## 2021-12-08 MED ORDER — POTASSIUM CHLORIDE 20 MEQ PO PACK
40.0000 meq | PACK | Freq: Once | ORAL | Status: AC
  Administered 2021-12-08: 40 meq
  Filled 2021-12-08: qty 2

## 2021-12-08 MED ORDER — ACETAMINOPHEN 325 MG PO TABS
650.0000 mg | ORAL_TABLET | Freq: Four times a day (QID) | ORAL | Status: DC | PRN
  Administered 2021-12-08 – 2021-12-14 (×2): 650 mg
  Filled 2021-12-08 (×2): qty 2

## 2021-12-08 NOTE — Progress Notes (Signed)
Physical Therapy Treatment Patient Details Name: Gina Cherry MRN: [id number] DOB: [birth year] Today's Date: 12/08/2021   History of Present Illness Pt is 66 yo female who presents with 5 day h/o cough, SOB, diarrhea, and weakness that resulted in a fall with head lacerations, CT (-) for ICH. Pt found to have sepsis due to RLL PNA. Pt with worsening respiratory status on 2/15 and VDRF.  PMH: smoker, EtOH abuse    PT Comments    Patient progressing this session able to tolerate up sitting EOB though focused on trying to remove ETT and coretrack needing assist for balance as pt leaning down to try to get her hands on tubes as hands held at her sides.  She did follow commands well for in bed therex and was able to assist with scooting forward to EOB and toward HOB.  Patient anxious, but eager to mobilize.  Continue to feel she should progress to tolerate acute inpatient rehab prior to d/c home.  PT to continue to follow acutely.    Recommendations for follow up therapy are one component of a multi-disciplinary discharge planning process, led by the attending physician.  Recommendations may be updated based on patient status, additional functional criteria and insurance authorization.  Follow Up Recommendations  Acute inpatient rehab (3hours/day)     Assistance Recommended at Discharge Frequent or constant Supervision/Assistance  Patient can return home with the following Two people to help with walking and/or transfers;Two people to help with bathing/dressing/bathroom;Assistance with cooking/housework;Direct supervision/assist for medications management;Direct supervision/assist for financial management;Assist for transportation;Help with stairs or ramp for entrance   Equipment Recommendations  Rolling walker (2 wheels)    Recommendations for Other Services       Precautions / Restrictions Precautions Precautions: Fall     Mobility  Bed Mobility Overal bed mobility: Needs  Assistance Bed Mobility: Supine to Sit, Sit to Supine     Supine to sit: HOB elevated, Mod assist Sit to supine: Mod assist, +2 for safety/equipment   General bed mobility comments: assist for pulling up to sit and step by step cues for moving legs over off the bed; to supine assist for lines and for legs up in bed, pt not laying flat despite bed flat so raised HOB up for comfort    Transfers Overall transfer level: Needs assistance   Transfers: Bed to chair/wheelchair/BSC            Lateral/Scoot Transfers: Mod assist, +2 physical assistance General transfer comment: scooting up toward [facility] with pt assisting x 3 reps    Ambulation/Gait                   Stairs             Wheelchair Mobility    Modified Rankin (Stroke Patients Only)       Balance Overall balance assessment: Needs assistance Sitting-balance support: Bilateral upper extremity supported Sitting balance-Leahy Scale: Poor Sitting balance - Comments: anterior lean throughout and at times due to attempting to reach to pull out tubes when hands held down to prevent pt from self extubation Postural control: Other (comment) (anterior lean)                                  Cognition Arousal/Alertness: Awake/alert Behavior During Therapy: Anxious Overall Cognitive Status: Difficult to assess  General Comments: follows commands well, but continues to attempt to reach her mouth despite cues for not pulling tubes, etc        Exercises Other Exercises Other Exercises: AAROM shoulder flexion x 3 reps each side Other Exercises: heel slides with resisted extension x 3 each side Other Exercises: ankle circles x 6 each leg Other Exercises: seated kicks x 5 each leg    General Comments General comments (skin integrity, edema, etc.): on VDRF 50% FiO2 PEEP 5, BP Stable sitting EOB      Pertinent Vitals/Pain Pain Assessment Facial  Expression: Tense Body Movements: Protection Muscle Tension: Tense, rigid Compliance with ventilator (intubated pts.): Tolerating ventilator or movement Vocalization (extubated pts.): N/A CPOT Total: 3    Home Living                          Prior Function            PT Goals (current goals can now be found in the care plan section) Progress towards PT goals: Progressing toward goals    Frequency    Min 3X/week      PT Plan Current plan remains appropriate    Co-evaluation              AM-PAC PT "6 Clicks" Mobility   Outcome Measure  Help needed turning from your back to your side while in a flat bed without using bedrails?: A Lot Help needed moving from lying on your back to sitting on the side of a flat bed without using bedrails?: A Lot Help needed moving to and from a bed to a chair (including a wheelchair)?: Total Help needed standing up from a chair using your arms (e.g., wheelchair or bedside chair)?: Total Help needed to walk in hospital room?: Total Help needed climbing 3-5 steps with a railing? : Total 6 Click Score: 8    End of Session Equipment Utilized During Treatment: Other (comment) (vent) Activity Tolerance: Patient limited by fatigue Patient left: in bed;with call bell/phone within reach;with restraints reapplied;with bed alarm set Nurse Communication: Mobility status PT Visit Diagnosis: Muscle weakness (generalized) (M62.81);Difficulty in walking, not elsewhere classified (R26.2)     Time: 6433-2951 PT Time Calculation (min) (ACUTE ONLY): 27 min  Charges:  $Therapeutic Exercise: 8-22 mins $Therapeutic Activity: 8-22 mins                     Sheran Lawless, PT Acute Rehabilitation Services Pager:[phone] Office:[phone] 12/08/2021    Elray Mcgregor 12/08/2021, 4:26 PM

## 2021-12-08 NOTE — Progress Notes (Signed)
[facility] ADULT ICU REPLACEMENT PROTOCOL   The patient does apply for the [facility] Adult ICU Electrolyte Replacment Protocol based on the criteria listed below:   1.Exclusion criteria: TCTS patients, ECMO patients, and Dialysis patients 2. Is GFR >/= 30 ml/min? Yes.    Patient's GFR today is >60 3. Is SCr </= 2? Yes.   Patient's SCr is 0.43 mg/dL 4. Did SCr increase >/= 0.5 in 24 hours? No. 5.Pt's weight >40kg  Yes.   6. Abnormal electrolyte(s):   K 3.6  7. Electrolytes replaced per protocol 8.  Call MD STAT for K+ </= 2.5, Phos </= 1, or Mag </= 1 Physician:  S. Bobbye Morton R Franciscojavier Wronski 12/08/2021 5:57 AM

## 2021-12-08 NOTE — Progress Notes (Signed)
NAME:  Gina Cherry, MRN:  [id number], DOB:  [birth year], LOS: 9 ADMISSION DATE:  11/28/2021, CONSULTATION DATE:  12/08/21 REFERRING MD:  Lysbeth Galas, CHIEF COMPLAINT:   worsening hypoxia/dyspnea  History of Present Illness:  Gina Cherry is a 66 y.o. F with PMH of tobacco and ETOH use who presented with CAP ,bilateral pneumonia with WBC 13k and mildly elevated LFT' s along with hypokalemia.  She was admitted to the floor, intubated 2/15 for ARDS Course complicated by shock, stress cardiomyopathy  She smoking 1/2 ppd for 40+ years  Pertinent  Medical History  Tobacco and ETOH use   Significant Hospital Events: Including procedures, antibiotic start and stop dates in addition to other pertinent events   2/13 admitted to teaching service, later required HFNC and ICU trxfr, On Vanc/Ceftriaxone/Azithromycin 2/14 wbc ct up. Vancs stopped as MRSA PCR neg  ECHO Mderately reduced LV function with regional  wall motion abnormalities, 30-35%. There is hypokinesis of the basal to  mid segments of all walls of the LV. There is apical hyperkinesis. This pattern could represent an atypical  stress induced cardiomyopathy pattern as this spares the apex  (non-coronary distributions).  2/15 increased O2 needs. CXR worse. Mottled. Concern for shock. Vanc added back. Right IJ CVL placed. Right radial aline placed. Cards consulted. Intubated. BAL sent  2/16 remains intubated with high vent settings including FiO2 70% PEEP 10.  Lower extremities no longer mottled.  Proned for worsening ARDS 2/17 un-proned.  Started on levaquin, urine Legionella positive, down to 60%/ 10 peep, ongoing levophed 2/18 tmax 103, NE at 11 mcg, coox 88%, CVP 10, diuresed, down down 40%/ 8  Interim History / Subjective:  Tmax 99.6 Urin output 2.4L/ net -609.3 psr 24hrs On WUA- MAE and follows commands   Objective   Blood pressure (!) 102/55, pulse (!) 111, temperature 99.7 F (37.6 C), temperature source Axillary, resp.  rate 18, height _0  (1.626 m), weight 49.5 kg, SpO2 92 %.    Vent Mode: PRVC FiO2 (%):  [50 %-60 %] 50 % Set Rate:  [16 bmp] 16 bmp Vt Set:  [430 mL] 430 mL PEEP:  [8 cmH20] 8 cmH20 Plateau Pressure:  [10 cmH20-17 cmH20] 10 cmH20   Intake/Output Summary (Last 24 hours) at 12/08/2021 3235 Last data filed at 12/08/2021 0600 Gross per 24 hour  Intake 1800.65 ml  Output 2410 ml  Net -609.35 ml    Filed Weights   12/05/21 0500 12/06/21 0500 12/07/21 1500  Weight: 56 kg 54.2 kg 49.5 kg   Physical Exam:  General:  critically ill elderly female in bed in NAD, sedated, agitated HEENT: MM pink/moist, ETT/ OGT, pupils 3/reactive, cont scleral edema Neuro:  sedated- less responsive compared to previous exams CV:  ST, no murmur PULM:  non labored on MV, bilateral rhonchi R>L , no wheezing GI:  less soft but +bs and , foley  Extremities: warm/dry, 2+ pulse bilaterally, generalized 1-2+ edema,  arm    UOP 2.4L ml/ 24hrs -609.3/ net  Wts 45.4> 53.5> 55.2> 56> 56>54.2>49.5  Labs reviewed: hgb stable, WBC 19.1> 15.7> 14.8>15.1>15.4>13.2   Resolved Hospital Problem list   New acute metabolic acidosis 2/2 sepsis +/- ETOH w/d  Assessment & Plan:  Principal Problem:   CAP (community acquired pneumonia) Active Problems:   Septic shock ([place])   Weakness   Acute respiratory failure with hypoxia (H[facility])   Elevated LFTs   Tobacco abuse   Risk for falls   Protein-calorie malnutrition, severe   Takotsubo  cardiomyopathy   Malnutrition of moderate degree  ARDS secondary to Legionella PNA Long term tobacco use -2/16 proned >2/17 P: - Continue MV support, 8cc/kg IBW with goal Pplat <30 and DP<15  - O2/ peep needs continue to slowly improve - VAP prevention protocol/ PPI - continue weaning FiO2 - cont lasix and replete K as needed - Diamox to prevent worsening alkalosis during diaeresis  - continue levaquin for 10 days of therapy, stop date 2/27 - duonebs TID and prn  - Stopped fentanyl  and reduced peep to 5 to minimize sedation and work towards extubation.  - guaiphenesin to help with mucus clearing - PT and chest physiotherapy  Septic shock secondary to PNA c/b new New stress induced CM (EF 30-35%)  Legionella pneumonia P: - decreasing vasopressor requirements despite diuresis yesterday, cont NE for MAP goal > 65 - trending CVP - levaquin started 2/17, follow culture data and BAL  Right pleural effusion, parapneumonic - assessed on bedside US 2/18> consolidated lung, minimal effusion, not good window to safely consider thora.  Continue diuresis      Likely COPD underlying - duonebs as above - will need f/u in pulmonary office for PFTs and follow-up  New systolic congestive heart failure Sinus tachycardia versus atypical atrial flutter - Echo with EF of 30 to 35% with apical hypokinesis, pattern could represent atypical stress-induced cardiomyopathy vs pre-existing, RV normal - Suspect new decreased EF secondary to critical illness vs pre-existing CM (? ETOH related) vs ischemic CM - did not tolerate dobutamine P: - HF following, appreciate recs  - will need ischemic eval prior to discharge - GDMT once off pressors - strict I/Os, daily wts, UOP   Fluid & Electrolyte imbalance: Hyponatremia, hypokalemia, hypophosphatemia, hypocalcemia  P: - continue to aggressive replete electrolytes> calcium gluconate, kphos, mag - goal K>4, Mag> 2 - trend BMET/ mag/ phos  Elevated LFT's Alcohol Use -Acute hepatitis panel neg; slowly improving  P: - Avoid hepatotoxins - continue thiamine, folate, and multivitamin - Cessation education when appropriate  Fall risk P: When appropriate PT/OT  Hyperglycemia - cont SSI sensitive, semglee 5 units daily  GI/GU - bowel reg - bethanechol  Best Practice (right click and "Reselect all SmartList Selections" daily)   Diet/type: tubefeeds DVT prophylaxis: LMWH GI prophylaxis: PPI Lines: Central line and Arterial Line  (R IJ and left radial) Foley:  Yes, and it is still needed Code Status:  full code Last date of multidisciplinary goals of care discussion [Discussed intubation with patient and SO, would want full code]  Husband updated at bedside 2/20.   CRITICAL CARE Performed by: Bryan Lemma  Total critical care time: 35 minutes  Critical care time was exclusive of separately billable procedures and treating other patients.  Critical care was necessary to treat or prevent imminent or life-threatening deterioration.  Critical care was time spent personally by me on the following activities: development of treatment plan with patient and/or surrogate as well as nursing, discussions with consultants, evaluation of patient's response to treatment, examination of patient, obtaining history from patient or surrogate, ordering and performing treatments and interventions, ordering and review of laboratory studies, ordering and review of radiographic studies, pulse oximetry and re-evaluation of patient's condition.

## 2021-12-09 ENCOUNTER — Inpatient Hospital Stay (HOSPITAL_COMMUNITY): Payer: Medicare HMO

## 2021-12-09 DIAGNOSIS — R6521 Severe sepsis with septic shock: Secondary | ICD-10-CM | POA: Diagnosis not present

## 2021-12-09 DIAGNOSIS — A419 Sepsis, unspecified organism: Secondary | ICD-10-CM | POA: Diagnosis not present

## 2021-12-09 DIAGNOSIS — I5181 Takotsubo syndrome: Secondary | ICD-10-CM | POA: Diagnosis not present

## 2021-12-09 DIAGNOSIS — J9601 Acute respiratory failure with hypoxia: Secondary | ICD-10-CM | POA: Diagnosis not present

## 2021-12-09 LAB — BASIC METABOLIC PANEL
Anion gap: 9 (ref 5–15)
BUN: 22 mg/dL (ref 8–23)
CO2: 29 mmol/L (ref 22–32)
Calcium: 8.6 mg/dL — ABNORMAL LOW (ref 8.9–10.3)
Chloride: 100 mmol/L (ref 98–111)
Creatinine, Ser: 0.38 mg/dL — ABNORMAL LOW (ref 0.44–1.00)
GFR, Estimated: >60 mL/min (ref >60)
Glucose, Bld: 124 mg/dL — ABNORMAL HIGH (ref 70–99)
Potassium: 5.7 mmol/L — ABNORMAL HIGH (ref 3.5–5.1)
Sodium: 138 mmol/L (ref 135–145)

## 2021-12-09 LAB — GLUCOSE, CAPILLARY
Glucose-Capillary: 170 mg/dL — ABNORMAL HIGH (ref 70–99)
Glucose-Capillary: 133 mg/dL — ABNORMAL HIGH (ref 70–99)
Glucose-Capillary: 178 mg/dL — ABNORMAL HIGH (ref 70–99)
Glucose-Capillary: 213 mg/dL — ABNORMAL HIGH (ref 70–99)
Glucose-Capillary: 183 mg/dL — ABNORMAL HIGH (ref 70–99)

## 2021-12-09 LAB — CBC WITH DIFFERENTIAL/PLATELET
Abs Immature Granulocytes: 0.08 10*3/uL — ABNORMAL HIGH (ref 0.00–0.07)
Basophils Absolute: 0.1 10*3/uL (ref 0.0–0.1)
Basophils Relative: 1 %
Eosinophils Absolute: 0.2 10*3/uL (ref 0.0–0.5)
Eosinophils Relative: 1 %
HCT: 29.4 % — ABNORMAL LOW (ref 36.0–46.0)
Hemoglobin: 9.6 g/dL — ABNORMAL LOW (ref 12.0–15.0)
Immature Granulocytes: 1 %
Lymphocytes Relative: 9 %
Lymphs Abs: 1.3 10*3/uL (ref 0.7–4.0)
MCH: 35.7 pg — ABNORMAL HIGH (ref 26.0–34.0)
MCHC: 32.7 g/dL (ref 30.0–36.0)
MCV: 109.3 fL — ABNORMAL HIGH (ref 80.0–100.0)
Monocytes Absolute: 1.7 10*3/uL — ABNORMAL HIGH (ref 0.1–1.0)
Monocytes Relative: 12 %
Neutro Abs: 10.5 10*3/uL — ABNORMAL HIGH (ref 1.7–7.7)
Neutrophils Relative %: 76 %
Platelets: 368 10*3/uL (ref 150–400)
RBC: 2.69 MIL/uL — ABNORMAL LOW (ref 3.87–5.11)
RDW: 14.4 % (ref 11.5–15.5)
WBC: 13.8 10*3/uL — ABNORMAL HIGH (ref 4.0–10.5)
nRBC: 0 % (ref 0.0–0.2)

## 2021-12-09 MED ORDER — POLYETHYLENE GLYCOL 3350 17 G PO PACK: 17.0000 g | PACK | Freq: Every day | ORAL | Status: DC

## 2021-12-09 MED ORDER — PROPOFOL 1000 MG/100ML IV EMUL
0.0000 ug/kg/min | INTRAVENOUS | Status: DC
  Administered 2021-12-09: 25 ug/kg/min via INTRAVENOUS

## 2021-12-09 MED ORDER — ENOXAPARIN SODIUM 40 MG/0.4ML IJ SOSY
40.0000 mg | PREFILLED_SYRINGE | INTRAMUSCULAR | Status: DC
  Administered 2021-12-09 – 2021-12-12 (×4): 40 mg via SUBCUTANEOUS
  Filled 2021-12-09 (×4): qty 0.4

## 2021-12-09 MED ORDER — DOCUSATE SODIUM 50 MG/5ML PO LIQD: 100.0000 mg | Freq: Two times a day (BID) | ORAL | Status: DC

## 2021-12-09 MED ORDER — METHYLPREDNISOLONE SODIUM SUCC 40 MG IJ SOLR
40.0000 mg | Freq: Two times a day (BID) | INTRAMUSCULAR | Status: DC
  Administered 2021-12-09 (×2): 40 mg via INTRAVENOUS
  Filled 2021-12-09 (×2): qty 1

## 2021-12-09 MED ORDER — FUROSEMIDE 10 MG/ML IJ SOLN
60.0000 mg | Freq: Two times a day (BID) | INTRAMUSCULAR | Status: AC
  Administered 2021-12-09 – 2021-12-10 (×2): 60 mg via INTRAVENOUS
  Filled 2021-12-09 (×2): qty 6

## 2021-12-09 MED ORDER — DEXMEDETOMIDINE HCL IN NACL 400 MCG/100ML IV SOLN
0.4000 ug/kg/h | INTRAVENOUS | Status: DC
  Administered 2021-12-09: 0.2 ug/kg/h via INTRAVENOUS
  Administered 2021-12-10: 0.6 ug/kg/h via INTRAVENOUS
  Filled 2021-12-09 (×2): qty 100

## 2021-12-09 NOTE — Progress Notes (Signed)
Patient up in chair at this time. CPT held until back into bed.

## 2021-12-09 NOTE — Progress Notes (Addendum)
eLink Physician-Brief Progress Note Patient Name: Gina Cherry DOB: 1956/01/26 MRN: [id number]   Date of Service  12/09/2021  HPI/Events of Note  On ventilator, fenta 200 mg gtt. Family asking for more sedation. HR 78. MAP: 80. In synchronous with vent. Fio2 at 50%, not getting increased.   Shock/systolic CHF. Legionella Pneumonia.    eICU Interventions  Propofol sedation. For now, wean down in AM. Call back if gets hypoxeia, tachy or hypotensive.  Discussed with RN.      Intervention Category Intermediate Interventions: Respiratory distress - evaluation and management;Other:  Ranee Gosselin 12/09/2021, 4:32 AM

## 2021-12-09 NOTE — Progress Notes (Signed)
Physical Therapy Treatment Patient Details Name: Gina Cherry MRN: [id number] DOB: Oct 02, 1956 Today's Date: 12/09/2021   History of Present Illness Pt is 66 yo female who presents with 5 day h/o cough, SOB, diarrhea, and weakness that resulted in a fall with head lacerations, CT (-) for ICH. Pt found to have sepsis due to RLL PNA. Pt with worsening respiratory status on 2/15 and VDRF.  PMH: smoker, EtOH abuse    PT Comments    Patient progressing to OOB this session and improved sitting balance and activity tolerance with less agitation and pulling at tubes.  Standing difficult with tight heel cords so leaning back worse initially, but improved with each subsequent sit to stand.  Spouse in the room and happy to see her progress as well as providing needed supervision with pt untied from restraints.  She still reached to ETT 3-4 times, but not always reaching to pull (scratching face, etc).  PT will continue to follow acutely.  Remains excellent candidate for acute inpatient rehab at d/c.   Recommendations for follow up therapy are one component of a multi-disciplinary discharge planning process, led by the attending physician.  Recommendations may be updated based on patient status, additional functional criteria and insurance authorization.  Follow Up Recommendations  Acute inpatient rehab (3hours/day)     Assistance Recommended at Discharge    Patient can return home with the following Two people to help with walking and/or transfers;Two people to help with bathing/dressing/bathroom;Assistance with cooking/housework;Direct supervision/assist for medications management;Direct supervision/assist for financial management;Assist for transportation;Help with stairs or ramp for entrance   Equipment Recommendations  Rolling walker (2 wheels)    Recommendations for Other Services       Precautions / Restrictions Precautions Precautions: Fall Precaution Comments: on  vent Restrictions Weight Bearing Restrictions: No     Mobility  Bed Mobility Overal bed mobility: Needs Assistance Bed Mobility: Supine to Sit     Supine to sit: Mod assist, HOB elevated     General bed mobility comments: pulls up with UE support, assist to guide legs off bed and scoot hips    Transfers Overall transfer level: Needs assistance Equipment used: 2 person hand held assist Transfers: Sit to/from Stand, Bed to chair/wheelchair/BSC Sit to Stand: Mod assist, +2 physical assistance   Step pivot transfers: Mod assist, +2 physical assistance       General transfer comment: stood x 3 with posterior bias progressing with feet closer to bed each time and slight improvement to get hips forward; step pivot to reacliner +2 for safety and RN assisting with lines    Ambulation/Gait                   Stairs             Wheelchair Mobility    Modified Rankin (Stroke Patients Only)       Balance Overall balance assessment: Needs assistance Sitting-balance support: Feet supported Sitting balance-Leahy Scale: Fair Sitting balance - Comments: sitting EOB with hands beside and no support to trunk                                    Cognition Arousal/Alertness: Awake/alert Behavior During Therapy: Anxious Overall Cognitive Status: Difficult to assess  General Comments: following commands and interactive, still reaches to her mouth, but not always trying to pull tubes        Exercises Total Joint Exercises Ankle Circles/Pumps: AROM, 10 reps, Supine, Both Heel Slides: AROM, Both, 5 reps, Supine Straight Leg Raises: AROM, Both, 5 reps, Supine    General Comments General comments (skin integrity, edema, etc.): VSS on PRVC 50% FiO2; PEEP 5      Pertinent Vitals/Pain Pain Assessment Facial Expression: Relaxed, neutral Body Movements: Protection Compliance with ventilator (intubated pts.):  Tolerating ventilator or movement Vocalization (extubated pts.): N/A    Home Living                          Prior Function            PT Goals (current goals can now be found in the care plan section) Progress towards PT goals: Progressing toward goals    Frequency    Min 3X/week      PT Plan Current plan remains appropriate    Co-evaluation              AM-PAC PT "6 Clicks" Mobility   Outcome Measure  Help needed turning from your back to your side while in a flat bed without using bedrails?: A Lot Help needed moving from lying on your back to sitting on the side of a flat bed without using bedrails?: A Lot Help needed moving to and from a bed to a chair (including a wheelchair)?: Total Help needed standing up from a chair using your arms (e.g., wheelchair or bedside chair)?: Total Help needed to walk in hospital room?: Total Help needed climbing 3-5 steps with a railing? : Total 6 Click Score: 8    End of Session Equipment Utilized During Treatment: Gait belt Activity Tolerance: Patient limited by fatigue Patient left: in chair;with call bell/phone within reach;with family/visitor present   PT Visit Diagnosis: Muscle weakness (generalized) (M62.81);Difficulty in walking, not elsewhere classified (R26.2)     Time: 7616-0737 PT Time Calculation (min) (ACUTE ONLY): 27 min  Charges:  $Therapeutic Exercise: 8-22 mins $Therapeutic Activity: 8-22 mins                     Sheran Lawless, PT Acute Rehabilitation Services Pager:[phone] Office:[phone] 12/09/2021    Elray Mcgregor 12/09/2021, 11:54 AM

## 2021-12-09 NOTE — Progress Notes (Signed)
NAME:  Gina Cherry, MRN:  [id number], DOB:  [birth year], LOS: 72 ADMISSION DATE:  11/28/2021, CONSULTATION DATE:  12/09/21 REFERRING MD:  Gina Cherry, CHIEF COMPLAINT:   worsening hypoxia/dyspnea  History of Present Illness:  Gina Cherry is a 66 y.o. F with PMH of tobacco and ETOH use who presented with CAP ,bilateral pneumonia with WBC 13k and mildly elevated LFT' s along with hypokalemia.  She was admitted to the floor, intubated 2/15 for ARDS Course complicated by shock, stress cardiomyopathy  She smoking 1/2 ppd for 40+ years  Pertinent  Medical History  Tobacco and ETOH use   Significant Hospital Events: Including procedures, antibiotic start and stop dates in addition to other pertinent events   2/13 admitted to teaching service, later required HFNC and ICU trxfr, On Vanc/Ceftriaxone/Azithromycin 2/14 wbc ct up. Vancs stopped as MRSA PCR neg  ECHO Mderately reduced LV function with regional  wall motion abnormalities, 30-35%. There is hypokinesis of the basal to  mid segments of all walls of the LV. There is apical hyperkinesis. This pattern could represent an atypical  stress induced cardiomyopathy pattern as this spares the apex  (non-coronary distributions).  2/15 increased O2 needs. CXR worse. Mottled. Concern for shock. Vanc added back. Right IJ CVL placed. Right radial aline placed. Cards consulted. Intubated. BAL sent  2/16 remains intubated with high vent settings including FiO2 70% PEEP 10.  Lower extremities no longer mottled.  Proned for worsening ARDS 2/17 un-proned.  Started on levaquin, urine Legionella positive, down to 60%/ 10 peep, ongoing levophed 2/18 tmax 103, NE at 11 mcg, coox 88%, CVP 10, diuresed, down down 40%/ 8  Interim History / Subjective:  Tmax 99.6 Urin output 2.4L/ net -609.3 psr 24hrs On WUA- MAE and follows commands   Objective   Blood pressure 128/66, pulse 98, temperature 98.2 F (36.8 C), temperature source Oral, resp. rate (!) 23,  Cherry _0  (1.626 m), weight 50.9 kg, SpO2 91 %.    Vent Mode: PRVC FiO2 (%):  [40 %-60 %] 60 % Set Rate:  [16 bmp] 16 bmp Vt Set:  [430 mL] 430 mL PEEP:  [5 cmH20] 5 cmH20 Plateau Pressure:  [20 cmH20] 20 cmH20   Intake/Output Summary (Last 24 hours) at 12/09/2021 0853 Last data filed at 12/09/2021 0800 Gross per 24 hour  Intake 1739.97 ml  Output 2530 ml  Net -790.03 ml    Filed Weights   12/06/21 0500 12/07/21 1500 12/09/21 0408  Weight: 54.2 kg 49.5 kg 50.9 kg   Physical Exam:  General:  critically ill elderly female in bed in NAD, sedated, agitated HEENT: MM pink/moist, ETT/ OGT, pupils 3/reactive, cont scleral edema Neuro:  sedated, responsive to commands CV:  ST, no murmur PULM:  non labored on MV, bilateral rhonchi R>L, vent sounds, no wheezing GI:  soft, active bowel sounds, foley  Extremities: warm/dry, 2+ pulse bilaterally, generalized 1-2+ edema,  arm    UOP 2.4L ml/ 24hrs -609.3/ net  Wts 45.4> 53.5> 55.2> 56> 56>54.2>49.5  Labs reviewed: hgb stable, WBC 19.1> 15.7> 14.8>15.1>15.4>13.2>13.8   Resolved Hospital Problem list   New acute metabolic acidosis 2/2 sepsis +/- ETOH w/d  Assessment & Plan:  Principal Problem:   CAP (community acquired pneumonia) Active Problems:   Septic shock ([place])   Weakness   Acute respiratory failure (HCC)   Elevated LFTs   Tobacco abuse   Risk for falls   Protein-calorie malnutrition, severe   Takotsubo cardiomyopathy   Malnutrition of moderate degree  Pressure injury of skin  ARDS secondary to Legionella PNA Long term tobacco use -2/16 proned >2/17 P: - Continue MV support, 8cc/kg IBW with goal Pplat <30 and DP<15  - O2/ peep needs continue to slowly improve - VAP prevention protocol/ PPI - continue weaning FiO2 - cont lasix and replete K as needed - Diamox to prevent worsening alkalosis during diaeresis  - continue levaquin for 10 days of therapy, stop date 2/27 - duonebs TID and prn  - Increased  diaeresis, Lovenox 60 - CXR today - BNP tomorrow    - guaiphenesin to help with mucus clearing - PT and chest physiotherapy  Septic shock secondary to PNA c/b new New stress induced CM (EF 30-35%)  Legionella pneumonia P: - decreasing vasopressor requirements despite diuresis yesterday, cont NE for MAP goal > 65 - trending CVP - levaquin started 2/17, follow culture data and BAL  Right pleural effusion, parapneumonic - assessed on bedside US 2/18> consolidated lung, minimal effusion, not good window to safely consider thora.  Continue diuresis      Likely COPD underlying - duonebs as above - solumedrol 37m/mL to address possible COPD exacerbation   - will need f/u in pulmonary office for PFTs and follow-up  New systolic congestive heart failure Sinus tachycardia versus atypical atrial flutter - Echo with EF of 30 to 35% with apical hypokinesis, pattern could represent atypical stress-induced cardiomyopathy vs pre-existing, RV normal - Suspect new decreased EF secondary to critical illness vs pre-existing CM (? ETOH related) vs ischemic CM - did not tolerate dobutamine P: - HF following, appreciate recs  - will need ischemic eval prior to discharge - GDMT once off pressors - resume Lovenox, hemodynamically stable without signs of bleeding. - strict I/Os, daily wts, UOP   Fluid & Electrolyte imbalance: Hyponatremia, hypokalemia, hypophosphatemia, hypocalcemia  P: - continue to aggressive replete electrolytes> calcium gluconate, kphos, mag - goal K>4, Mag> 2 - trend BMET/ mag/ phos  Elevated LFT's Alcohol Use -Acute hepatitis panel neg; slowly improving  P: - Avoid hepatotoxins - continue thiamine, folate, and multivitamin - Cessation education when appropriate  Fall risk P: When appropriate PT/OT  Hyperglycemia - cont SSI sensitive, semglee 5 units daily  GI/GU - bowel reg - bethanechol  Best Practice (right click and "Reselect all SmartList Selections"  daily)   Diet/type: tubefeeds DVT prophylaxis: LMWH GI prophylaxis: PPI Lines: Central line and Arterial Line (R IJ and left radial) Foley:  Yes, and it is still needed Code Status:  full code Last date of multidisciplinary goals of care discussion [Discussed intubation with patient and SO, would want full code]  Husband updated at bedside 2/20.   CRITICAL CARE Performed by: VBryan Lemma Total critical care time: 35 minutes  Critical care time was exclusive of separately billable procedures and treating other patients.  Critical care was necessary to treat or prevent imminent or life-threatening deterioration.  Critical care was time spent personally by me on the following activities: development of treatment plan with patient and/or surrogate as well as nursing, discussions with consultants, evaluation of patient's response to treatment, examination of patient, obtaining history from patient or surrogate, ordering and performing treatments and interventions, ordering and review of laboratory studies, ordering and review of radiographic studies, pulse oximetry and re-evaluation of patient's condition.

## 2021-12-09 NOTE — Progress Notes (Signed)
Orthopedic Tech Progress Note Patient Details:  ANAGABRIELA YOU 09/22/1956 EU:[id number]  Ortho Devices Type of Ortho Device: Prafo boot/shoe Ortho Device/Splint Location: BLE Ortho Device/Splint Interventions: Application, Ordered   Post Interventions Patient Tolerated: Well  George Alcantar A Truxton Stupka 12/09/2021, 10:01 AM

## 2021-12-09 NOTE — Progress Notes (Signed)
175 mL fentanyl wasted in steri cycle w/ Beryl Meager RN

## 2021-12-09 NOTE — Progress Notes (Signed)
Restless and agitated, BP 184/81, start precedex gtt per Dr. Denese Killings.

## 2021-12-10 DIAGNOSIS — A419 Sepsis, unspecified organism: Secondary | ICD-10-CM | POA: Diagnosis not present

## 2021-12-10 DIAGNOSIS — R6521 Severe sepsis with septic shock: Secondary | ICD-10-CM | POA: Diagnosis not present

## 2021-12-10 DIAGNOSIS — I5181 Takotsubo syndrome: Secondary | ICD-10-CM | POA: Diagnosis not present

## 2021-12-10 DIAGNOSIS — J9601 Acute respiratory failure with hypoxia: Secondary | ICD-10-CM | POA: Diagnosis not present

## 2021-12-10 LAB — CBC WITH DIFFERENTIAL/PLATELET
Abs Immature Granulocytes: 0.06 10*3/uL (ref 0.00–0.07)
Basophils Absolute: 0 10*3/uL (ref 0.0–0.1)
Basophils Relative: 0 %
Eosinophils Absolute: 0 10*3/uL (ref 0.0–0.5)
Eosinophils Relative: 0 %
HCT: 28.2 % — ABNORMAL LOW (ref 36.0–46.0)
Hemoglobin: 9.8 g/dL — ABNORMAL LOW (ref 12.0–15.0)
Immature Granulocytes: 1 %
Lymphocytes Relative: 9 %
Lymphs Abs: 1.1 10*3/uL (ref 0.7–4.0)
MCH: 36 pg — ABNORMAL HIGH (ref 26.0–34.0)
MCHC: 34.8 g/dL (ref 30.0–36.0)
MCV: 103.7 fL — ABNORMAL HIGH (ref 80.0–100.0)
Monocytes Absolute: 0.9 10*3/uL (ref 0.1–1.0)
Monocytes Relative: 8 %
Neutro Abs: 9.4 10*3/uL — ABNORMAL HIGH (ref 1.7–7.7)
Neutrophils Relative %: 82 %
Platelets: 411 10*3/uL — ABNORMAL HIGH (ref 150–400)
RBC: 2.72 MIL/uL — ABNORMAL LOW (ref 3.87–5.11)
RDW: 13.4 % (ref 11.5–15.5)
WBC: 11.5 10*3/uL — ABNORMAL HIGH (ref 4.0–10.5)
nRBC: 0 % (ref 0.0–0.2)

## 2021-12-10 LAB — BASIC METABOLIC PANEL
Anion gap: 12 (ref 5–15)
BUN: 18 mg/dL (ref 8–23)
CO2: 33 mmol/L — ABNORMAL HIGH (ref 22–32)
Calcium: 8.5 mg/dL — ABNORMAL LOW (ref 8.9–10.3)
Chloride: 95 mmol/L — ABNORMAL LOW (ref 98–111)
Creatinine, Ser: 0.43 mg/dL — ABNORMAL LOW (ref 0.44–1.00)
GFR, Estimated: >60 mL/min (ref >60)
Glucose, Bld: 182 mg/dL — ABNORMAL HIGH (ref 70–99)
Potassium: 3.2 mmol/L — ABNORMAL LOW (ref 3.5–5.1)
Sodium: 140 mmol/L (ref 135–145)

## 2021-12-10 LAB — TRIGLYCERIDES: Triglycerides: 100 mg/dL (ref <150)

## 2021-12-10 LAB — GLUCOSE, CAPILLARY
Glucose-Capillary: 104 mg/dL — ABNORMAL HIGH (ref 70–99)
Glucose-Capillary: 127 mg/dL — ABNORMAL HIGH (ref 70–99)
Glucose-Capillary: 140 mg/dL — ABNORMAL HIGH (ref 70–99)
Glucose-Capillary: 174 mg/dL — ABNORMAL HIGH (ref 70–99)
Glucose-Capillary: 179 mg/dL — ABNORMAL HIGH (ref 70–99)
Glucose-Capillary: 211 mg/dL — ABNORMAL HIGH (ref 70–99)
Glucose-Capillary: 212 mg/dL — ABNORMAL HIGH (ref 70–99)

## 2021-12-10 LAB — BRAIN NATRIURETIC PEPTIDE: B Natriuretic Peptide: 694.9 pg/mL — ABNORMAL HIGH (ref 0.0–100.0)

## 2021-12-10 MED ORDER — PREDNISONE 20 MG PO TABS
50.0000 mg | ORAL_TABLET | Freq: Every day | ORAL | Status: DC
  Filled 2021-12-10: qty 3

## 2021-12-10 MED ORDER — INSULIN ASPART 100 UNIT/ML IJ SOLN
0.0000 [IU] | INTRAMUSCULAR | Status: DC
  Administered 2021-12-10 (×2): 3 [IU] via SUBCUTANEOUS
  Administered 2021-12-11 (×2): 1 [IU] via SUBCUTANEOUS
  Administered 2021-12-11 (×2): 2 [IU] via SUBCUTANEOUS
  Administered 2021-12-12: 1 [IU] via SUBCUTANEOUS
  Administered 2021-12-12 (×2): 2 [IU] via SUBCUTANEOUS
  Administered 2021-12-12: 1 [IU] via SUBCUTANEOUS
  Administered 2021-12-13: 3 [IU] via SUBCUTANEOUS
  Administered 2021-12-13: 2 [IU] via SUBCUTANEOUS
  Administered 2021-12-13: 1 [IU] via SUBCUTANEOUS
  Administered 2021-12-13 (×2): 2 [IU] via SUBCUTANEOUS
  Administered 2021-12-14: 1 [IU] via SUBCUTANEOUS
  Administered 2021-12-14 – 2021-12-15 (×4): 2 [IU] via SUBCUTANEOUS
  Administered 2021-12-15 – 2021-12-16 (×2): 1 [IU] via SUBCUTANEOUS

## 2021-12-10 MED ORDER — POTASSIUM CHLORIDE 20 MEQ PO PACK
20.0000 meq | PACK | ORAL | Status: AC
  Administered 2021-12-10 (×2): 20 meq
  Filled 2021-12-10 (×2): qty 1

## 2021-12-10 MED ORDER — POTASSIUM CHLORIDE 10 MEQ/100ML IV SOLN
10.0000 meq | INTRAVENOUS | Status: AC
  Administered 2021-12-10 (×4): 10 meq via INTRAVENOUS
  Filled 2021-12-10 (×4): qty 100

## 2021-12-10 MED ORDER — FUROSEMIDE 10 MG/ML IJ SOLN
60.0000 mg | Freq: Two times a day (BID) | INTRAMUSCULAR | Status: AC
  Administered 2021-12-10 – 2021-12-11 (×4): 60 mg via INTRAVENOUS
  Filled 2021-12-10 (×4): qty 6

## 2021-12-10 MED ORDER — METHYLPREDNISOLONE SODIUM SUCC 125 MG IJ SOLR
80.0000 mg | Freq: Every day | INTRAMUSCULAR | Status: DC
  Administered 2021-12-10: 80 mg via INTRAVENOUS
  Filled 2021-12-10: qty 2

## 2021-12-10 MED ORDER — OXYCODONE-ACETAMINOPHEN 5-325 MG PO TABS
2.0000 | ORAL_TABLET | Freq: Four times a day (QID) | ORAL | Status: DC | PRN
Start: 2021-12-10 — End: 2021-12-14

## 2021-12-10 NOTE — Progress Notes (Signed)
Patient placed on CPAP/PS 8/5 at this time. Patient tolerating well. Will continue to monitor.

## 2021-12-10 NOTE — Progress Notes (Signed)
eLink Physician-Brief Progress Note Patient Name: Gina Cherry DOB: November 22, 1955 MRN: [id number]   Date of Service  12/10/2021  HPI/Events of Note  hyperglycemia  eICU Interventions  Placed order for sliding scale insulin q4h (sensitive scale)        Yizel Canby M DELA CRUZ 12/10/2021, 8:04 PM

## 2021-12-10 NOTE — Procedures (Signed)
Extubation Procedure Note  Patient Details:   Name: LUELLA GARDENHIRE DOB: [birth year] MRN: [id number]   Airway Documentation:    Vent end date: 12/10/21 Vent end time: 1130   Evaluation  O2 sats: stable throughout Complications: No apparent complications Patient did tolerate procedure well. Bilateral Breath Sounds: Rhonchi   Yes  Patient tolerated wean. MD ordered to extubate. Positive for cuff leak. Patient extubated to a 10 Lpm HFNC. No signs of dyspnea or stridor noted. Patient instructed on the Incentive Spirometer, achieving 500 mL, 5 times. Patient also instructed on the flutter valve. Patient resting comfortably. RN and husband at bedside. Will continue to monitor.   Ancil Boozer 12/10/2021, 11:38 AM

## 2021-12-10 NOTE — Progress Notes (Signed)
NAME:  Gina Cherry, MRN:  [id number], DOB:  1956-01-21, LOS: 56 ADMISSION DATE:  11/28/2021, CONSULTATION DATE:  12/10/21 REFERRING MD:  Lysbeth Galas, CHIEF COMPLAINT:   worsening hypoxia/dyspnea  History of Present Illness:  Gina Cherry is a 66 y.o. F with PMH of tobacco and ETOH use who presented with CAP ,bilateral pneumonia with WBC 13k and mildly elevated LFT' s along with hypokalemia.  She was admitted to the floor, intubated 2/15 for ARDS Course complicated by shock, stress cardiomyopathy  She smoking 1/2 ppd for 40+ years  Pertinent  Medical History  Tobacco and ETOH use   Significant Hospital Events: Including procedures, antibiotic start and stop dates in addition to other pertinent events   2/13 admitted to teaching service, later required HFNC and ICU trxfr, On Vanc/Ceftriaxone/Azithromycin 2/14 wbc ct up. Vancs stopped as MRSA PCR neg  ECHO Mderately reduced LV function with regional  wall motion abnormalities, 30-35%. There is hypokinesis of the basal to  mid segments of all walls of the LV. There is apical hyperkinesis. This pattern could represent an atypical  stress induced cardiomyopathy pattern as this spares the apex  (non-coronary distributions).  2/15 increased O2 needs. CXR worse. Mottled. Concern for shock. Vanc added back. Right IJ CVL placed. Right radial aline placed. Cards consulted. Intubated. BAL sent  2/16 remains intubated with high vent settings including FiO2 70% PEEP 10.  Lower extremities no longer mottled.  Proned for worsening ARDS 2/17 un-proned.  Started on levaquin, urine Legionella positive, down to 60%/ 10 peep, ongoing levophed 2/18 tmax 103, NE at 11 mcg, coox 88%, CVP 10, diuresed, down down 40%/ 8  Interim History / Subjective:  Tolerating SBT today off all sedaion.   Objective   Blood pressure 112/64, pulse 91, temperature 98.7 F (37.1 C), temperature source Axillary, resp. rate 18, height _0  (1.626 m), weight 46.2 kg, SpO2 96  %.    Vent Mode: CPAP;PSV FiO2 (%):  [40 %-50 %] 50 % Set Rate:  [16 bmp] 16 bmp Vt Set:  [430 mL] 430 mL PEEP:  [5 cmH20] 5 cmH20 Pressure Support:  [5 cmH20-8 cmH20] 5 cmH20 Plateau Pressure:  [15 cmH20] 15 cmH20   Intake/Output Summary (Last 24 hours) at 12/10/2021 1359 Last data filed at 12/10/2021 1300 Gross per 24 hour  Intake 1501.21 ml  Output 5188 ml  Net -3686.79 ml    Filed Weights   12/07/21 1500 12/09/21 0408 12/10/21 0500  Weight: 49.5 kg 50.9 kg 46.2 kg   Physical Exam:  General:  critically ill elderly female in bed in NAD, sedated, agitated HEENT: MM pink/moist, ETT/ OGT, pupils 3/reactive, cont scleral edema Neuro:  sedated, responsive to commands CV:  ST, no murmur PULM:  non labored on MV, bilateral rhonchi R>L, vent sounds, no wheezing GI:  soft, active bowel sounds, foley  Extremities: warm/dry, 2+ pulse bilaterally, generalized 1-2+ edema,  arm    Ancillary tests personally reviewed:   Hypokalemia 3.2 CO2 33 BNP 695  Assessment & Plan:  Principal Problem:   CAP (community acquired pneumonia) Active Problems:   Septic shock ([place])   Weakness   Acute respiratory failure (HCC)   Elevated LFTs   Tobacco abuse   Risk for falls   Protein-calorie malnutrition, severe   Takotsubo cardiomyopathy   Malnutrition of moderate degree   Pressure injury of skin  Plan:  - now tolerating SBT with good saturations.  - Proceed to extubation - Progressive ambulation and diet thereafter - Continue to  diurese, continue with diamox.  - Continue course of steroids.   Best Practice (right click and "Reselect all SmartList Selections" daily)   Diet/type: tubefeeds - transition to oral intake.  DVT prophylaxis: LMWH GI prophylaxis: PPI Lines: Central line and Arterial Line (R IJ and left radial) Foley:  Yes, and it is still needed Code Status:  full code Last date of multidisciplinary goals of care discussion [Discussed intubation with patient and SO, would  want full code]  Husband updated at bedside 2/20.   CRITICAL CARE Performed by: Kipp Brood  Total critical care time: 35 minutes  Critical care time was exclusive of separately billable procedures and treating other patients.  Critical care was necessary to treat or prevent imminent or life-threatening deterioration.  Critical care was time spent personally by me on the following activities: development of treatment plan with patient and/or surrogate as well as nursing, discussions with consultants, evaluation of patient's response to treatment, examination of patient, obtaining history from patient or surrogate, ordering and performing treatments and interventions, ordering and review of laboratory studies, ordering and review of radiographic studies, pulse oximetry and re-evaluation of patient's condition.

## 2021-12-10 NOTE — Progress Notes (Signed)
[facility] ADULT ICU REPLACEMENT PROTOCOL   The patient does apply for the [facility] Adult ICU Electrolyte Replacment Protocol based on the criteria listed below:   1.Exclusion criteria: TCTS patients, ECMO patients, and Dialysis patients 2. Is GFR >/= 30 ml/min? Yes.    Patient's GFR today is >60 3. Is SCr </= 2? Yes.   Patient's SCr is 0.43 mg/dL 4. Did SCr increase >/= 0.5 in 24 hours? No. 5.Pt's weight >40kg  Yes.   6. Abnormal electrolyte(s):   K 3.2  7. Electrolytes replaced per protocol 8.  Call MD STAT for K+ </= 2.5, Phos </= 1, or Mag </= 1 Physician:  V. Ella Jubilee 12/10/2021 5:36 AM

## 2021-12-10 NOTE — Evaluation (Signed)
Occupational Therapy Evaluation Patient Details Name: Gina Cherry MRN: [id number] DOB: [birth year] Today's Date: 12/10/2021   History of Present Illness Pt is 66 yo female who presents with 5 day h/o cough, SOB, diarrhea, and weakness that resulted in a fall with head lacerations, CT (-) for ICH. Pt found to have sepsis due to RLL PNA. Pt with worsening respiratory status on 2/15 and VDRF.  PMH: smoker, EtOH abuse.  Extubated 2/25.   Clinical Impression   Patient extubated on HFNC and saturating in the high 90's.  Patient with good participation and able to transition out of bed and to the recliner this date.  Deficits impacting independence are listed below.  OT to continue efforts with AIR recommended for post acute rehab prior to returning home.        Recommendations for follow up therapy are one component of a multi-disciplinary discharge planning process, led by the attending physician.  Recommendations may be updated based on patient status, additional functional criteria and insurance authorization.   Follow Up Recommendations  Acute inpatient rehab (3hours/day)    Assistance Recommended at Discharge Frequent or constant Supervision/Assistance  Patient can return home with the following A lot of help with walking and/or transfers;A lot of help with bathing/dressing/bathroom;Assistance with cooking/housework;Direct supervision/assist for medications management;Direct supervision/assist for financial management;Assist for transportation;Help with stairs or ramp for entrance    Functional Status Assessment  Patient has had a recent decline in their functional status and demonstrates the ability to make significant improvements in function in a reasonable and predictable amount of time.  Equipment Recommendations  BSC/3in1;Tub/shower bench    Recommendations for Other Services       Precautions / Restrictions Precautions Precautions: Fall Precaution Comments:  NPO Restrictions Weight Bearing Restrictions: No Other Position/Activity Restrictions: watch sats and HR      Mobility Bed Mobility Overal bed mobility: Needs Assistance Bed Mobility: Supine to Sit     Supine to sit: Mod assist, HOB elevated       Patient Response: Cooperative  Transfers Overall transfer level: Needs assistance   Transfers: Sit to/from Stand, Bed to chair/wheelchair/BSC Sit to Stand: Max assist     Step pivot transfers: Mod assist, Max assist            Balance Overall balance assessment: Needs assistance Sitting-balance support: Feet supported Sitting balance-Leahy Scale: Fair     Standing balance support: Bilateral upper extremity supported, Reliant on assistive device for balance Standing balance-Leahy Scale: Poor                             ADL either performed or assessed with clinical judgement   ADL   Eating/Feeding: NPO   Grooming: Set up;Sitting   Upper Body Bathing: Minimal assistance;Sitting   Lower Body Bathing: Moderate assistance;Sitting/lateral leans   Upper Body Dressing : Minimal assistance;Sitting   Lower Body Dressing: Moderate assistance;Sitting/lateral leans   Toilet Transfer: Maximal assistance;Stand-pivot                   Vision Baseline Vision/History: 1 Wears glasses Patient Visual Report: No change from baseline       Perception Perception Perception: Within Functional Limits   Praxis Praxis Praxis: Intact    Pertinent Vitals/Pain Pain Assessment Pain Assessment: No/denies pain     Hand Dominance Right   Extremity/Trunk Assessment Upper Extremity Assessment Upper Extremity Assessment: Generalized weakness   Lower Extremity Assessment Lower Extremity Assessment: Defer to  PT evaluation   Cervical / Trunk Assessment Cervical / Trunk Assessment: Normal   Communication Communication Communication: Expressive difficulties   Cognition Arousal/Alertness:  Awake/alert Behavior During Therapy: WFL for tasks assessed/performed Overall Cognitive Status: Within Functional Limits for tasks assessed                                       General Comments   VSS on HFNC    Exercises     Shoulder Instructions      Home Living Family/patient expects to be discharged to:: Private residence Living Arrangements: Spouse/significant other Available Help at Discharge: Family;Available 24 hours/day Type of Home: House Home Access: Stairs to enter [employer] of Steps: 3 Entrance Stairs-Rails: Left Home Layout: One level     Bathroom Shower/Tub: Tub/shower unit;Door   [employer] Toilet: Standard     Home Equipment: None          Prior Functioning/Environment Prior Level of Function : Independent/Modified Independent;Driving                        OT Problem List: Decreased strength;Decreased activity tolerance;Impaired balance (sitting and/or standing);Decreased safety awareness;Decreased cognition;Decreased knowledge of use of DME or AE;Cardiopulmonary status limiting activity      OT Treatment/Interventions: Self-care/ADL training;Therapeutic exercise;Neuromuscular education;Energy conservation;DME and/or AE instruction;Therapeutic activities;Cognitive remediation/compensation;Patient/family education;Balance training    OT Goals(Current goals can be found in the care plan section) Acute Rehab OT Goals OT Goal Formulation: With patient Time For Goal Achievement: 12/23/21 Potential to Achieve Goals: Good  OT Frequency: Min 2X/week    Co-evaluation              AM-PAC OT "6 Clicks" Daily Activity     Outcome Measure Help from another person eating meals?: Total Help from another person taking care of personal grooming?: A Little Help from another person toileting, which includes using toliet, bedpan, or urinal?: A Lot Help from another person bathing (including washing, rinsing, drying)?: A  Lot Help from another person to put on and taking off regular upper body clothing?: A Little Help from another person to put on and taking off regular lower body clothing?: A Lot 6 Click Score: 13   End of Session Equipment Utilized During Treatment: Gait belt;Rolling walker (2 wheels);Oxygen Nurse Communication: Mobility status  Activity Tolerance: Patient tolerated treatment well Patient left: in chair;with call bell/phone within reach;with family/visitor present  OT Visit Diagnosis: Unsteadiness on feet (R26.81);Other abnormalities of gait and mobility (R26.89);Muscle weakness (generalized) (M62.81);History of falling (Z91.81);Other symptoms and signs involving the nervous system (R29.898);Other symptoms and signs involving cognitive function;Dizziness and giddiness (R42)                Time: 1250-1320 OT Time Calculation (min): 30 min Charges:  OT General Charges $OT Visit: 1 Visit OT Evaluation $OT Eval Moderate Complexity: 1 Mod OT Treatments $Self Care/Home Management : 8-22 mins  12/10/2021  RP, OTR/L  Acute Rehabilitation Services  Office:  [phone]   Suzanna Obey 12/10/2021, 1:50 PM

## 2021-12-11 DIAGNOSIS — I5181 Takotsubo syndrome: Secondary | ICD-10-CM | POA: Diagnosis not present

## 2021-12-11 DIAGNOSIS — R6521 Severe sepsis with septic shock: Secondary | ICD-10-CM | POA: Diagnosis not present

## 2021-12-11 DIAGNOSIS — A419 Sepsis, unspecified organism: Secondary | ICD-10-CM | POA: Diagnosis not present

## 2021-12-11 DIAGNOSIS — J9601 Acute respiratory failure with hypoxia: Secondary | ICD-10-CM | POA: Diagnosis not present

## 2021-12-11 LAB — CBC WITH DIFFERENTIAL/PLATELET
Abs Immature Granulocytes: 0.06 10*3/uL (ref 0.00–0.07)
Basophils Absolute: 0 10*3/uL (ref 0.0–0.1)
Basophils Relative: 0 %
Eosinophils Absolute: 0 10*3/uL (ref 0.0–0.5)
Eosinophils Relative: 0 %
HCT: 27.7 % — ABNORMAL LOW (ref 36.0–46.0)
Hemoglobin: 9.7 g/dL — ABNORMAL LOW (ref 12.0–15.0)
Immature Granulocytes: 1 %
Lymphocytes Relative: 13 %
Lymphs Abs: 1.4 10*3/uL (ref 0.7–4.0)
MCH: 36.2 pg — ABNORMAL HIGH (ref 26.0–34.0)
MCHC: 35 g/dL (ref 30.0–36.0)
MCV: 103.4 fL — ABNORMAL HIGH (ref 80.0–100.0)
Monocytes Absolute: 1.5 10*3/uL — ABNORMAL HIGH (ref 0.1–1.0)
Monocytes Relative: 14 %
Neutro Abs: 7.6 10*3/uL (ref 1.7–7.7)
Neutrophils Relative %: 72 %
Platelets: 436 10*3/uL — ABNORMAL HIGH (ref 150–400)
RBC: 2.68 MIL/uL — ABNORMAL LOW (ref 3.87–5.11)
RDW: 13.4 % (ref 11.5–15.5)
WBC: 10.7 10*3/uL — ABNORMAL HIGH (ref 4.0–10.5)
nRBC: 0 % (ref 0.0–0.2)

## 2021-12-11 LAB — GLUCOSE, CAPILLARY
Glucose-Capillary: 103 mg/dL — ABNORMAL HIGH (ref 70–99)
Glucose-Capillary: 118 mg/dL — ABNORMAL HIGH (ref 70–99)
Glucose-Capillary: 139 mg/dL — ABNORMAL HIGH (ref 70–99)
Glucose-Capillary: 143 mg/dL — ABNORMAL HIGH (ref 70–99)
Glucose-Capillary: 162 mg/dL — ABNORMAL HIGH (ref 70–99)
Glucose-Capillary: 188 mg/dL — ABNORMAL HIGH (ref 70–99)

## 2021-12-11 LAB — BASIC METABOLIC PANEL
Anion gap: 9 (ref 5–15)
BUN: 19 mg/dL (ref 8–23)
CO2: 34 mmol/L — ABNORMAL HIGH (ref 22–32)
Calcium: 8.3 mg/dL — ABNORMAL LOW (ref 8.9–10.3)
Chloride: 97 mmol/L — ABNORMAL LOW (ref 98–111)
Creatinine, Ser: 0.41 mg/dL — ABNORMAL LOW (ref 0.44–1.00)
GFR, Estimated: >60 mL/min (ref >60)
Glucose, Bld: 130 mg/dL — ABNORMAL HIGH (ref 70–99)
Potassium: 3.3 mmol/L — ABNORMAL LOW (ref 3.5–5.1)
Sodium: 140 mmol/L (ref 135–145)

## 2021-12-11 MED ORDER — PREDNISONE 50 MG PO TABS
50.0000 mg | ORAL_TABLET | Freq: Every day | ORAL | Status: AC
  Administered 2021-12-11 – 2021-12-14 (×4): 50 mg
  Filled 2021-12-11: qty 3
  Filled 2021-12-11 (×2): qty 1

## 2021-12-11 MED ORDER — POTASSIUM CHLORIDE 10 MEQ/100ML IV SOLN
10.0000 meq | INTRAVENOUS | Status: AC
  Administered 2021-12-11 (×4): 10 meq via INTRAVENOUS
  Filled 2021-12-11 (×4): qty 100

## 2021-12-11 MED ORDER — ACETAZOLAMIDE 250 MG PO TABS
250.0000 mg | ORAL_TABLET | Freq: Two times a day (BID) | ORAL | Status: DC
  Administered 2021-12-11 (×2): 250 mg via ORAL
  Filled 2021-12-11 (×3): qty 1

## 2021-12-11 MED ORDER — ORAL CARE MOUTH RINSE
15.0000 mL | Freq: Two times a day (BID) | OROMUCOSAL | Status: DC
  Administered 2021-12-12 – 2021-12-17 (×12): 15 mL via OROMUCOSAL

## 2021-12-11 MED ORDER — PREDNISONE 20 MG PO TABS: 50.0000 mg | ORAL_TABLET | Freq: Every day | ORAL | Status: DC

## 2021-12-11 MED ORDER — CHLORHEXIDINE GLUCONATE 0.12 % MT SOLN
15.0000 mL | Freq: Two times a day (BID) | OROMUCOSAL | Status: DC
  Administered 2021-12-11 – 2021-12-17 (×13): 15 mL via OROMUCOSAL
  Filled 2021-12-11 (×13): qty 15

## 2021-12-11 MED ORDER — POTASSIUM CHLORIDE 20 MEQ PO PACK
20.0000 meq | PACK | ORAL | Status: AC
  Administered 2021-12-11 (×2): 20 meq
  Filled 2021-12-11 (×2): qty 1

## 2021-12-11 NOTE — Progress Notes (Signed)
Physical Therapy Treatment Patient Details Name: Gina Cherry MRN: [id number] DOB: Dec 20, 1955 Today's Date: 12/11/2021   History of Present Illness Pt is 66 yo female who presents with 5 day h/o cough, SOB, diarrhea, and weakness that resulted in a fall with head lacerations, CT (-) for ICH. Pt found to have sepsis due to RLL PNA. Pt with worsening respiratory status on 2/15 and VDRF.  PMH: smoker, EtOH abuse.  Extubated 2/25.    PT Comments    Pt remains limited by fatigue but is able to tolerate multiple transfers during session. Pt with persistent posterior lean, although improves some with verbal and tactile cues. Pt appears to have reduced recall of PT cues between transfer attempts, often needing repeated cues during session. Pt will benefit from continued aggressive mobilization in an effort to improve balance and reduce falls risk.   Recommendations for follow up therapy are one component of a multi-disciplinary discharge planning process, led by the attending physician.  Recommendations may be updated based on patient status, additional functional criteria and insurance authorization.  Follow Up Recommendations  Acute inpatient rehab (3hours/day)     Assistance Recommended at Discharge Frequent or constant Supervision/Assistance  Patient can return home with the following Two people to help with walking and/or transfers;Two people to help with bathing/dressing/bathroom;Assistance with cooking/housework;Direct supervision/assist for medications management;Direct supervision/assist for financial management;Assist for transportation;Help with stairs or ramp for entrance   Equipment Recommendations  Rolling walker (2 wheels);BSC/3in1    Recommendations for Other Services       Precautions / Restrictions Precautions Precautions: Fall Restrictions Weight Bearing Restrictions: No Other Position/Activity Restrictions: watch sats and HR     Mobility  Bed Mobility Overal bed  mobility: Needs Assistance Bed Mobility: Supine to Sit     Supine to sit: Min guard, HOB elevated          Transfers Overall transfer level: Needs assistance Equipment used: Rolling walker (2 wheels) Transfers: Sit to/from Stand, Bed to chair/wheelchair/BSC Sit to Stand: Min assist   Step pivot transfers: Mod assist       General transfer comment: posterior lean. PT providing cues to scoot to edge, increase trunk flexion, hand placement, nose over toes    Ambulation/Gait             Pre-gait activities: pt marching in place, reduced foot clearance bilaterally, posterior lean     Stairs             Wheelchair Mobility    Modified Rankin (Stroke Patients Only)       Balance Overall balance assessment: Needs assistance Sitting-balance support: No upper extremity supported, Feet supported Sitting balance-Leahy Scale: Fair     Standing balance support: Bilateral upper extremity supported, Reliant on assistive device for balance Standing balance-Leahy Scale: Poor Standing balance comment: posterior lean                            Cognition Arousal/Alertness: Awake/alert Behavior During Therapy: WFL for tasks assessed/performed Overall Cognitive Status: Impaired/Different from baseline Area of Impairment: Problem solving                             Problem Solving: Slow processing          Exercises      General Comments General comments (skin integrity, edema, etc.): pt on 6L HFNC during session, sats stable when pleth reading reliable. Pt does  fatigue quickly requiring multiple seated rest breaks during session      Pertinent Vitals/Pain Pain Assessment Pain Assessment: No/denies pain    Home Living                          Prior Function            PT Goals (current goals can now be found in the care plan section) Acute Rehab PT Goals Patient Stated Goal: return home Progress towards PT goals:  Progressing toward goals    Frequency    Min 3X/week      PT Plan Current plan remains appropriate    Co-evaluation              AM-PAC PT "6 Clicks" Mobility   Outcome Measure  Help needed turning from your back to your side while in a flat bed without using bedrails?: A Little Help needed moving from lying on your back to sitting on the side of a flat bed without using bedrails?: A Little Help needed moving to and from a bed to a chair (including a wheelchair)?: A Lot Help needed standing up from a chair using your arms (e.g., wheelchair or bedside chair)?: A Little Help needed to walk in hospital room?: Total Help needed climbing 3-5 steps with a railing? : Total 6 Click Score: 13    End of Session   Activity Tolerance: Patient tolerated treatment well Patient left: in bed;with call bell/phone within reach;with bed alarm set Nurse Communication: Mobility status PT Visit Diagnosis: Muscle weakness (generalized) (M62.81);Difficulty in walking, not elsewhere classified (R26.2)     Time: [id number] PT Time Calculation (min) (ACUTE ONLY): 29 min  Charges:  $Therapeutic Activity: 23-37 mins                     Zenaida Niece, PT, DPT Acute Rehabilitation Pager: [phone] Office [place] Beanca Kiester 12/11/2021, 3:50 PM

## 2021-12-11 NOTE — Progress Notes (Signed)
[facility] ADULT ICU REPLACEMENT PROTOCOL   The patient does apply for the [facility] Adult ICU Electrolyte Replacment Protocol based on the criteria listed below:   1.Exclusion criteria: TCTS patients, ECMO patients, and Dialysis patients 2. Is GFR >/= 30 ml/min? Yes.    Patient's GFR today is > 60 3. Is SCr </= 2? Yes.   Patient's SCr is 0.41 mg/dL 4. Did SCr increase >/= 0.5 in 24 hours? No. 5.Pt's weight >40kg  Yes.   6. Abnormal electrolyte(s): K+ 3.3  7. Electrolytes replaced per protocol 8.  Call MD STAT for K+ </= 2.5, Phos </= 1, or Mag </= 1 Physician:  Dr Morey Hummingbird, Jettie Booze 12/11/2021 2:54 AM

## 2021-12-11 NOTE — Progress Notes (Signed)
NAME:  Gina Cherry, MRN:  [id number], DOB:  [birth year], LOS: 12 ADMISSION DATE:  11/28/2021, CONSULTATION DATE:  12/11/21 REFERRING MD:  Lysbeth Galas, CHIEF COMPLAINT:   worsening hypoxia/dyspnea  History of Present Illness:  Gina Cherry is a 66 y.o. F with PMH of tobacco and ETOH use who presented with CAP ,bilateral pneumonia with WBC 13k and mildly elevated LFT' s along with hypokalemia.  She was admitted to the floor, intubated 2/15 for ARDS Course complicated by shock, stress cardiomyopathy  She smoking 1/2 ppd for 40+ years  Pertinent  Medical History  Tobacco and ETOH use   Significant Hospital Events: Including procedures, antibiotic start and stop dates in addition to other pertinent events   2/13 admitted to teaching service, later required HFNC and ICU trxfr, On Vanc/Ceftriaxone/Azithromycin 2/14 wbc ct up. Vancs stopped as MRSA PCR neg  ECHO Mderately reduced LV function with regional  wall motion abnormalities, 30-35%. There is hypokinesis of the basal to  mid segments of all walls of the LV. There is apical hyperkinesis. This pattern could represent an atypical  stress induced cardiomyopathy pattern as this spares the apex  (non-coronary distributions).  2/15 increased O2 needs. CXR worse. Mottled. Concern for shock. Vanc added back. Right IJ CVL placed. Right radial aline placed. Cards consulted. Intubated. BAL sent  2/16 remains intubated with high vent settings including FiO2 70% PEEP 10.  Lower extremities no longer mottled.  Proned for worsening ARDS 2/17 un-proned.  Started on levaquin, urine Legionella positive, down to 60%/ 10 peep, ongoing levophed 2/18 tmax 103, NE at 11 mcg, coox 88%, CVP 10, diuresed, down down 40%/ 8  Interim History / Subjective:   Successful extubation yesterday. Still not able to take in orals and voice remains very weak.   Objective   Blood pressure 136/69, pulse 98, temperature 99 F (37.2 C), temperature source Axillary, resp.  rate (!) 21, height _0  (1.626 m), weight 44.6 kg, SpO2 100 %.        Intake/Output Summary (Last 24 hours) at 12/11/2021 1459 Last data filed at 12/11/2021 0600 Gross per 24 hour  Intake 848.97 ml  Output 2050 ml  Net -1201.03 ml    Filed Weights   12/09/21 0408 12/10/21 0500 12/11/21 0500  Weight: 50.9 kg 46.2 kg 44.6 kg   Physical Exam:  General:  Sitting in bed. Calm on HFNC HEENT: Dry and chapped mucosae. Cortrak in place.  Neuro:  awake and interactive still week with poor balance.  CV:  ST, no murmur. Extremities warm, JVP at 4cm  PULM:  chest clear bilaterally.  GI:  soft, active bowel sounds, Purwick in place.  Extremities: warm/dry, 2+ pulse bilaterally, edema in arms much improved. Marked muscle atrophy    Ancillary tests personally reviewed:   Hypokalemia 3.2 CO2 33 BNP 695  Assessment & Plan:  Principal Problem:   CAP (community acquired pneumonia) Active Problems:   Septic shock (HCC)   Weakness   Acute respiratory failure (HCC)   Elevated LFTs   Tobacco abuse   Risk for falls   Protein-calorie malnutrition, severe   Takotsubo cardiomyopathy   Malnutrition of moderate degree   Pressure injury of skin  Plan:  - Wean O2 to keep saturations >88% - Short steroid course - Continue current COPD regimen and convert to MDI closer to discharge.  - Progressive ambulation.  - Continue tube feeds. Swallow evaluation and FEES tomorrow. May need appetite stimulant. Suspect combination of alcoholism and COPD accounts for cachexia.  -  Continue to diurese, continue with diamox.  - Should be ready for transfer to floor tomorrow.   Best Practice (right click and "Reselect all SmartList Selections" daily)   Diet/type: tubefeeds - transition to oral intake.  DVT prophylaxis: LMWH GI prophylaxis: PPI Lines: Central line and Arterial Line (R IJ and left radial) Foley:  Yes, and it is still needed Code Status:  full code Last date of multidisciplinary goals of  care discussion [Discussed intubation with patient and SO, would want full code]  Husband updated at bedside 2/26   Kipp Brood, MD [facility] ICU Physician [place]  Pager: [phone] Or Epic Secure Chat After hours: [phone].  12/11/2021, 3:08 PM

## 2021-12-11 NOTE — Evaluation (Signed)
Clinical/Bedside Swallow Evaluation Patient Details  Name: WANDA RIDEOUT MRN: [id number] Date of Birth: 1956/01/07  Today's Date: 12/11/2021 Time: SLP Start Time (ACUTE ONLY): 8592 SLP Stop Time (ACUTE ONLY): 1015 SLP Time Calculation (min) (ACUTE ONLY): 20 min  Past Medical History: History reviewed. No pertinent past medical history. Past Surgical History: History reviewed. No pertinent surgical history. HPI:  Pt is 66 yo female who presents with 5 day h/o cough, SOB, diarrhea, and weakness that resulted in a fall with head lacerations, CT (-) for ICH. Pt found to have sepsis due to RLL PNA. Pt with worsening respiratory status on 2/15 and VDRF, intubated.  PMH: smoker, EtOH abuse.  Extubated 2/25.    Assessment / Plan / Recommendation  Clinical Impression  Patient presents with clinical s/s of a reversable post-extubation dysphagia. She was extubated 2/25 after 11 days being intubated. Patient denied having any hoarseness or weakness to voice prior to intubation. When SLP arrived, patient awake and alert, sitting up in bed. Lips were dry and oral mucosa was slightly dry but patient managing secretions by expectorating as well as some use of oral suction. Patient's voice was low in intensity and moderately hoarse. Patient denied any pain in throat. SLP observed patient consume ice chips and water sips (via spoon and then cup). She did not exhibit any overt s/s aspiration or penetration with ice chips and although she did have some delayed throat clearing with thin liquids (water) via cup sips, suspect this is largely due to pharyngeal secretions. As patient had been intubated for prolonged time and she continues with moderately hoarse voice, SLP recommending to continue NPO status but allow patient to have plain, thin water and/or ice chips throughout the day with intermittent supervision. SLP will f/u next date with likely plan for objective swallow study (FEES versus MBS). SLP Visit  Diagnosis: Dysphagia, unspecified (R13.10)    Aspiration Risk  Mild aspiration risk;Moderate aspiration risk    Diet Recommendation NPO;Ice chips PRN after oral care;Other (Comment) (ice chips, water sips PRN throughout the day)   Liquid Administration via: Cup Medication Administration: Via alternative means Supervision: Patient able to self feed;Intermittent supervision to cue for compensatory strategies Compensations: Slow rate;Small sips/bites Postural Changes: Seated upright at 90 degrees    Other  Recommendations Oral Care Recommendations: Oral care BID    Recommendations for follow up therapy are one component of a multi-disciplinary discharge planning process, led by the attending physician.  Recommendations may be updated based on patient status, additional functional criteria and insurance authorization.  Follow up Recommendations Other (comment) (pending progress)      Assistance Recommended at Discharge Intermittent Supervision/Assistance  Functional Status Assessment Patient has had a recent decline in their functional status and demonstrates the ability to make significant improvements in function in a reasonable and predictable amount of time.  Frequency and Duration min 2x/week  1 week       Prognosis Prognosis for Safe Diet Advancement: Good      Swallow Study   General Date of Onset: 11/28/21 HPI: Pt is 66 yo female who presents with 5 day h/o cough, SOB, diarrhea, and weakness that resulted in a fall with head lacerations, CT (-) for ICH. Pt found to have sepsis due to RLL PNA. Pt with worsening respiratory status on 2/15 and VDRF, intubated.  PMH: smoker, EtOH abuse.  Extubated 2/25. Type of Study: Bedside Swallow Evaluation Previous Swallow Assessment: none found Diet Prior to this Study: NPO Temperature Spikes Noted: No Respiratory  Status: Nasal cannula History of Recent Intubation: Yes Length of Intubations (days): 11 days Date extubated:  12/10/21 Behavior/Cognition: Alert;Cooperative;Pleasant mood Oral Cavity Assessment: Dry Oral Care Completed by SLP: Other (Comment) (patient completed) Oral Cavity - Dentition: Adequate natural dentition Vision: Functional for self-feeding Self-Feeding Abilities: Able to feed self Patient Positioning: Upright in bed Baseline Vocal Quality: Hoarse;Low vocal intensity Volitional Cough: Weak Volitional Swallow: Able to elicit    Oral/Motor/Sensory Function Overall Oral Motor/Sensory Function: Within functional limits   Ice Chips Ice chips: Within functional limits Presentation: Spoon Oral Phase Functional Implications: Other (comment) (no observed oral phase deficits) Pharyngeal Phase Impairments: Other (comments) (no observed pharyngeal phase deficits)   Thin Liquid Thin Liquid: Impaired Presentation: Cup;Self Fed Pharyngeal  Phase Impairments: Throat Clearing - Delayed    Nectar Thick Nectar Thick Liquid: Not tested   Honey Thick Honey Thick Liquid: Not tested   Puree Puree: Not tested   Solid     Solid: Not tested      Angela Nevin, MA, CCC-SLP Speech Therapy

## 2021-12-12 ENCOUNTER — Inpatient Hospital Stay (HOSPITAL_COMMUNITY): Payer: Medicare HMO

## 2021-12-12 DIAGNOSIS — R051 Acute cough: Secondary | ICD-10-CM | POA: Diagnosis not present

## 2021-12-12 DIAGNOSIS — J9601 Acute respiratory failure with hypoxia: Secondary | ICD-10-CM | POA: Diagnosis not present

## 2021-12-12 DIAGNOSIS — R6521 Severe sepsis with septic shock: Secondary | ICD-10-CM | POA: Diagnosis not present

## 2021-12-12 DIAGNOSIS — J189 Pneumonia, unspecified organism: Secondary | ICD-10-CM | POA: Diagnosis not present

## 2021-12-12 DIAGNOSIS — I5181 Takotsubo syndrome: Secondary | ICD-10-CM | POA: Diagnosis not present

## 2021-12-12 DIAGNOSIS — A419 Sepsis, unspecified organism: Secondary | ICD-10-CM | POA: Diagnosis not present

## 2021-12-12 LAB — CBC WITH DIFFERENTIAL/PLATELET
Abs Immature Granulocytes: 0.08 10*3/uL — ABNORMAL HIGH (ref 0.00–0.07)
Basophils Absolute: 0 10*3/uL (ref 0.0–0.1)
Basophils Relative: 0 %
Eosinophils Absolute: 0 10*3/uL (ref 0.0–0.5)
Eosinophils Relative: 0 %
HCT: 28.5 % — ABNORMAL LOW (ref 36.0–46.0)
Hemoglobin: 9.5 g/dL — ABNORMAL LOW (ref 12.0–15.0)
Immature Granulocytes: 1 %
Lymphocytes Relative: 13 %
Lymphs Abs: 1.4 10*3/uL (ref 0.7–4.0)
MCH: 34.9 pg — ABNORMAL HIGH (ref 26.0–34.0)
MCHC: 33.3 g/dL (ref 30.0–36.0)
MCV: 104.8 fL — ABNORMAL HIGH (ref 80.0–100.0)
Monocytes Absolute: 1.8 10*3/uL — ABNORMAL HIGH (ref 0.1–1.0)
Monocytes Relative: 16 %
Neutro Abs: 7.9 10*3/uL — ABNORMAL HIGH (ref 1.7–7.7)
Neutrophils Relative %: 70 %
Platelets: 429 10*3/uL — ABNORMAL HIGH (ref 150–400)
RBC: 2.72 MIL/uL — ABNORMAL LOW (ref 3.87–5.11)
RDW: 13.5 % (ref 11.5–15.5)
WBC: 11.3 10*3/uL — ABNORMAL HIGH (ref 4.0–10.5)
nRBC: 0 % (ref 0.0–0.2)

## 2021-12-12 LAB — BASIC METABOLIC PANEL
Anion gap: 9 (ref 5–15)
BUN: 17 mg/dL (ref 8–23)
CO2: 30 mmol/L (ref 22–32)
Calcium: 8.7 mg/dL — ABNORMAL LOW (ref 8.9–10.3)
Chloride: 98 mmol/L (ref 98–111)
Creatinine, Ser: 0.43 mg/dL — ABNORMAL LOW (ref 0.44–1.00)
GFR, Estimated: >60 mL/min (ref >60)
Glucose, Bld: 146 mg/dL — ABNORMAL HIGH (ref 70–99)
Potassium: 3.4 mmol/L — ABNORMAL LOW (ref 3.5–5.1)
Sodium: 137 mmol/L (ref 135–145)

## 2021-12-12 LAB — GLUCOSE, CAPILLARY
Glucose-Capillary: 148 mg/dL — ABNORMAL HIGH (ref 70–99)
Glucose-Capillary: 139 mg/dL — ABNORMAL HIGH (ref 70–99)
Glucose-Capillary: 153 mg/dL — ABNORMAL HIGH (ref 70–99)
Glucose-Capillary: 161 mg/dL — ABNORMAL HIGH (ref 70–99)
Glucose-Capillary: 108 mg/dL — ABNORMAL HIGH (ref 70–99)
Glucose-Capillary: 186 mg/dL — ABNORMAL HIGH (ref 70–99)

## 2021-12-12 MED ORDER — OSMOLITE 1.5 CAL PO LIQD
1000.0000 mL | ORAL | Status: DC
  Administered 2021-12-12 – 2021-12-14 (×3): 1000 mL

## 2021-12-12 MED ORDER — NEPRO/CARBSTEADY PO LIQD
237.0000 mL | Freq: Two times a day (BID) | ORAL | Status: DC
Start: 2021-12-12 — End: 2021-12-15
  Administered 2021-12-13 – 2021-12-14 (×3): 237 mL via ORAL

## 2021-12-12 MED ORDER — FLUTICASONE FUROATE-VILANTEROL 200-25 MCG/ACT IN AEPB
1.0000 | INHALATION_SPRAY | Freq: Every day | RESPIRATORY_TRACT | Status: DC
  Administered 2021-12-12 – 2021-12-18 (×7): 1 via RESPIRATORY_TRACT
  Filled 2021-12-12: qty 28

## 2021-12-12 MED ORDER — UMECLIDINIUM BROMIDE 62.5 MCG/ACT IN AEPB
1.0000 | INHALATION_SPRAY | Freq: Every day | RESPIRATORY_TRACT | Status: DC
  Administered 2021-12-12 – 2021-12-18 (×7): 1 via RESPIRATORY_TRACT
  Filled 2021-12-12: qty 7

## 2021-12-12 MED ORDER — ENOXAPARIN SODIUM 30 MG/0.3ML IJ SOSY
30.0000 mg | PREFILLED_SYRINGE | INTRAMUSCULAR | Status: DC
  Administered 2021-12-13 – 2021-12-18 (×6): 30 mg via SUBCUTANEOUS
  Filled 2021-12-12 (×6): qty 0.3

## 2021-12-12 MED ORDER — POTASSIUM CHLORIDE 20 MEQ PO PACK
40.0000 meq | PACK | Freq: Once | ORAL | Status: AC
  Administered 2021-12-12: 40 meq
  Filled 2021-12-12: qty 2

## 2021-12-12 MED ORDER — SPIRONOLACTONE 12.5 MG HALF TABLET
12.5000 mg | ORAL_TABLET | Freq: Every day | ORAL | Status: DC
  Administered 2021-12-12 – 2021-12-18 (×7): 12.5 mg via ORAL
  Filled 2021-12-12 (×8): qty 1

## 2021-12-12 NOTE — Progress Notes (Signed)
Modified Barium Swallow Progress Note  Patient Details  Name: Gina Cherry MRN: [id number] Date of Birth: April 15, 1956  Today's Date: 12/12/2021  Modified Barium Swallow completed.  Full report located under Chart Review in the Imaging Section.  Brief recommendations include the following:  Clinical Impression  Pt demosntrates a mild dysphagia following prolonged intubation with suspected changes to glottic competence given dysphonia and aspiration during and after the swallow with delayed, insufficient cough response. Pts function also complicated by presence of cortrak which appears to contribute to pyriform sinus residue and post swallow penetration/aspiration events with thin liquids. Though pt has trace aspiration of thin via cup and straw with late sensation, she tolerated nectar and solids without significant residue. Recommend nectar thick liquids, regular solids, pills whole with nectar or puree and consider d/c cortrak when pt has tolerated a few meals with adequate intake. Will consider upgrade to thins when vocal quality is clearer. Recommend CIR.    Swallow Evaluation Recommendations       SLP Diet Recommendations: Regular solids;Nectar thick liquid   Liquid Administration via: Cup;Straw   Medication Administration: Whole meds with liquid   Supervision: Patient able to self feed   Compensations: Slow rate;Small sips/bites   Postural Changes: Remain semi-upright after after feeds/meals (Comment)   Oral Care Recommendations: Oral care BID      Harlon Ditty, MA CCC-SLP  Acute Rehabilitation Services Pager [phone] Office [phone]   Claudine Mouton 12/12/2021,10:02 AM

## 2021-12-12 NOTE — Progress Notes (Addendum)
Advanced Heart Failure Rounding Note  PCP-Cardiologist: None   Subjective:   12/20: Repeat Echo  EF 65-70% no RWMA 2/25 Extubated    Overt the weekend diuresed with IV lasix + diamox. Overall weight down 31 pounds.   Denies chest pain.    Objective:   Weight Range: 41.8 kg Body mass index is 15.82 kg/m.   Vital Signs:   Temp:  [98 F (36.7 C)-99 F (37.2 C)] 98 F (36.7 C) (02/27 0800) Pulse Rate:  [73-105] 102 (02/27 0816) Resp:  [15-29] 19 (02/27 0816) BP: (97-136)/(45-99) 120/70 (02/27 0816) SpO2:  [81 %-100 %] 97 % (02/27 0816) Weight:  [41.8 kg] 41.8 kg (02/27 0500) Last BM Date : 12/12/21  Weight change: Filed Weights   12/10/21 0500 12/11/21 0500 12/12/21 0500  Weight: 46.2 kg 44.6 kg 41.8 kg    Intake/Output:   Intake/Output Summary (Last 24 hours) at 12/12/2021 0947 Last data filed at 12/12/2021 0800 Gross per 24 hour  Intake 1540 ml  Output 2600 ml  Net -1060 ml      Physical Exam  General:  No resp difficulty HEENT: normal. + Cortrak . Lower lip with scabs Neck: supple. no JVD. Carotids 2+ bilat; no bruits. No lymphadenopathy or thryomegaly appreciated. Cor: PMI nondisplaced. Regular rate & rhythm. No rubs, gallops or murmurs. Lungs: clear Abdomen: soft, nontender, nondistended. No hepatosplenomegaly. No bruits or masses. Good bowel sounds. Extremities: no cyanosis, clubbing, rash, edema Neuro: alert & orientedx3, cranial nerves grossly intact. moves all 4 extremities w/o difficulty. Affect pleasant   Telemetry  SR 80-90s   EKG    No EKG to review   Labs    CBC Recent Labs    12/11/21 0139 12/12/21 0450  WBC 10.7* 11.3*  NEUTROABS 7.6 7.9*  HGB 9.7* 9.5*  HCT 27.7* 28.5*  MCV 103.4* 104.8*  PLT 436* 429*   Basic Metabolic Panel Recent Labs    76/72/09 0139 12/12/21 0450  NA 140 137  K 3.3* 3.4*  CL 97* 98  CO2 34* 30  GLUCOSE 130* 146*  BUN 19 17  CREATININE 0.41* 0.43*  CALCIUM 8.3* 8.7*   Liver Function  Tests No results for input(s): AST, ALT, ALKPHOS, BILITOT, PROT, ALBUMIN in the last 72 hours.  No results for input(s): LIPASE, AMYLASE in the last 72 hours. Cardiac Enzymes No results for input(s): CKTOTAL, CKMB, CKMBINDEX, TROPONINI in the last 72 hours.   BNP: BNP (last 3 results) Recent Labs    11/30/21 0828 12/10/21 0421  BNP 1,181.2* 694.9*    ProBNP (last 3 results) No results for input(s): PROBNP in the last 8760 hours.   D-Dimer No results for input(s): DDIMER in the last 72 hours. Hemoglobin A1C No results for input(s): HGBA1C in the last 72 hours.  Fasting Lipid Panel Recent Labs    12/10/21 0421  TRIG 100   Thyroid Function Tests No results for input(s): TSH, T4TOTAL, T3FREE, THYROIDAB in the last 72 hours.  Invalid input(s): FREET3  Other results:   Imaging    No results found.   Medications:     Scheduled Medications:  acetaZOLAMIDE  250 mg Oral BID   aspirin  81 mg Per Tube Daily   bethanechol  10 mg Per Tube TID   chlorhexidine  15 mL Mouth Rinse BID   Chlorhexidine Gluconate Cloth  6 each Topical Q0600   docusate  100 mg Per Tube BID   enoxaparin (LOVENOX) injection  40 mg Subcutaneous Q24H  guaiFENesin  400 mg Per Tube Q6H   insulin aspart  0-9 Units Subcutaneous Q4H   insulin glargine-yfgn  5 Units Subcutaneous Daily   ipratropium-albuterol  3 mL Nebulization TID   mouth rinse  15 mL Mouth Rinse q12n4p   metoprolol tartrate  25 mg Per Tube BID   nicotine  21 mg Transdermal Daily   predniSONE  50 mg Per Tube Q breakfast    Infusions:  sodium chloride 250 mL (12/10/21 0839)   feeding supplement (OSMOLITE 1.5 CAL) 50 mL/hr at 12/11/21 1600    PRN Medications: acetaminophen, bisacodyl, ipratropium-albuterol, oxyCODONE-acetaminophen, white petrolatum    Patient Profile   66 y/o female w/ h/o tobacco and ETOH abuse (drinks ~4 glasses of wine/day), no prior cardiac history. Admitted w/ sepsis PNA and found to have reduced LV  dysfunction on echo. EF 40-45% w/ WMA pattern c/w possible atypical stressed induced CM.    Assessment/Plan   1. Shock  - lactic acidosis. Resolved.  - primarily septic shock in the setting of b/l PNA  - Stable off pressors.  - c/w abx per PCCM    2. Legionella PNA - abx per PCCM - Blood cultures No growth .  - Resp culture - no organisms, rare candida - On levaquin.    3. Acute Systolic Heart Failure  - Echo 2/14 EF read 30-35% (feel more like 40-45%), RV normal, WMA pattern c/w stress induced CM - Repeat Echo 2/20: EF 65-70% no RWMA supporting stress induced CM, now recovered  - Volume status stable.   - Diuresed with IV lasix. Stop IV lasix.  - Stop diamox. - Add 12.5 mg spiro daily.   4. Sinus tachycardia -  Likely d/t acute illness - Rate better today. Continue  metoprolol tartrate 25 mg BID.    5. Acute Hypoxic Respiratory Failure - b/l PNA  - intubated ---> extubated 2/25. Now on 5 liters [place].  - management per PCCM    6. Elevated LFTs - Suspect 2/2 shock + ETOH related  - LFTs continue to trend down on last CMET 12/07/22   7. Hypokalemia/ Hypomagnesemia  Supp K   8. Tobacco/ ETOH Abuse - cessation advised   7. Elevated HS trop  - Hs trop [id number] - suspect demand ischemia  - EF recovered  - Continue 81 mg aspirin - Could consider cath after she recovers. Discussed with husband.    Length of Stay: 13  Tonye Becket, NP  12/12/2021, 9:47 AM  Advanced Heart Failure Team Pager [phone] (M-F; 7a - 5p)  Please contact [facility] for night-coverage after hours (5p -7a ) and weekends on [email or web address]  Patient seen and examined with the above-signed Advanced Practice Provider and/or Housestaff. I personally reviewed laboratory data, imaging studies and relevant notes. I independently examined the patient and formulated the important aspects of the plan. I have edited the note to reflect any of my changes or salient points. I have personally discussed the plan  with the patient and/or family.  She has been diuresing well. Breathing better. No CP.   General:  Weak appearing. No resp difficulty HEENT: normal + cor-trak + sores on lip Neck: supple. no JVD. Carotids 2+ bilat; no bruits. No lymphadenopathy or thryomegaly appreciated. Cor: PMI nondisplaced. Regular rate & rhythm. No rubs, gallops or murmurs. Lungs: coarse  Abdomen: soft, nontender, nondistended. No hepatosplenomegaly. No bruits or masses. Good bowel sounds. Extremities: no cyanosis, clubbing, rash, edema Neuro: alert & orientedx3, cranial nerves grossly intact. moves all 4 extremities  w/o difficulty. Affect pleasant  We discussed her transient LV dysfunction and mildly elevated troponin levels in the setting of severe physiologic stress. Potential etiology is stress-induced CM vs transient ischemia with underlying CAD in the setting of physiologic stress.   Given her tobacco use she is at risk for CAD but denies antecedent CP or other ischemic symptoms.   She has decided to defer further w/u (I.e cath) for now. I will see her in the clinic as an outpatient and reassess.   HF team will sign off.   Arvilla Meres, MD  2:48 PM

## 2021-12-12 NOTE — Progress Notes (Signed)
[facility] ADULT ICU REPLACEMENT PROTOCOL   The patient does apply for the [facility] Adult ICU Electrolyte Replacment Protocol based on the criteria listed below:   1.Exclusion criteria: TCTS patients, ECMO patients, and Dialysis patients 2. Is GFR >/= 30 ml/min? Yes.    Patient's GFR today is > 60 3. Is SCr </= 2? Yes.   Patient's SCr is 0.43 mg/dL 4. Did SCr increase >/= 0.5 in 24 hours? No. 5.Pt's weight >40kg  Yes.   6. Abnormal electrolyte(s): K+ 3.4  7. Electrolytes replaced per protocol 8.  Call MD STAT for K+ </= 2.5, Phos </= 1, or Mag </= 1 Physician:  Dr Joyice Faster, Jettie Booze 12/12/2021 5:45 AM

## 2021-12-12 NOTE — Progress Notes (Addendum)
Nutrition Follow-up  DOCUMENTATION CODES:  Underweight, Non-severe (moderate) malnutrition in context of social or environmental circumstances  INTERVENTION:  Continue current diet as ordered per SLP, encourage PO intake Nepro Shake po BID, each supplement provides 425 kcal and 19 grams protein Nectar Thick Might shake with meals, this will provide 330kcal and 9g of protein per carton Modify TF regimen to cyclic feeds of Osmolite 1.5 at 6mL x15 hours (8PM-11AM).  Standard free water flush of 89mL q4h Prosource TF 1x/d to provide 40kcal and 11g of protein This will provide 981mL total volume, 1503kcal, 72g of protein, 927mL free water (TF+flush)   NUTRITION DIAGNOSIS:  Moderate Malnutrition (in the context of social/environmental circumstances) related to  (prolonged inadequate energy intake) as evidenced by mild fat depletion, moderate fat depletion, severe muscle depletion. - remains applicable  GOAL:  Patient will meet greater than or equal to 90% of their needs - being addressed with TF, diet advanced, supplements in place  MONITOR:  Diet advancement, I & O's, TF tolerance  REASON FOR ASSESSMENT:  Consult Enteral/tube feeding initiation and management  ASSESSMENT:  Pt with no known PMH except long history of tobacco use, currently smoking 1/2 pack per day, ETOH use (4 glasses of wine/day) admitted after fall at home with sepsis secondary to RLL PNA.  2/15 intubated 2/16 prone; TF initiated 2/17 - placed supine 2/22 - Cortrak tube placed 2/25 - extubated 2/27 - MBS, diet advanced to regular with nectar  SLP evaluated 2/26 and felt pt should remain NPO until after objective study could be performed. Able to be advanced to regular diet with nectar thick liquids this AM after MBS.  Pt resting in bed at the time of assessment, significant other at bedside. Cortrak tube remains in place. Discussed diet advancement and appetite with patient. Patient reports that she is not  feeling very hungry right now. In agreement with the fact that it is unlikely she will be able to support her nutrition needs orally today. Discussed modifying TF regimen to cyclic to continue to meet nutrition needs, but also allow a chance to feel hunger and promote oral intake. Pt in agreement with this plan and does not want the tube removed prematurely and then have to have it replaced.   Discussed nutrition supplements, pt agreeable to Nepro while on her nectar thick liquids. Will also add supplements to meal tray.   Discussed nutrition plan with MD and RN.   Intake/Output Summary (Last 24 hours) at 12/12/2021 1331 Last data filed at 12/12/2021 1200 Gross per 24 hour  Intake 1540 ml  Output 2000 ml  Net -460 ml  Net IO Since Admission: 8,611.8 mL [12/12/21 1331]  Nutritionally Relevant Medications: Scheduled Meds:  docusate  100 mg Per Tube BID   insulin aspart  0-9 Units Subcutaneous Q4H   insulin glargine-yfgn  5 Units Subcutaneous Daily   predniSONE  50 mg Per Tube Q breakfast   Continuous Infusions:  sodium chloride 250 mL (12/10/21 0839)   OSMOLITE 1.5 CAL 50 mL/hr at 12/11/21 1600   PRN Meds: bisacodyl  Labs Reviewed: Potassium 3.4 Creatinine .43  SBG ranges from 118-188 mg/dL over the last 24 hours  NUTRITION - FOCUSED PHYSICAL EXAM: Flowsheet Row Most Recent Value  Orbital Region Mild depletion  Upper Arm Region Moderate depletion  Thoracic and Lumbar Region Moderate depletion  Buccal Region Mild depletion  Temple Region Moderate depletion  Clavicle Bone Region Moderate depletion  Clavicle and Acromion Bone Region Severe depletion  Scapular Bone  Region Severe depletion  Dorsal Hand Mild depletion  Patellar Region Severe depletion  Anterior Thigh Region Severe depletion  Posterior Calf Region Severe depletion  Edema (RD Assessment) None  Hair Reviewed  Eyes Reviewed  Mouth Reviewed  Skin Reviewed  Nails Reviewed   Diet Order:   Diet Order              Diet regular Room service appropriate? Yes; Fluid consistency: Nectar Thick  Diet effective now                   EDUCATION NEEDS:  Education needs have been addressed  Skin:  Skin Assessment: Reviewed RN Assessment (Ecchymosis to the bilateral legs, laceration to the head, pressure injury stage 1 (sacrum))  Last BM:  2/27  Height:  Ht Readings from Last 1 Encounters:  11/30/21 5\' 4"  (1.626 m)   Weight:  Wt Readings from Last 1 Encounters:  12/12/21 41.8 kg    Ideal Body Weight:  54.5 kg  BMI:  Body mass index is 15.82 kg/m.  Estimated Nutritional Needs:  Kcal:  1500-1700 kcal/d Protein:  75-85 g/d Fluid:  1.5-1.7 L/d   Ranell Patrick, RD, LDN Clinical Dietitian RD pager # available in [facility]  After hours/weekend pager # available in [facility]

## 2021-12-12 NOTE — Progress Notes (Signed)
Inpatient Rehab Admissions Coordinator:  ? ?Per therapy recommendations,  patient was screened for CIR candidacy by Jenaya Saar, MS, CCC-SLP. At this time, Pt. Appears to be a a potential candidate for CIR. I will place   order for rehab consult per protocol for full assessment. Please contact me any with questions. ? ?Jya Hughston, MS, CCC-SLP ?Rehab Admissions Coordinator  ?[phone] (celll) ?[phone] (office) ? ?

## 2021-12-12 NOTE — Progress Notes (Incomplete)
Transfer Note  Gina Cherry is a 66 year old female who initially presented on 2/13 for shortness of breath.  She subsequently required high flow nasal cannula and ICU transfer.  She had worsening respiratory status requiring mechanical intubation and pressor support.  She was found to have ARDS and Legionella pneumonia treated with Levaquin.  She was successfully extubated on 2/25.

## 2021-12-12 NOTE — Progress Notes (Signed)
NAME:  Gina Cherry, MRN:  [id number], DOB:  05/11/1956, LOS: 52 ADMISSION DATE:  11/28/2021, CONSULTATION DATE:  12/12/21 REFERRING MD:  Lysbeth Galas, CHIEF COMPLAINT:   worsening hypoxia/dyspnea  History of Present Illness:  Gina Cherry is a 66 y.o. F with PMH of tobacco and ETOH use who presented with CAP ,bilateral pneumonia with WBC 13k and mildly elevated LFT' s along with hypokalemia.  She was admitted to the floor, intubated 2/15 for ARDS Course complicated by shock, stress cardiomyopathy  She smoking 1/2 ppd for 40+ years  Pertinent  Medical History  Tobacco and ETOH use   Significant Hospital Events: Including procedures, antibiotic start and stop dates in addition to other pertinent events   2/13 admitted to teaching service, later required HFNC and ICU trxfr, On Vanc/Ceftriaxone/Azithromycin 2/14 wbc ct up. Vancs stopped as MRSA PCR neg  ECHO Mderately reduced LV function with regional  wall motion abnormalities, 30-35%. There is hypokinesis of the basal to  mid segments of all walls of the LV. There is apical hyperkinesis. This pattern could represent an atypical  stress induced cardiomyopathy pattern as this spares the apex  (non-coronary distributions).  2/15 increased O2 needs. CXR worse. Mottled. Concern for shock. Vanc added back. Right IJ CVL placed. Right radial aline placed. Cards consulted. Intubated. BAL sent  2/16 remains intubated with high vent settings including FiO2 70% PEEP 10.  Lower extremities no longer mottled.  Proned for worsening ARDS 2/17 un-proned.  Started on levaquin, urine Legionella positive, down to 60%/ 10 peep, ongoing levophed 2/18 tmax 103, NE at 11 mcg, coox 88%, CVP 10, diuresed, down down 40%/ 8  Interim History / Subjective:   No significant overnight events.   Objective   Blood pressure 120/70, pulse (!) 102, temperature 98 F (36.7 C), temperature source Oral, resp. rate 19, height _0  (1.626 m), weight 41.8 kg, SpO2 97  %.        Intake/Output Summary (Last 24 hours) at 12/12/2021 0910 Last data filed at 12/12/2021 0800 Gross per 24 hour  Intake 1540 ml  Output 2600 ml  Net -1060 ml    Filed Weights   12/10/21 0500 12/11/21 0500 12/12/21 0500  Weight: 46.2 kg 44.6 kg 41.8 kg   Physical Exam:  General:  chronically ill, catechetic, no distress HEENT: dry MM, cortrak in place Cardiac: RRR Pulm: breathing comfortably on 5L supplemental oxygen. Lung sounds diminished throughout GI: soft, non-tender MSK: diffuse muscle atrophy  Assessment & Plan:   Acute hypoxic respiratory failure due to Legionella pneumonia COPD Intubated 2/15- 2/25; oxygen needs continue to improve Received 10d course of levofloxacin 2/17-2/26 - Wean O2 to keep saturations >88% - Continue prednisone - breo and incruse scheduled. Prn duonebs - appears dry; hold diamox for today - Medically stable for transfer out of ICU  Underweight; moderate malnutrition; post-extubation dysphagia Cortrak placed 2/22 - Nectar thick liquids - Hold TB encourage   Transaminitis. ? Steatosis. Does not need additional inpatient workup but can be followed up by PCP.   Macrocytic anemia. Stool occult positive on admission. Hemoglobin stable. Will need colonoscopy after discharge.   Tobacco use disorder--continue nicotine patches. Encourage cessation.   Sacral DPI stage 1--encourage frequent repositioning.   Physical deconditioning--will need ongoing acute rehabilitation after discharge. Will likely going to CIR.  Resolved hosp problems Septic shock Hyponatremia  Best Practice (right click and "Reselect all SmartList Selections" daily)   Diet/type: tubefeeds - transition to oral intake.  DVT prophylaxis: LMWH GI prophylaxis:  n/a Lines:  PIV Foley:  none Code Status:  full code  Mitzi Hansen, MD Internal Medicine Resident PGY-3 [facility] 12/12/2021 10:15 AM

## 2021-12-12 NOTE — Progress Notes (Signed)
Occupational Therapy Treatment Patient Details Name: Gina Cherry MRN: [id number] DOB: Sep 08, 1956 Today's Date: 12/12/2021   History of present illness Pt is 66 yo female who presents with 5 day h/o cough, SOB, diarrhea, and weakness that resulted in a fall with head lacerations, CT (-) for ICH. Pt found to have sepsis due to RLL PNA. Pt with worsening respiratory status on 2/15 and VDRF.  PMH: smoker, EtOH abuse.  Extubated 2/25.   OT comments  This 66 yo female doing well today with bed mobility, sit<>stand, marching in place, taking steps forward and back, and LBD. She continues to have significant balance issues when up on her feet without AD and therapist support. She will continue to benefit from acute OT with follow up OT on AIR.   Recommendations for follow up therapy are one component of a multi-disciplinary discharge planning process, led by the attending physician.  Recommendations may be updated based on patient status, additional functional criteria and insurance authorization.    Follow Up Recommendations  Acute inpatient rehab (3hours/day)    Assistance Recommended at Discharge Frequent or constant Supervision/Assistance  Patient can return home with the following  A little help with walking and/or transfers;A little help with bathing/dressing/bathroom;Assistance with feeding;Help with stairs or ramp for entrance;Assist for transportation;Assistance with cooking/housework;Direct supervision/assist for financial management;Direct supervision/assist for medications management   Equipment Recommendations  BSC/3in1;Tub/shower bench       Precautions / Restrictions Precautions Precautions: Fall Precaution Comments: NPO Restrictions Weight Bearing Restrictions: No       Mobility Bed Mobility Overal bed mobility: Needs Assistance Bed Mobility: Supine to Sit, Sit to Supine     Supine to sit: Min assist, HOB elevated Sit to supine: Min assist         Transfers Overall transfer level: Needs assistance Equipment used: Rolling walker (2 wheels) Transfers: Sit to/from Stand, Bed to chair/wheelchair/BSC Sit to Stand: Min assist Stand pivot transfers: Min assist         General transfer comment: Pt able to ambulate 5 feet forward with min A with RW and 5 steps back with Mod A (posterior lean)     Balance Overall balance assessment: Needs assistance Sitting-balance support: No upper extremity supported, Feet supported Sitting balance-Leahy Scale: Good     Standing balance support: Bilateral upper extremity supported, Reliant on assistive device for balance Standing balance-Leahy Scale: Poor Standing balance comment: posterior lean, did get better with each standing attempt (x5), pt able to march in place each time she stood (with this however she tended to do posterior lean again with VCs for upright posture)                           ADL either performed or assessed with clinical judgement   ADL Overall ADL's : Needs assistance/impaired                       Lower Body Dressing Details (indicate cue type and reason): Pt can now cross her legs to get to her feet while seated EOB--can doff but can't donn socks as of yet Toilet Transfer: Stand-pivot;Rolling walker (2 wheels);Minimal assistance                  Extremity/Trunk Assessment Upper Extremity Assessment Upper Extremity Assessment: Generalized weakness            Vision Patient Visual Report: No change from baseline  Cognition Arousal/Alertness: Awake/alert   Overall Cognitive Status: Impaired/Different from baseline Area of Impairment: Safety/judgement, Problem solving                         Safety/Judgement: Decreased awareness of safety, Decreased awareness of deficits   Problem Solving: Difficulty sequencing, Requires verbal cues                     Pertinent Vitals/ Pain       Pain  Assessment Pain Assessment: No/denies pain         Frequency  Min 2X/week        Progress Toward Goals  OT Goals(current goals can now be found in the care plan section)  Progress towards OT goals: Progressing toward goals  Acute Rehab OT Goals Patient Stated Goal: to continue to get better OT Goal Formulation: With patient Time For Goal Achievement: 12/23/21 Potential to Achieve Goals: Good  Plan Discharge plan remains appropriate       AM-PAC OT "6 Clicks" Daily Activity     Outcome Measure   Help from another person eating meals?: Total (NPO) Help from another person taking care of personal grooming?: A Little Help from another person toileting, which includes using toliet, bedpan, or urinal?: A Lot Help from another person bathing (including washing, rinsing, drying)?: A Lot Help from another person to put on and taking off regular upper body clothing?: A Little Help from another person to put on and taking off regular lower body clothing?: A Lot 6 Click Score: 13    End of Session Equipment Utilized During Treatment: Gait belt;Rolling walker (2 wheels);Oxygen (6 liters)  OT Visit Diagnosis: Unsteadiness on feet (R26.81);Other abnormalities of gait and mobility (R26.89);Muscle weakness (generalized) (M62.81);History of falling (Z91.81);Other symptoms and signs involving the nervous system (R29.898)   Activity Tolerance Patient tolerated treatment well   Patient Left in bed;with call bell/phone within reach;with bed alarm set;with family/visitor present   Nurse Communication Mobility status (pt doing better with sit>stand)        Time: 1275-1700 OT Time Calculation (min): 20 min  Charges: OT General Charges $OT Visit: 1 Visit OT Treatments $Self Care/Home Management : 8-22 mins  Ignacia Palma, OTR/L Acute [employer] Pager [phone] Office [phone]    [place], [place] 12/12/2021, 5:10 PM

## 2021-12-13 DIAGNOSIS — E440 Moderate protein-calorie malnutrition: Secondary | ICD-10-CM

## 2021-12-13 DIAGNOSIS — J189 Pneumonia, unspecified organism: Secondary | ICD-10-CM | POA: Diagnosis not present

## 2021-12-13 LAB — COMPREHENSIVE METABOLIC PANEL
ALT: 81 U/L — ABNORMAL HIGH (ref 0–44)
AST: 116 U/L — ABNORMAL HIGH (ref 15–41)
Albumin: 2.1 g/dL — ABNORMAL LOW (ref 3.5–5.0)
Alkaline Phosphatase: 90 U/L (ref 38–126)
Anion gap: 9 (ref 5–15)
BUN: 19 mg/dL (ref 8–23)
CO2: 27 mmol/L (ref 22–32)
Calcium: 8.9 mg/dL (ref 8.9–10.3)
Chloride: 101 mmol/L (ref 98–111)
Creatinine, Ser: 0.34 mg/dL — ABNORMAL LOW (ref 0.44–1.00)
GFR, Estimated: >60 mL/min (ref >60)
Glucose, Bld: 136 mg/dL — ABNORMAL HIGH (ref 70–99)
Potassium: 3.4 mmol/L — ABNORMAL LOW (ref 3.5–5.1)
Sodium: 137 mmol/L (ref 135–145)
Total Bilirubin: 0.7 mg/dL (ref 0.3–1.2)
Total Protein: 5.8 g/dL — ABNORMAL LOW (ref 6.5–8.1)

## 2021-12-13 LAB — CBC WITH DIFFERENTIAL/PLATELET
Abs Immature Granulocytes: 0.05 10*3/uL (ref 0.00–0.07)
Basophils Absolute: 0 10*3/uL (ref 0.0–0.1)
Basophils Relative: 0 %
Eosinophils Absolute: 0 10*3/uL (ref 0.0–0.5)
Eosinophils Relative: 0 %
HCT: 27.3 % — ABNORMAL LOW (ref 36.0–46.0)
Hemoglobin: 9.1 g/dL — ABNORMAL LOW (ref 12.0–15.0)
Immature Granulocytes: 1 %
Lymphocytes Relative: 17 %
Lymphs Abs: 1.3 10*3/uL (ref 0.7–4.0)
MCH: 34.7 pg — ABNORMAL HIGH (ref 26.0–34.0)
MCHC: 33.3 g/dL (ref 30.0–36.0)
MCV: 104.2 fL — ABNORMAL HIGH (ref 80.0–100.0)
Monocytes Absolute: 1 10*3/uL (ref 0.1–1.0)
Monocytes Relative: 12 %
Neutro Abs: 5.5 10*3/uL (ref 1.7–7.7)
Neutrophils Relative %: 70 %
Platelets: 367 10*3/uL (ref 150–400)
RBC: 2.62 MIL/uL — ABNORMAL LOW (ref 3.87–5.11)
RDW: 13.4 % (ref 11.5–15.5)
WBC: 7.9 10*3/uL (ref 4.0–10.5)
nRBC: 0 % (ref 0.0–0.2)

## 2021-12-13 LAB — MAGNESIUM: Magnesium: 2.1 mg/dL (ref 1.7–2.4)

## 2021-12-13 LAB — GLUCOSE, CAPILLARY
Glucose-Capillary: 127 mg/dL — ABNORMAL HIGH (ref 70–99)
Glucose-Capillary: 199 mg/dL — ABNORMAL HIGH (ref 70–99)
Glucose-Capillary: 157 mg/dL — ABNORMAL HIGH (ref 70–99)
Glucose-Capillary: 221 mg/dL — ABNORMAL HIGH (ref 70–99)
Glucose-Capillary: 104 mg/dL — ABNORMAL HIGH (ref 70–99)
Glucose-Capillary: 112 mg/dL — ABNORMAL HIGH (ref 70–99)

## 2021-12-13 LAB — PHOSPHORUS: Phosphorus: 4.3 mg/dL (ref 2.5–4.6)

## 2021-12-13 NOTE — TOC Initial Note (Signed)
Transition of Care [facility]) - Initial/Assessment Note    Patient Details  Name: Gina Cherry MRN: [id number] Date of Birth: 1956/10/04  [facility] CM/SW Contact:    Elliot Cousin, RN Phone Number: [phone] 12/13/2021, 10:12 AM  Clinical Narrative:                  HF TOC CM spoke to pt and SO at bedside commode. Pt agreeable to IP rehab prior to dc home. IP rehab following referral for appropriateness.  HF team reported to Unit CM/CSW to continue to follow for dc needs.     Expected Discharge Plan: IP Rehab Facility Barriers to Discharge: Continued Medical Work up   Patient Goals and CMS Choice Patient states their goals for this hospitalization and ongoing recovery are:: wants to get stronger before going home CMS Medicare.gov Compare Post Acute Care list provided to:: Patient Choice offered to / list presented to : Patient  Expected Discharge Plan and Services Expected Discharge Plan: IP Rehab Facility   Discharge Planning Services: CM Consult Post Acute Care Choice: IP Rehab Living arrangements for the past 2 months: Single Family Home                                      Prior Living Arrangements/Services Living arrangements for the past 2 months: Single Family Home Lives with:: Spouse Patient language and need for interpreter reviewed:: Yes        Need for Family Participation in Patient Care: Yes (Comment) Care giver support system in place?: Yes (comment)   Criminal Activity/Legal Involvement Pertinent to Current Situation/Hospitalization: No - Comment as needed  Activities of Daily Living Home Assistive Devices/Equipment: None ADL Screening (condition at time of admission) Patient's cognitive ability adequate to safely complete daily activities?: Yes Is the patient deaf or have difficulty hearing?: No Does the patient have difficulty seeing, even when wearing glasses/contacts?: No Does the patient have difficulty  concentrating, remembering, or making decisions?: No Patient able to express need for assistance with ADLs?: Yes Does the patient have difficulty dressing or bathing?: No Independently performs ADLs?: Yes (appropriate for developmental age) Does the patient have difficulty walking or climbing stairs?: No Weakness of Legs: None Weakness of Arms/Hands: None  Permission Sought/Granted Permission sought to share information with : Case Manager, Family Supports, PCP Permission granted to share information with : Yes, Verbal Permission Granted  Share Information with NAME: [phone]  Permission granted to share info w AGENCY: IP rehab  Permission granted to share info w Relationship: significant other  Permission granted to share info w Contact Information: [phone]  Emotional Assessment Appearance:: Appears stated age   Affect (typically observed): Accepting Orientation: : Oriented to Self, Oriented to Place, Oriented to  Time, Oriented to Situation   Psych Involvement: No (comment)  Admission diagnosis:  Weakness [R53.1] Sepsis (HCC) [A41.9] Diarrhea, unspecified type [R19.7] Community acquired pneumonia of right lung, unspecified part of lung [J18.9] Sepsis, due to unspecified organism, unspecified whether acute organ dysfunction present (HCC) [A41.9] Acute cough [R05.1] CAP (community acquired pneumonia) [J18.9] Patient Active Problem List   Diagnosis Date Noted   Acute cough    Pressure injury of skin 12/08/2021   Malnutrition of moderate degree 12/01/2021   Takotsubo cardiomyopathy 11/30/2021   Tobacco abuse 11/29/2021   Risk for falls 11/29/2021   Protein-calorie malnutrition, severe 11/29/2021   Septic shock (HCC) 11/28/2021  CAP (community acquired pneumonia)    Weakness    Acute respiratory failure (HCC)    Elevated LFTs    PCP:  Annamaria Helling, MD Pharmacy:   [facility] [phone] [place], [place] - [address] AT [facility] [address] & [facility]  [place] [address] [place] [address] Phone: [phone] Fax: [phone]     Social Determinants of Health (SDOH) Interventions    Readmission Risk Interventions No flowsheet data found.

## 2021-12-13 NOTE — Progress Notes (Signed)
Inpatient Rehab Admissions:  Inpatient Rehab Consult received.  I met with patient and boyfriend, Ronalee Belts at the bedside for rehabilitation assessment and to discuss goals and expectations of an inpatient rehab admission.  Both acknowledged understanding of CIR goals and expectations. Both interested in pt pursuing CIR. Ronalee Belts confirmed that he will be able to provide supervision/support for pt after discharge.  Will continue to follow.  Signed: Gayland Curry, [place], [place] Admissions Coordinator [phone]

## 2021-12-13 NOTE — PMR Pre-admission (Signed)
PMR Admission Coordinator Pre-Admission Assessment  Patient: Gina Cherry is an 66 y.o., female MRN: [id number] DOB: April 21, 1956 Height: 5\' 4"  (162.6 cm) Weight: 45.8 kg  Insurance Information HMO:     PPO: yes     PCP:      IPA:      80/20:      OTHER:  PRIMARY: [employer] Medicare      Policy#: [id number]      Subscriber: patient CM Name: Dewitt Rota        Phone#: [phone]     Fax#: [phone] Pre-Cert#: [id number] [place] for CIR from [place]  at [facility] with updates due to [place]  at fax listed above on 12/22/21      Employer:  Benefits:  Phone #:      Name:  Eff. Date: 10/16/20     Deduct: $-0      Out of Pocket Max: $4500 (met $285)     Life Max: n/a CIR: $295/day for days 1-6      SNF: 20 full days Outpatient:      Co-Pay: $35/visit Home Health: 100%      Co-Pay:  DME: 80%     Co-Ins: 20% Providers: in-network SECONDARY:       Policy#:      Phone#:   Development worker, community:       Phone#:   The [employer]uary for patients in Inpatient Rehabilitation Facilities with attached Privacy Act New Market Records was provided and verbally reviewed with: Patient and Family  Emergency Contact Information Contact Information     Name Relation Home Work Mobile   Robinson,Mike Significant other   [phone]       Current Medical History  Patient Admitting Diagnosis: sepsis d/t RLL PNA History of Present Illness: Pt is a 66 year old female with medical hx significant for: smoking, ETOH abuse. On 11/28/21, pt presented to hospital after illness x1 week. Pt had a 5d h/o cough, SOB, generalized weakness, diarrhea. Pt also had multiple falls which resulted in her hitting her head and sustaining a laceration and mild headache. Pt required 4L of oxygen. Pt was tachycardic and febrile upon arrival to ED. Pt Chest x-ray showed RLL PNA. Pt increased WOB and placed on HHFNC 100% 35L. Pt had to be placed on 15L NRB in addition to 100% HFNC on 11/30/21.  Cardiology consulted d/t pt's reduced LV dysfunction. EF 30-35%. However, cardiology feels EF is more likely 40-45%. Pt intubated on 11/30/21-12/10/21. Pt was able to be weaned to HFNC. Chest x-ray showed bilateral PNA R>L plus small left pleural effusion and moderate on right. Tube feeds started on 12/01/21. Repeat Echo on 2/20 showed EF 65-70%. Pt began PO diet (regular/NTL)  on 12/12/21.  Therapy evaluations completed and CIR recommended d/t pt's deficits in functional mobility and inability to complete ADLs independently.    Patient's medical record from [facility] has been reviewed by the rehabilitation admission coordinator and physician.  Past Medical History  Past Medical History:  Diagnosis Date   COPD (chronic obstructive pulmonary disease) ([place])    Osteoporosis    Underweight     Has the patient had major surgery during 100 days prior to admission? No  Family History   family history is not on file.  Current Medications  Current Facility-Administered Medications:    acetaminophen (TYLENOL) tablet 650 mg, 650 mg, Oral, Q6H PRN, Virl Axe, MD   aspirin chewable tablet 81 mg, 81 mg, Oral, Daily, Virl Axe, MD,  81 mg at 12/15/21 0954   bisacodyl (DULCOLAX) suppository 10 mg, 10 mg, Rectal, Daily PRN, Agarwala, Ravi, MD   chlorhexidine (PERIDEX) 0.12 % solution 15 mL, 15 mL, Mouth Rinse, BID, Dela Newman Nickels, MD, 15 mL at 12/14/21 2149   Chlorhexidine Gluconate Cloth 2 % PADS 6 each, 6 each, Topical, Q0600, Collier Bullock, MD, 6 each at 12/15/21 0609   docusate (COLACE) 50 MG/5ML liquid 100 mg, 100 mg, Oral, Daily PRN, Virl Axe, MD   enoxaparin (LOVENOX) injection 30 mg, 30 mg, Subcutaneous, Q24H, McQuaid, Douglas B, MD, 30 mg at 12/15/21 0953   fluticasone furoate-vilanterol (BREO ELLIPTA) 200-25 MCG/ACT 1 puff, 1 puff, Inhalation, Daily, Christian, Rylee, MD, 1 puff at 12/15/21 0900   insulin aspart (novoLOG) injection 0-9 Units, 0-9 Units, Subcutaneous,  Q4H, Laqueta Jean, MD, 2 Units at 12/15/21 0444   insulin glargine-yfgn (SEMGLEE) injection 5 Units, 5 Units, Subcutaneous, Daily, Jennelle Human B, NP, 5 Units at 12/15/21 0950   ipratropium-albuterol (DUONEB) 0.5-2.5 (3) MG/3ML nebulizer solution 3 mL, 3 mL, Nebulization, Q4H PRN, Dareen Piano, Nischal, MD, 3 mL at 12/09/21 0037   MEDLINE mouth rinse, 15 mL, Mouth Rinse, q12n4p, Laqueta Jean, MD, 15 mL at 12/14/21 1700   metoprolol tartrate (LOPRESSOR) tablet 25 mg, 25 mg, Oral, BID, Virl Axe, MD, 25 mg at 12/15/21 7793   nicotine (NICODERM CQ - dosed in mg/24 hours) patch 21 mg, 21 mg, Transdermal, Daily, Christian, Rylee, MD, 21 mg at 12/15/21 9030   oxyCODONE-acetaminophen (PERCOCET/ROXICET) 5-325 MG per tablet 2 tablet, 2 tablet, Oral, Q6H PRN, Virl Axe, MD   spironolactone (ALDACTONE) tablet 12.5 mg, 12.5 mg, Oral, Daily, Clegg, Amy D, NP, 12.5 mg at 12/15/21 0954   umeclidinium bromide (INCRUSE ELLIPTA) 62.5 MCG/ACT 1 puff, 1 puff, Inhalation, Daily, Christian, Rylee, MD, 1 puff at 12/15/21 0859   white petrolatum (VASELINE) gel, , Topical, PRN, Kipp Brood, MD  Patients Current Diet:  Diet Order             Diet regular Room service appropriate? Yes; Fluid consistency: Nectar Thick  Diet effective now                   Precautions / Restrictions Precautions Precautions: Fall Precaution Comments: NPO Restrictions Weight Bearing Restrictions: No Other Position/Activity Restrictions: watch sats and HR   Has the patient had 2 or more falls or a fall with injury in the past year? Yes  Prior Activity Level    Prior Functional Level Self Care: Did the patient need help bathing, dressing, using the toilet or eating? Independent  Indoor Mobility: Did the patient need assistance with walking from room to room (with or without device)? Independent  Stairs: Did the patient need assistance with internal or external stairs (with or without device)?  Independent  Functional Cognition: Did the patient need help planning regular tasks such as shopping or remembering to take medications? Independent  Patient Information    Patient's Response To:     Home Assistive Devices / Equipment Home Assistive Devices/Equipment: None Home Equipment: None  Prior Device Use: Indicate devices/aids used by the patient prior to current illness, exacerbation or injury? None of the above  Current Functional Level Cognition  Overall Cognitive Status: Within Functional Limits for tasks assessed Difficult to assess due to: Intubated, Impaired communication Orientation Level: Oriented X4 Safety/Judgement: Decreased awareness of safety, Decreased awareness of deficits General Comments: following commands and interactive, still reaches to her mouth, but not always  trying to pull tubes    Extremity Assessment (includes Sensation/Coordination)  Upper Extremity Assessment: Overall WFL for tasks assessed  Lower Extremity Assessment: Defer to PT evaluation    ADLs  Overall ADL's : Needs assistance/impaired Eating/Feeding: NPO Grooming: Set up, Sitting Upper Body Bathing: Minimal assistance, Sitting Lower Body Bathing: Moderate assistance, Sitting/lateral leans Upper Body Dressing : Minimal assistance, Sitting Lower Body Dressing: Moderate assistance, Minimal assistance, Sit to/from stand Lower Body Dressing Details (indicate cue type and reason): Pt can now cross her legs to get to her feet while seated EOB--can doff but can't donn socks as of yet Toilet Transfer: Stand-pivot, Rolling walker (2 wheels), Minimal assistance Toileting- Clothing Manipulation and Hygiene: Maximal assistance Functional mobility during ADLs: Moderate assistance, +2 for safety/equipment General ADL Comments: easily fatigues; complains of dizziness    Mobility  Overal bed mobility: Needs Assistance Bed Mobility: Supine to Sit Rolling: Mod assist, Max assist, +2 for physical  assistance Supine to sit: Supervision, HOB elevated Sit to supine: Supervision General bed mobility comments: up in chair after OT    Transfers  Overall transfer level: Needs assistance Equipment used: Rolling walker (2 wheels) Transfers: Sit to/from Stand Sit to Stand: Min guard Bed to/from chair/wheelchair/BSC transfer type:: Step pivot Stand pivot transfers: Min assist Step pivot transfers: Min assist  Lateral/Scoot Transfers: Mod assist, +2 physical assistance General transfer comment: up to stand from recliner to [place] for balance, stepping to [facility] with A for lines and to steady with RW    Ambulation / Gait / Stairs / Wheelchair Mobility  Ambulation/Gait Ambulation/Gait assistance: Herbalist (Feet): 22 Feet Assistive device: Rolling walker (2 wheels) Gait Pattern/deviations: Step-through pattern, Step-to pattern, Decreased stride length, Wide base of support, Decreased dorsiflexion - right, Decreased dorsiflexion - left, Trunk flexed General Gait Details: able to walk to door and back to chair with RW and stopping x 1 to flex ankles due to stiffness Pre-gait activities: pt marching in place, reduced foot clearance bilaterally, posterior lean    Posture / Balance Dynamic Sitting Balance Sitting balance - Comments: sitting EOB with hands beside and no support to trunk Balance Overall balance assessment: Modified Independent Sitting-balance support: No upper extremity supported, Feet supported Sitting balance-Leahy Scale: Good Sitting balance - Comments: sitting EOB with hands beside and no support to trunk Postural control: Other (comment) (anterior lean) Standing balance support: Reliant on assistive device for balance, Bilateral upper extremity supported Standing balance-Leahy Scale: Poor Standing balance comment: maintains a wide base of support, and continues to initially have difficulty shifting her weight forward on initial stand.  Unable to control  descent for sitting.    Special needs/care consideration Oxygen 4L HFNC, Skin Weeping: arm/right; Skin tear: pretibial/left; Pressure injury stage 1: sacrum/mid; MASD: perineum/right,left; Diabetic management yes   Previous Home Environment (from acute therapy documentation) Living Arrangements: Spouse/significant other Available Help at Discharge: Family, Available 24 hours/day Type of Home: House Home Layout: One level Home Access: Stairs to enter Entrance Stairs-Rails: Left Entrance Stairs-Number of Steps: 3 Bathroom Shower/Tub: Tub/shower unit, Charity fundraiser: [place]: No Additional Comments: partner is Consulting civil engineer. They are both retired. Just got back from vacation at [facility]  Discharge Living Setting Does the patient have any problems obtaining your medications?: No  Social/Family/Support Systems    Goals    Decrease burden of Care through IP rehab admission: NA  Possible need for SNF placement upon discharge: Not anticipated  Patient Condition: I have reviewed medical records from [facility], spoken with CSW, and patient and spouse. I met with patient at the bedside for inpatient rehabilitation assessment.  Patient will benefit from ongoing PT, OT, and SLP, can actively participate in 3 hours of therapy a day 5 days of the week, and can make measurable gains during the admission.  Patient will also benefit from the coordinated team approach during an Inpatient Acute Rehabilitation admission.  The patient will receive intensive therapy as well as Rehabilitation physician, nursing, social worker, and care management interventions.  Due to bladder management, safety, skin/wound care, disease management, medication administration, pain management, and patient education the patient requires 24 hour a day rehabilitation nursing.  The patient is currently min assist to min guard with mobility and basic ADLs.  Discharge setting and therapy post discharge at  home with home health is anticipated.  Patient has agreed to participate in the Acute Inpatient Rehabilitation Program and will admit today.  Preadmission Screen Completed By:  Shann Medal, PT, DPT, 12/15/2021 10:21 AM with day of updates by Clemens Catholic, MS, CCC-SLP  ______________________________________________________________________   Discussed status with Dr. Naaman Plummer on 12/18/21 at 745 and received approval for admission today.  Admission Coordinator:  Shann Medal, PT, DPT with updates by Clemens Catholic, MS, CCC-SLP time 802/Date 12/18/21   Assessment/Plan: Diagnosis: debility after ARDS/VDRF Does the need for close, 24 hr/day Medical supervision in concert with the patient's rehab needs make it unreasonable for this patient to be served in a less intensive setting? Yes Co-Morbidities requiring supervision/potential complications: pneumonia, sCHF/CM, sepsis Due to bladder management, bowel management, safety, skin/wound care, disease management, medication administration, pain management, and patient education, does the patient require 24 hr/day rehab nursing? Yes Does the patient require coordinated care of a physician, rehab nurse, PT, OT, and SLP to address physical and functional deficits in the context of the above medical diagnosis(es)? Yes Addressing deficits in the following areas: balance, endurance, locomotion, strength, transferring, bowel/bladder control, bathing, dressing, feeding, grooming, toileting, and psychosocial support Can the patient actively participate in an intensive therapy program of at least 3 hrs of therapy 5 days a week? Yes The potential for patient to make measurable gains while on inpatient rehab is excellent Anticipated functional outcomes upon discharge from inpatient rehab: modified independent PT, modified independent OT, n/a SLP Estimated rehab length of stay to reach the above functional goals is: 5-7 days Anticipated discharge destination: Home 10.  Overall Rehab/Functional Prognosis: excellent   MD Signature: Meredith Staggers, MD, [place] Director Rehabilitation Services 12/18/2021

## 2021-12-13 NOTE — Progress Notes (Signed)
Speech Language Pathology Treatment: Dysphagia  Patient Details Name: Gina Cherry MRN: [id number] DOB: 1955/11/25 Today's Date: 12/13/2021 Time: [id number] SLP Time Calculation (min) (ACUTE ONLY): 17 min  Assessment / Plan / Recommendation Clinical Impression  Treatment focused on tolerance of diet and use of compensatory strategies. Patient alert and cooperative, able to recall diet recommendations based on MBS complete the previous date but not rationale for diet. Educated patient on aspiration risk with thin liquids and prognosis. She remains moderately hoarse due to decreased glottal closure s/p 10 day intubation. Skilled observation of po intake complete, prescribed diet, via self feeding with SLP providing minimal verbal cueing for small sip size with nectar thick liquid. Patient with intermittent delayed cough of unknown origin. She does have baseline chest congestion which could contribute as MBS indicated appropriateness for nectar thick liquid. Tube feeds are continuing but have been adjusted since diet initiated per most recent nutrition note. Encouraged patient to maximize po intake in hopes of discontinuing Cortrak ASAP in order to facilitate increased safety and comfort with po intake as tube was suspected to increased degree of post swallow pharyngeal residue and impacts ease of cup drinking. Overall, current diet remains appropriate. SLP will continue to f/u.    HPI HPI: Pt is 66 yo female who presents with 5 day h/o cough, SOB, diarrhea, and weakness that resulted in a fall with head lacerations, CT (-) for ICH. Pt found to have sepsis due to RLL PNA. Pt with worsening respiratory status on 2/15 and VDRF, intubated.  PMH: smoker, EtOH abuse.  Extubated 2/25.      SLP Plan  Continue with current plan of care      Recommendations for follow up therapy are one component of a multi-disciplinary discharge planning process, led by the attending physician.  Recommendations may be  updated based on patient status, additional functional criteria and insurance authorization.    Recommendations  Diet recommendations: Regular;Nectar-thick liquid Liquids provided via: Cup;Straw Medication Administration: Whole meds with liquid (or puree) Supervision: Patient able to self feed;Full supervision/cueing for compensatory strategies Compensations: Slow rate;Small sips/bites Postural Changes and/or Swallow Maneuvers: Seated upright 90 degrees                Oral Care Recommendations: Oral care BID Follow Up Recommendations: Acute inpatient rehab (3hours/day) SLP Visit Diagnosis: Dysphagia, oropharyngeal phase (R13.12) Plan: Continue with current plan of care          [facility] MA, CCC-SLP  Gina Cherry  12/13/2021, 9:15 AM

## 2021-12-13 NOTE — Progress Notes (Signed)
Physical Therapy Treatment Patient Details Name: Gina Cherry MRN: [id number] DOB: Mar 10, 1956 Today's Date: 12/13/2021   History of Present Illness Pt is 66 yo female who presents with 5 day h/o cough, SOB, diarrhea, and weakness that resulted in a fall with head lacerations, CT (-) for ICH. Pt found to have sepsis due to RLL PNA. Pt with worsening respiratory status on 2/15 and VDRF.  PMH: smoker, EtOH abuse.  Extubated 2/25.    PT Comments    Patient progressing with activity tolerance, able to work with rest breaks close to 40 minutes.  She was too fatigued, however, to try and stay up in the chair.  She has improved even in the session with ankle mobility able to stand more erect and improved with COG over BOS over last session I saw her.  She remains appropriate for acute inpatient rehab prior to d/c home.  PT will continue to follow.    Recommendations for follow up therapy are one component of a multi-disciplinary discharge planning process, led by the attending physician.  Recommendations may be updated based on patient status, additional functional criteria and insurance authorization.  Follow Up Recommendations  Acute inpatient rehab (3hours/day)     Assistance Recommended at Discharge    Patient can return home with the following Two people to help with walking and/or transfers;Two people to help with bathing/dressing/bathroom;Assistance with cooking/housework;Direct supervision/assist for medications management;Direct supervision/assist for financial management;Assist for transportation;Help with stairs or ramp for entrance   Equipment Recommendations  Rolling walker (2 wheels);BSC/3in1    Recommendations for Other Services       Precautions / Restrictions Precautions Precautions: Fall     Mobility  Bed Mobility Overal bed mobility: Needs Assistance       Supine to sit: Supervision, HOB elevated Sit to supine: Supervision   General bed mobility comments:  pulls up on rails    Transfers Overall transfer level: Needs assistance Equipment used: Rolling walker (2 wheels) Transfers: Sit to/from Stand, Bed to chair/wheelchair/BSC Sit to Stand: Min guard   Step pivot transfers: Min assist       General transfer comment: up to RW with cues for hand placement, assist for balance, taking steps to recliner with RW, then to [facility], then to bed    Ambulation/Gait Ambulation/Gait assistance: Min assist Gait Distance (Feet): 3 Feet Assistive device: Rolling walker (2 wheels) Gait Pattern/deviations: Step-to pattern, Decreased stride length, Steppage, Trunk flexed, Wide base of support       General Gait Details: taking steps forward and back, then in/out at bedside with flexed posture and pt c/o calves sore; then stepping to recliner, then to [facility] reported less stiffness with more repititions   Stairs             Wheelchair Mobility    Modified Rankin (Stroke Patients Only)       Balance Overall balance assessment: Needs assistance   Sitting balance-Leahy Scale: Good     Standing balance support: Bilateral upper extremity supported Standing balance-Leahy Scale: Poor                              Cognition Arousal/Alertness: Awake/alert Behavior During Therapy: WFL for tasks assessed/performed Overall Cognitive Status: Within Functional Limits for tasks assessed  Exercises Other Exercises Other Exercises: sit<>stand x 4 reps Other Exercises: seated ankle DF x 20    General Comments General comments (skin integrity, edema, etc.): 4-5L HFNC SpO2 90's throughout session      Pertinent Vitals/Pain Pain Assessment Pain Assessment: No/denies pain    Home Living                          Prior Function            PT Goals (current goals can now be found in the care plan section) Acute Rehab PT Goals Patient Stated Goal: return home PT  Goal Formulation: With patient Time For Goal Achievement: 12/27/21 Potential to Achieve Goals: Good Progress towards PT goals: Progressing toward goals    Frequency    Min 3X/week      PT Plan Current plan remains appropriate    Co-evaluation              AM-PAC PT "6 Clicks" Mobility   Outcome Measure  Help needed turning from your back to your side while in a flat bed without using bedrails?: A Little Help needed moving from lying on your back to sitting on the side of a flat bed without using bedrails?: A Little Help needed moving to and from a bed to a chair (including a wheelchair)?: A Little Help needed standing up from a chair using your arms (e.g., wheelchair or bedside chair)?: A Little Help needed to walk in hospital room?: Total Help needed climbing 3-5 steps with a railing? : Total 6 Click Score: 14    End of Session Equipment Utilized During Treatment: Gait belt;Oxygen Activity Tolerance: Patient tolerated treatment well Patient left: in bed;with call bell/phone within reach;with family/visitor present   PT Visit Diagnosis: Muscle weakness (generalized) (M62.81);Difficulty in walking, not elsewhere classified (R26.2)     Time: 1620-1700 PT Time Calculation (min) (ACUTE ONLY): 40 min  Charges:  $Therapeutic Exercise: 8-22 mins $Therapeutic Activity: 23-37 mins                     Magda Kiel, PT Acute Rehabilitation Services Pager:[phone] [phone] 12/13/2021    Gina Cherry 12/13/2021, 5:37 PM

## 2021-12-13 NOTE — Progress Notes (Signed)
Transfer note: Gina Cherry is a 66 year old female who initially presented on 2/13 for shortness of breath.  She subsequently required high flow nasal cannula and ICU transfer.  She had worsening respiratory status requiring mechanical intubation and pressor support.  She was found to have ARDS and Legionella pneumonia treated with Levaquin.  She was successfully extubated on 2/25 and weaned to [place]. Transferred out of the ICU on 2/27.  Subjective: No acute overnight events.  Patient reports feeling well this morning.  She states that she walked around the floor today and had a bath.  Denies any pain or complaints at this time.  States that she is in no rush to have her feeding tube removed as she does not want to have it replaced.  She has been tolerating oral intake.   Objective:  Vital signs in last 24 hours: Vitals:   12/13/21 0000 12/13/21 0100 12/13/21 0325 12/13/21 0407  BP: (!) 109/54 115/60 111/63   Pulse: 74 78    Resp: 15 (!) 21    Temp: 98.7 F (37.1 C)  (!) 97.4 F (36.3 C)   TempSrc: Axillary  Oral   SpO2: 97% 91% 91%   Weight:    45.8 kg  Height:       Physical Exam General: alert, appears stated age, in no acute distress, ill-appearing, cachectic HEENT: Normocephalic, atraumatic, EOM intact, conjunctiva normal CV: Regular rate and rhythm, no murmurs rubs or gallops Pulm: Breathing comfortably on 4L HFNC, clear to auscultation bilaterally Abdomen: Soft, nondistended, bowel sounds present, no tenderness to palpation MSK: No lower extremity edema Skin: Warm and dry Neuro: Alert and oriented x3   Assessment/Plan:  Principal Problem:   CAP (community acquired pneumonia) Active Problems:   Septic shock (HCC)   Weakness   Acute respiratory failure (HCC)   Elevated LFTs   Tobacco abuse   Risk for falls   Protein-calorie malnutrition, severe   Takotsubo cardiomyopathy   Malnutrition of moderate degree   Pressure injury of skin   Acute cough  Ms.  Cherry is a 66 year old female who presented with community-acquired pneumonia and ARDS complicated by shock and stress cardiomyopathy requiring ICU transfer, intubation and pressor support.  Patient was successfully extubated on 2/25 and transferred out of the ICU on 2/27.  Acute hypoxic respiratory failure due to Legionella pneumonia COPD Patient was intubated 2/15 to 2/25.  Successfully extubated and doing well on 4 L high flow nasal cannula.  She completed a 10-day course of levofloxacin from 2/17-2/26.  Oxygen needs continue to improve. -Wean O2 to keep saturations greater than 88% -Continue prednisone for total of 5 days -Continue Breo and Incruse -As needed DuoNeb  Stress cardiomyopathy Acute systolic heart failure, recovered Echocardiogram 2/14 showed an EF of 30 to 45% though cardiology felt it was closer to 40 to 45% with normal RV function.  WMA pattern consistent with stress-induced cardiomyopathy.  Repeat echo on 2/20 showed improvement of EF to 65 to 70%.  Patient was diuresed aggressively with IV Lasix and Diamox over the weekend.  Now on 12.5 mg spironolactone and metoprolol 25 mg twice daily.  Patient deferred further work-up with cath.  Heart failure to follow-up outpatient and reassess. -Continue spironolactone and metoprolol  Moderate malnutrition Underweight Postextubation dysphagia Tube feeds were initiated on 2/16.  Core track tube placed 2/22.  Patient passed swallow evaluation on 2/27 and diet was advanced.  She is tolerating this well.  Core track remains in place.  Patient states that her  appetite is improving and she tolerated oral intake yesterday.  As this improves will hopefully be closer to removing core track. -Appreciate RD assistance -Continue supplementation and encourage oral intake  Transaminitis Stable.  Thought to be secondary to shock versus EtOH/steatosis.  Will continue to monitor.  Recommend outpatient work-up by PCP.  Hypokalemia Replete as  needed and trend BMP.  Microcytic anemia Stool occult was positive on admission.  Hemoglobin has remained stable.  Recommend outpatient colonoscopy after discharge.  -Trend CBC  Tobacco use disorder -Continue nicotine patches -Encourage cessation  Sacral DTI stage I -Frequent repositioning  Physical deconditioning Patient will need ongoing acute rehabilitation after discharge.  PT OT recommending acute inpatient rehab (3 hours/day).  Prior to Admission Living Arrangement: Home Anticipated Discharge Location: [facility]  Barriers to Discharge: Medical improvement Dispo: Anticipated discharge pending clinical improvement.  Dacari Beckstrand N, DO 12/13/2021, 7:10 AM Pager: [phone] After 5pm on weekdays and 1pm on weekends: On Call pager [phone]

## 2021-12-13 NOTE — Care Management Important Message (Signed)
Important Message  Patient Details  Name: Gina Cherry MRN: [id number] Date of Birth: 1956-02-08   Medicare Important Message Given:  Yes     Sherilyn Banker 12/13/2021, 12:39 PM

## 2021-12-14 ENCOUNTER — Encounter (HOSPITAL_COMMUNITY): Payer: Self-pay | Admitting: Internal Medicine

## 2021-12-14 DIAGNOSIS — J189 Pneumonia, unspecified organism: Secondary | ICD-10-CM | POA: Diagnosis not present

## 2021-12-14 DIAGNOSIS — E440 Moderate protein-calorie malnutrition: Secondary | ICD-10-CM | POA: Diagnosis not present

## 2021-12-14 LAB — COMPREHENSIVE METABOLIC PANEL
ALT: 147 U/L — ABNORMAL HIGH (ref 0–44)
AST: 161 U/L — ABNORMAL HIGH (ref 15–41)
Albumin: 2.1 g/dL — ABNORMAL LOW (ref 3.5–5.0)
Alkaline Phosphatase: 112 U/L (ref 38–126)
Anion gap: 7 (ref 5–15)
BUN: 16 mg/dL (ref 8–23)
CO2: 31 mmol/L (ref 22–32)
Calcium: 9.1 mg/dL (ref 8.9–10.3)
Chloride: 101 mmol/L (ref 98–111)
Creatinine, Ser: 0.41 mg/dL — ABNORMAL LOW (ref 0.44–1.00)
GFR, Estimated: >60 mL/min (ref >60)
Glucose, Bld: 144 mg/dL — ABNORMAL HIGH (ref 70–99)
Potassium: 3.9 mmol/L (ref 3.5–5.1)
Sodium: 139 mmol/L (ref 135–145)
Total Bilirubin: 0.3 mg/dL (ref 0.3–1.2)
Total Protein: 5.8 g/dL — ABNORMAL LOW (ref 6.5–8.1)

## 2021-12-14 LAB — CBC WITH DIFFERENTIAL/PLATELET
Abs Immature Granulocytes: 0.03 10*3/uL (ref 0.00–0.07)
Basophils Absolute: 0 10*3/uL (ref 0.0–0.1)
Basophils Relative: 0 %
Eosinophils Absolute: 0.1 10*3/uL (ref 0.0–0.5)
Eosinophils Relative: 1 %
HCT: 27.3 % — ABNORMAL LOW (ref 36.0–46.0)
Hemoglobin: 9.2 g/dL — ABNORMAL LOW (ref 12.0–15.0)
Immature Granulocytes: 0 %
Lymphocytes Relative: 22 %
Lymphs Abs: 1.6 10*3/uL (ref 0.7–4.0)
MCH: 35.5 pg — ABNORMAL HIGH (ref 26.0–34.0)
MCHC: 33.7 g/dL (ref 30.0–36.0)
MCV: 105.4 fL — ABNORMAL HIGH (ref 80.0–100.0)
Monocytes Absolute: 0.9 10*3/uL (ref 0.1–1.0)
Monocytes Relative: 12 %
Neutro Abs: 4.6 10*3/uL (ref 1.7–7.7)
Neutrophils Relative %: 65 %
Platelets: 451 10*3/uL — ABNORMAL HIGH (ref 150–400)
RBC: 2.59 MIL/uL — ABNORMAL LOW (ref 3.87–5.11)
RDW: 13.2 % (ref 11.5–15.5)
WBC: 7.3 10*3/uL (ref 4.0–10.5)
nRBC: 0 % (ref 0.0–0.2)

## 2021-12-14 LAB — GLUCOSE, CAPILLARY
Glucose-Capillary: 114 mg/dL — ABNORMAL HIGH (ref 70–99)
Glucose-Capillary: 143 mg/dL — ABNORMAL HIGH (ref 70–99)
Glucose-Capillary: 150 mg/dL — ABNORMAL HIGH (ref 70–99)
Glucose-Capillary: 154 mg/dL — ABNORMAL HIGH (ref 70–99)
Glucose-Capillary: 185 mg/dL — ABNORMAL HIGH (ref 70–99)
Glucose-Capillary: 191 mg/dL — ABNORMAL HIGH (ref 70–99)

## 2021-12-14 MED ORDER — LACTATED RINGERS IV BOLUS
500.0000 mL | Freq: Once | INTRAVENOUS | Status: AC
Start: 2021-12-14 — End: 2021-12-14
  Administered 2021-12-14: 500 mL via INTRAVENOUS

## 2021-12-14 MED ORDER — LACTATED RINGERS IV BOLUS: 500.0000 mL | Freq: Once | INTRAVENOUS | Status: DC

## 2021-12-14 MED ORDER — ASPIRIN 81 MG PO CHEW
81.0000 mg | CHEWABLE_TABLET | Freq: Every day | ORAL | Status: DC
  Administered 2021-12-15 – 2021-12-18 (×4): 81 mg via ORAL
  Filled 2021-12-14 (×4): qty 1

## 2021-12-14 MED ORDER — ACETAMINOPHEN 325 MG PO TABS: 650.0000 mg | ORAL_TABLET | Freq: Four times a day (QID) | ORAL | Status: DC | PRN

## 2021-12-14 MED ORDER — METOPROLOL TARTRATE 25 MG PO TABS
25.0000 mg | ORAL_TABLET | Freq: Two times a day (BID) | ORAL | Status: DC
  Administered 2021-12-14 – 2021-12-18 (×8): 25 mg via ORAL
  Filled 2021-12-14 (×9): qty 1

## 2021-12-14 MED ORDER — OXYCODONE-ACETAMINOPHEN 5-325 MG PO TABS: 2.0000 | ORAL_TABLET | Freq: Four times a day (QID) | ORAL | Status: DC | PRN

## 2021-12-14 MED ORDER — LACTATED RINGERS IV BOLUS
500.0000 mL | Freq: Once | INTRAVENOUS | Status: AC
  Administered 2021-12-14: 500 mL via INTRAVENOUS

## 2021-12-14 MED ORDER — DOCUSATE SODIUM 50 MG/5ML PO LIQD: 100.0000 mg | Freq: Every day | ORAL | Status: DC | PRN

## 2021-12-14 NOTE — Progress Notes (Signed)
Nutrition Follow-up ? ?DOCUMENTATION CODES:  ? ?Underweight, Non-severe (moderate) malnutrition in context of social or environmental circumstances ? ?INTERVENTION:  ? ?- Continue Nepro Shake po BID, each supplement provides 425 kcal and 19 grams protein ? ?- Continue Mighty Shake po TID with meals, each supplement provides 330 kcal and 9 grams of protein ? ?- Encourage PO intake ? ?NUTRITION DIAGNOSIS:  ? ?Moderate Malnutrition (in the context of social/environmental circumstances) related to (prolonged inadequate energy intake) as evidenced by mild fat depletion, moderate fat depletion, severe muscle depletion. ? ?Ongoing, being addressed via oral nutrition supplements ? ?GOAL:  ? ?Patient will meet greater than or equal to 90% of their needs ? ?Progressing ? ?MONITOR:  ? ?Diet advancement, I & O's, TF tolerance ? ?REASON FOR ASSESSMENT:  ? ?Consult ?Enteral/tube feeding initiation and management ? ?ASSESSMENT:  ? ?Pt with no known PMH except long history of tobacco use, currently smoking 1/2 pack per day, ETOH use (4 glasses of wine/day) admitted after fall at home with sepsis secondary to RLL PNA. ? ?02/15 - intubated ?02/16 - prone, TF initiated ?02/17 - placed supine ?02/22 - Cortrak tube placed ?02/25 - extubated ?02/27 - MBS, diet advanced to regular with nectar-thick liquids, transitioned to nocturnal TF ?03/02 - Cortrak removed, nocturnal TF discontinued ? ?Attempted to speak with pt and family x 2 but pt unavailable on both occasions (using bedside commode and requesting NT or RN assistance in getting back to bed). Noted Cortrak has been removed and nocturnal tube feeds have been discontinued. Pt with Nepro shake on tray table on RD's first attempted visit. Unsure how much pt may have consumed. ? ?Will continue with current oral nutrition supplements to promote PO intake. Noted plan for pt to d/c to CIR. ? ?Admit weight: 53.5 kg ?Current weight: 45.8 kg ? ?Meal Completion: 5% x 1 meal on 2/28, 60% x 1  meal on 3/1 ? ?Medications reviewed and include: colace, SSI q 4 hours, semglee 5 units daily, spironolactone ? ?Labs reviewed: elevated LFTs, hemoglobin 9.2 ?CBG's: 114-191 x 24 hours ? ?Diet Order:   ?Diet Order   ? ?       ?  Diet regular Room service appropriate? Yes; Fluid consistency: Nectar Thick  Diet effective now       ?  ? ?  ?  ? ?  ? ? ?EDUCATION NEEDS:  ? ?Education needs have been addressed ? ?Skin:  Skin Assessment: Reviewed RN Assessment (MASD perineum, skin tear LLE) ? ?Last BM:  12/15/21 medium type 6 ? ?Height:  ? ?Ht Readings from Last 1 Encounters:  ?11/30/21 5\' 4"  (1.626 m)  ? ? ?Weight:  ? ?Wt Readings from Last 1 Encounters:  ?12/15/21 45.8 kg  ? ? ?Ideal Body Weight:  54.5 kg ? ?BMI:  Body mass index is 17.33 kg/m?. ? ?Estimated Nutritional Needs:  ? ?Kcal:  1500-1700 kcal/d ? ?Protein:  75-85 g/d ? ?Fluid:  1.5-1.7 L/d ? ? ? ?Gustavus Bryant, MS, RD, LDN ?Inpatient Clinical Dietitian ?Please see AMiON for contact information. ? ?

## 2021-12-14 NOTE — Hospital Course (Addendum)
Was able to use walker all the way out to hallway and able to come inside go to window and then back to bed. She was tired at the end but happy that she was able to do so. Felt her breathing improved again after a few minutes. ? ?Patient and husband live together. Has family in the area.  ? ?Wondering if she would be able to do home physical therapy. Husband is able to be there for help throughout the day. ? ?Would like a home bedside commode and home oxygen just to help her after discharge. ?

## 2021-12-14 NOTE — Progress Notes (Signed)
Occupational Therapy Treatment ?Patient Details ?Name: Gina Cherry ?MRN: [id number] ?DOB: 1956/09/25 ?Today's Date: 12/14/2021 ? ? ?History of present illness Pt is 66 yo female who presents with 5 day h/o cough, SOB, diarrhea, and weakness that resulted in a fall with head lacerations, CT (-) for ICH. Pt found to have sepsis due to RLL PNA. Pt with worsening respiratory status on 2/15 and VDRF.  PMH: smoker, EtOH abuse.  Extubated 2/25. ?  ?OT comments ? Patient with good progress toward patient focused goals.  Improved activity tolerance and ability to maintain standing with less fatigue noted.  OT will look to progress mobility in the room/toileting, and begin more activities in standing.  Deficits impacting independence remain, and thus AIR continues to be recommended for post acute rehab prior to returning home.  The patient demonstrates the ability to progress her functional independence with a more intensive level of rehab.  OT will continue its efforts in the acute setting.    ? ?Recommendations for follow up therapy are one component of a multi-disciplinary discharge planning process, led by the attending physician.  Recommendations may be updated based on patient status, additional functional criteria and insurance authorization. ?   ?Follow Up Recommendations ? Acute inpatient rehab (3hours/day)  ?  ?Assistance Recommended at Discharge Frequent or constant Supervision/Assistance  ?Patient can return home with the following ? A little help with walking and/or transfers;A little help with bathing/dressing/bathroom;Help with stairs or ramp for entrance;Assist for transportation;Assistance with cooking/housework;Direct supervision/assist for medications management ?  ?Equipment Recommendations ?    ?  ?Recommendations for Other Services   ? ?  ?Precautions / Restrictions Precautions ?Precautions: Fall ?Restrictions ?Weight Bearing Restrictions: No ?Other Position/Activity Restrictions: watch sats and HR   ? ? ?  ? ?Mobility Bed Mobility ?Overal bed mobility: Needs Assistance ?Bed Mobility: Supine to Sit ?  ?  ?Supine to sit: Supervision, HOB elevated ?  ?  ?  ?Patient Response: Cooperative ? ?Transfers ?Overall transfer level: Needs assistance ?Equipment used: Rolling walker (2 wheels) ?Transfers: Sit to/from Stand, Bed to chair/wheelchair/BSC ?Sit to Stand: Min guard ?  ?  ?Step pivot transfers: Min assist ?  ?  ?  ?  ?  ?Balance Overall balance assessment: Needs assistance ?Sitting-balance support: No upper extremity supported, Feet supported ?Sitting balance-Leahy Scale: Good ?  ?  ?Standing balance support: Bilateral upper extremity supported ?Standing balance-Leahy Scale: Poor ?Standing balance comment: maintains a wide base of support, and continues to initially have difficulty shifting her weight forward on initial stand.  Unable to control descent for sitting. ?  ?  ?  ?  ?  ?  ?  ?  ?  ?  ?  ?   ? ?ADL either performed or assessed with clinical judgement  ? ?ADL   ?  ?  ?Grooming: Set up;Sitting ?  ?  ?  ?  ?  ?Upper Body Dressing : Minimal assistance;Sitting ?  ?Lower Body Dressing: Moderate assistance;Minimal assistance;Sit to/from stand ?  ?Toilet Transfer: Stand-pivot;Rolling walker (2 wheels);Minimal assistance ?  ?  ?  ?  ?  ?  ?  ?  ? ?Extremity/Trunk Assessment Upper Extremity Assessment ?Upper Extremity Assessment: Overall WFL for tasks assessed ?  ?Lower Extremity Assessment ?Lower Extremity Assessment: Defer to PT evaluation ?  ?Cervical / Trunk Assessment ?Cervical / Trunk Assessment: Normal ?  ? ?   ?  ?  ?   ?  ?   ?  ? ?Cognition Arousal/Alertness: Awake/alert ?  Behavior During Therapy: [facility] for tasks assessed/performed ?Overall Cognitive Status: Within Functional Limits for tasks assessed ?  ?  ?  ?  ?  ?  ?  ?  ?  ?  ?  ?  ?  ?  ?  ?Problem Solving: Slow processing ?  ?  ?  ?   ?   ? ?  ?   ? ? ?  ?    ? ? ?Pertinent Vitals/ Pain       Pain Assessment ?Pain Assessment: No/denies pain ? ?Home  Living   ?  ?  ?  ?  ?  ?  ?  ?  ?  ?  ?  ?  ?  ?  ?  ?  ?  ?  ? ?  ?Prior Functioning/Environment    ?  ?  ?  ?   ? ?Frequency ? Min 2X/week  ? ? ? ? ?  ?Progress Toward Goals ? ?OT Goals(current goals can now be found in the care plan section) ? Progress towards OT goals: Progressing toward goals ? ?Acute Rehab OT Goals ?OT Goal Formulation: With patient ?Time For Goal Achievement: 12/23/21 ?Potential to Achieve Goals: Good  ?Plan Discharge plan remains appropriate   ? ?Co-evaluation ? ? ?   ?  ?  ?  ?  ? ?  ?AM-PAC OT "6 Clicks" Daily Activity     ?Outcome Measure ? ? Help from another person eating meals?: None ?Help from another person taking care of personal grooming?: None ?Help from another person toileting, which includes using toliet, bedpan, or urinal?: A Lot ?Help from another person bathing (including washing, rinsing, drying)?: A Lot ?Help from another person to put on and taking off regular upper body clothing?: A Little ?Help from another person to put on and taking off regular lower body clothing?: A Lot ?6 Click Score: 17 ? ?  ?End of Session Equipment Utilized During Treatment: Rolling walker (2 wheels);Oxygen ? ?OT Visit Diagnosis: Unsteadiness on feet (R26.81);Other abnormalities of gait and mobility (R26.89);Muscle weakness (generalized) (M62.81);History of falling (Z91.81);Other symptoms and signs involving the nervous system (R29.898) ?  ?Activity Tolerance Patient tolerated treatment well ?  ?Patient Left in chair;with call bell/phone within reach;with family/visitor present ?  ?Nurse Communication Mobility status ?  ? ?   ? ?Time: 3790-2409 ?OT Time Calculation (min): 18 min ? ?Charges: OT General Charges ?$OT Visit: 1 Visit ?OT Treatments ?$Self Care/Home Management : 8-22 mins ? ?12/14/2021 ? ?RP, OTR/L ? ?Acute Rehabilitation Services ? ?Office:  [phone] ? ? ?Hardin Hardenbrook D Khali Perella ?12/14/2021, 11:56 AM ?

## 2021-12-14 NOTE — Progress Notes (Addendum)
Inpatient Rehab Admissions Coordinator:  ? ?Following for my colleague, Gayland Curry.  Opened insurance for prior auth request for CIR.  Met with pt and her s/o at the bedside to update and answer questions.  ? ?Shann Medal, PT, DPT ?Admissions Coordinator ?[phone] ?12/14/21  ?1:02 PM ? ?

## 2021-12-14 NOTE — Progress Notes (Addendum)
? ?Transfer note: Gina Cherry is a 66 year old female who initially presented on 2/13 for shortness of breath.  She subsequently required high flow nasal cannula and ICU transfer.  She had worsening respiratory status requiring mechanical intubation and pressor support.  She was found to have ARDS and Legionella pneumonia treated with Levaquin.  She was successfully extubated on 2/25 and weaned to [place]. Transferred out of the ICU on 2/27. ? ?Subjective: No acute overnight events.  Patient reports feeling well this morning.  Denies any pain or complaints at this time. She looks forward to going to CIR. She has been tolerating oral intake well, and is agreeable to removing Cortrak. ? ? ?Objective: ? ?Vital signs in last 24 hours: ?Vitals:  ? 12/13/21 1530 12/13/21 1925 12/13/21 2302 12/14/21 0302  ?BP: (!) 112/56 (!) 118/59 111/63 (!) 113/52  ?Pulse: (!) 101 100 91 89  ?Resp: 15 17 14 16   ?Temp: 97.6 ?F (36.4 ?C) 98.1 ?F (36.7 ?C) 98.3 ?F (36.8 ?C) 98 ?F (36.7 ?C)  ?TempSrc: Oral Oral Oral Oral  ?SpO2: 93% 98% 96% 98%  ?Weight:      ?Height:      ? ?Physical Exam ?General: alert, appears stated age, in no acute distress, ill-appearing, cachectic ?HEENT: Normocephalic, atraumatic, perioral blisters in different stages of healing with no intraoral lesions, EOM intact, conjunctiva normal ?CV: Regular rate and rhythm, no murmurs rubs or gallops ?Pulm: Breathing comfortably on 5L HFNC initially; decreased to 3L and still breathing comfortably satting 96%, clear to auscultation bilaterally ?Abdomen: Soft, nondistended, bowel sounds present, no tenderness to palpation ?MSK: No lower extremity edema ?Skin: Warm and dry; decreased skin turgor of bilateral LE. ?Neuro: Alert and oriented x3  ? ?Assessment/Plan: ? ?Principal Problem: ?  CAP (community acquired pneumonia) ?Active Problems: ?  Septic shock ([place]) ?  Weakness ?  Acute respiratory failure ([place]) ?  Elevated LFTs ?  Tobacco abuse ?  Risk for falls ?   Protein-calorie malnutrition, severe ?  Takotsubo cardiomyopathy ?  Malnutrition of moderate degree ?  Pressure injury of skin ?  Acute cough ? ?Gina Cherry is a 66 year old female who presented with community-acquired Legionella pneumonia and ARDS complicated by shock and stress cardiomyopathy requiring ICU transfer, intubation and pressor support.  Patient was successfully extubated on 2/25 and transferred out of the ICU on 2/27. ? ?Acute hypoxic respiratory failure due to Legionella pneumonia in setting of ?COPD, improving. ?Patient was intubated 2/15 to 2/25.  Successfully extubated and doing well today on 3 L high flow nasal cannula.  She completed a 10-day course of levofloxacin from 2/17-2/26.  Oxygen needs to continue to improve. ?-Wean O2 to keep saturations greater than 88%; 96% on 3L HFNC ?-Continue prednisone for total of 5 days (Day 5/5 today) ?-Continue Breo and Incruse ?-As needed DuoNeb ? 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  WMA pattern consistent with stress-induced cardiomyopathy.  Repeat echo on 2/20 showed improvement of EF to 65 to 70%.  Patient was diuresed aggressively with IV Lasix and Diamox over the weekend.  Now on 12.5 mg spironolactone and metoprolol 25 mg twice daily.  Patient deferred further work-up with cath.  Heart failure to follow-up outpatient and reassess. ?-Continue spironolactone and metoprolol ? ?Moderate malnutrition ?Underweight ?Post-extubation dysphagia ?Tube feeds were initiated on 2/16.  Core track tube placed 2/22.  Patient passed swallow  evaluation on 2/27 and diet was advanced, though she is currently requiring nectar thick liquids.  She is tolerating this well.  Core track remains in place.  Patient states that her appetite is improving and she tolerated oral intake yesterday and this morning.  As this improves  will hopefully be closer to removing core track. Clinically this could be removed now, though she is concerned that she won't be able to maintain adequate intake and doesn't want it to be replaced (we reassured her it would not be). ?-Appreciate RD assistance ?-Continue supplementation and encourage oral intake ? ?Dehydration 2/2 poor PO fluid intake ?Decreased skin turgor of BLE.  ?-- Increase IV fluids to 50 cc/hr ?-- Encouraged to increase PO intake as tolerated ? ?Transaminitis ?Stable.  Thought to be secondary to shock versus EtOH/steatosis.  Will continue to monitor.  Recommend outpatient work-up by PCP if it fails to resolve. ? ?Hypokalemia, resolved ?  ?Microcytic anemia ?Stool occult was positive on admission.  Hemoglobin has remained stable.  Recommend outpatient colonoscopy after discharge.  ?  ?Tobacco use disorder ?-Continue nicotine patches ?-Encourage cessation ? ?Sacral DTI stage I ?-Frequent repositioning.  Heels have cushion dressings applied, no tenderness. ? ?Physical deconditioning ?Patient will need ongoing acute rehabilitation after discharge.  PT OT recommending acute inpatient rehab (3 hours/day). She is eager. ? ?Prior to Admission Living Arrangement: Home ?Anticipated Discharge Location: [facility]  ?Barriers to Discharge: Medical improvement ?Dispo: Anticipated discharge pending clinical improvement. ? ?Gina Schlatter, MD ?12/14/2021, 6:42 AM ?PGY-1 ?Internal Medicine/Psych Resident ?Pager: [phone] ?After 5pm on weekdays and 1pm on weekends: On Call pager [phone] ? ?

## 2021-12-14 NOTE — Progress Notes (Signed)
Physical Therapy Treatment ?Patient Details ?Name: Gina Cherry ?MRN: [id number] ?DOB: 11-15-1955 ?Today's Date: 12/14/2021 ? ? ?History of Present Illness Pt is 66 yo female who presents with 5 day h/o cough, SOB, diarrhea, and weakness that resulted in a fall with head lacerations, CT (-) for ICH. Pt found to have sepsis due to RLL PNA. Pt with worsening respiratory status on 2/15 and VDRF.  PMH: smoker, EtOH abuse.  Extubated 2/25. ? ?  ?PT Comments  ? ? Patient progressing this session to room level ambulation with RW.  She still maintains increased distance to walker and flexed at hips and stops to rest due to ankle stiffness.  SpO2 on 3L O2 maintained 90% or above with ambulation.  She was just s/p OT so too fatigued for second walk, but continues to progress activity tolerance and remains excellent candidate for acute inpatient rehab.    ?Recommendations for follow up therapy are one component of a multi-disciplinary discharge planning process, led by the attending physician.  Recommendations may be updated based on patient status, additional functional criteria and insurance authorization. ? ?Follow Up Recommendations ? Acute inpatient rehab (3hours/day) ?  ?  ?Assistance Recommended at Discharge    ?Patient can return home with the following Assistance with cooking/housework;Direct supervision/assist for medications management;Direct supervision/assist for financial management;Assist for transportation;Help with stairs or ramp for entrance;A little help with walking and/or transfers;A little help with bathing/dressing/bathroom ?  ?Equipment Recommendations ? Rolling walker (2 wheels);BSC/3in1  ?  ?Recommendations for Other Services   ? ? ?  ?Precautions / Restrictions Precautions ?Precautions: Fall ?Restrictions ?Weight Bearing Restrictions: No ?Other Position/Activity Restrictions: watch sats and HR  ?  ? ?Mobility ? Bed Mobility ?  ?  ?  ?  ?  ?  ?  ?General bed mobility comments: up in chair after  OT ?  ? ?Transfers ?Overall transfer level: Needs assistance ?  ?Transfers: Sit to/from Stand ?Sit to Stand: Min guard ?  ?Step pivot transfers: Min assist ?  ?  ?  ?General transfer comment: up to stand from recliner to [place] for balance, stepping to [facility] with A for lines and to steady with RW ?  ? ?Ambulation/Gait ?Ambulation/Gait assistance: Min assist ?Gait Distance (Feet): 22 Feet ?Assistive device: Rolling walker (2 wheels) ?Gait Pattern/deviations: Step-through pattern, Step-to pattern, Decreased stride length, Wide base of support, Decreased dorsiflexion - right, Decreased dorsiflexion - left, Trunk flexed ?  ?  ?  ?General Gait Details: able to walk to door and back to chair with RW and stopping x 1 to flex ankles due to stiffness ? ? ?Stairs ?  ?  ?  ?  ?  ? ? ?Wheelchair Mobility ?  ? ?Modified Rankin (Stroke Patients Only) ?  ? ? ?  ?Balance Overall balance assessment: Modified Independent ?Sitting-balance support: No upper extremity supported, Feet supported ?Sitting balance-Leahy Scale: Good ?  ?  ?Standing balance support: Reliant on assistive device for balance, Bilateral upper extremity supported ?Standing balance-Leahy Scale: Poor ?  ?  ?  ?  ?  ?  ?  ?  ?  ?  ?  ?  ?  ? ?  ?Cognition Arousal/Alertness: Awake/alert ?Behavior During Therapy: Gina Cherry for tasks assessed/performed ?Overall Cognitive Status: Within Functional Limits for tasks assessed ?  ?  ?  ?  ?  ?  ?  ?  ?  ?  ?  ?  ?  ?  ?  ?  ?  ?  ?  ? ?  ?  Exercises Other Exercises ?Other Exercises: seated ankle DF x 20 ? ?  ?General Comments General comments (skin integrity, edema, etc.): 3L O2 maintained with room ambulation, SpO2 93%; spouse in the room and supportive ?  ?  ? ?Pertinent Vitals/Pain Pain Assessment ?Pain Assessment: No/denies pain  ? ? ?Home Living   ?  ?  ?  ?  ?  ?  ?  ?  ?  ?   ?  ?Prior Function    ?  ?  ?   ? ?PT Goals (current goals can now be found in the care plan section) Progress towards PT goals: Progressing toward  goals ? ?  ?Frequency ? ? ? Min 3X/week ? ? ? ?  ?PT Plan Current plan remains appropriate  ? ? ?Co-evaluation   ?  ?  ?  ?  ? ?  ?AM-PAC PT "6 Clicks" Mobility   ?Outcome Measure ? Help needed turning from your back to your side while in a flat bed without using bedrails?: A Little ?Help needed moving from lying on your back to sitting on the side of a flat bed without using bedrails?: A Little ?Help needed moving to and from a bed to a chair (including a wheelchair)?: A Little ?Help needed standing up from a chair using your arms (e.g., wheelchair or bedside chair)?: A Little ?Help needed to walk in hospital room?: A Little ?Help needed climbing 3-5 steps with a railing? : Total ?6 Click Score: 16 ? ?  ?End of Session Equipment Utilized During Treatment: Gait belt;Oxygen ?Activity Tolerance: Patient tolerated treatment well ?Patient left: in chair;with call bell/phone within reach ?  ?PT Visit Diagnosis: Muscle weakness (generalized) (M62.81);Difficulty in walking, not elsewhere classified (R26.2) ?  ? ? ?Time: 1205-1230 ?PT Time Calculation (min) (ACUTE ONLY): 25 min ? ?Charges:  $Gait Training: 8-22 mins ?$Therapeutic Exercise: 8-22 mins          ?          ? ?Gina Cherry, PT ?Acute Rehabilitation Services ?[id number] ?Office:[phone] ?12/14/2021 ? ? ? ?Gina Cherry ?12/14/2021, 1:16 PM ? ?

## 2021-12-15 DIAGNOSIS — J189 Pneumonia, unspecified organism: Secondary | ICD-10-CM | POA: Diagnosis not present

## 2021-12-15 DIAGNOSIS — E440 Moderate protein-calorie malnutrition: Secondary | ICD-10-CM | POA: Diagnosis not present

## 2021-12-15 LAB — GLUCOSE, CAPILLARY
Glucose-Capillary: 167 mg/dL — ABNORMAL HIGH (ref 70–99)
Glucose-Capillary: 122 mg/dL — ABNORMAL HIGH (ref 70–99)
Glucose-Capillary: 144 mg/dL — ABNORMAL HIGH (ref 70–99)
Glucose-Capillary: 103 mg/dL — ABNORMAL HIGH (ref 70–99)
Glucose-Capillary: 113 mg/dL — ABNORMAL HIGH (ref 70–99)

## 2021-12-15 MED ORDER — ENSURE MAX PROTEIN PO LIQD
11.0000 [oz_av] | Freq: Two times a day (BID) | ORAL | Status: DC
  Filled 2021-12-15: qty 330

## 2021-12-15 MED ORDER — SODIUM CHLORIDE 0.9 % IV SOLN
250.0000 mL | INTRAVENOUS | Status: DC
  Administered 2021-12-15: 250 mL via INTRAVENOUS

## 2021-12-15 MED ORDER — LACTATED RINGERS IV BOLUS
1000.0000 mL | Freq: Once | INTRAVENOUS | Status: AC
  Administered 2021-12-15: 1000 mL via INTRAVENOUS

## 2021-12-15 NOTE — Progress Notes (Signed)
Inpatient Rehab Admissions Coordinator:  ? ?Awaiting determination from [facility] regarding CIR prior auth request.  ? ?Estill Dooms, PT, DPT ?Admissions Coordinator ?[phone] ?12/15/21  ?10:21 AM ? ?

## 2021-12-15 NOTE — Progress Notes (Signed)
? ?Transfer note: Gina Cherry is a 66 year old female who initially presented on 2/13 for shortness of breath.  She subsequently required high flow nasal cannula and ICU transfer.  She had worsening respiratory status requiring mechanical intubation and pressor support.  She was found to have ARDS and Legionella pneumonia treated with Levaquin.  She was successfully extubated on 2/25 and weaned to [place]. Transferred out of the ICU on 2/27. ? ?Subjective: No acute overnight events.  Patient reports feeling well this morning.  Denies any pain or complaints at this time. She looks forward to going to CIR, but is concerned about the amount of time she will spend there. She has been tolerating oral intake well, and is agreeable to removing Cortrak today.  ? ? ?Objective: ? ?Vital signs in last 24 hours: ?Vitals:  ? 12/14/21 1925 12/14/21 2330 12/15/21 0437 12/15/21 0500  ?BP: (!) 100/49 113/62 123/61   ?Pulse: (!) 108 90 83   ?Resp: 15 16 16    ?Temp: 98.1 ?F (36.7 ?C) 98.7 ?F (37.1 ?C) 98.4 ?F (36.9 ?C)   ?TempSrc: Oral Oral Oral   ?SpO2: 96% 91% 96%   ?Weight:    45.8 kg  ?Height:      ? ?Physical Exam ?General: alert, appears stated age, in no acute distress, ill-appearing, cachectic, Cortrak in place ?HEENT: Normocephalic, atraumatic, perioral blisters in different stages of healing with no intraoral lesions, EOM intact, conjunctiva normal ?CV: Regular rate and rhythm, no murmurs rubs or gallops ?Pulm: Breathing comfortably on 2L HFNC initially; turned off and still breathing comfortably satting 88-96%, clear to auscultation bilaterally ?Abdomen: Soft, nondistended, bowel sounds present, no tenderness to palpation ?MSK: No lower extremity edema ?Skin: Warm and dry; decreased skin turgor of bilateral LE mildly improved today. ?Neuro: Alert and oriented x3  ? ?Assessment/Plan: ? ?Principal Problem: ?  CAP (community acquired pneumonia) ?Active Problems: ?  Septic shock ([place]) ?  Weakness ?  Acute respiratory  failure ([place]) ?  Elevated LFTs ?  Tobacco abuse ?  Risk for falls ?  Protein-calorie malnutrition, severe ?  Takotsubo cardiomyopathy ?  Malnutrition of moderate degree ?  Pressure injury of skin ? ?Ms. Haris is a 66 year old female who presented with community-acquired Legionella pneumonia and ARDS complicated by shock and stress cardiomyopathy requiring ICU transfer, intubation and pressor support.  Patient was successfully extubated on 2/25 and transferred out of the ICU on 2/27. ? ?Acute hypoxic respiratory failure due to Legionella pneumonia in setting of ?COPD, improving. ?Patient was intubated 2/15 to 2/25.  Successfully extubated and doing well today on 3 L high flow nasal cannula.  She completed a 10-day course of levofloxacin from 2/17-2/26.  Oxygen needs to continue to improve. ?-Patient successfully weaned off HFNC; sat >88% on RA. ?-Prednisone course complete ?-Continue Breo and Incruse ?-As needed DuoNeb ? 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  WMA pattern consistent with stress-induced cardiomyopathy.  Repeat echo on 2/20 showed improvement of EF to 65 to 70%.  Patient was diuresed aggressively with IV Lasix and Diamox over the weekend.  Now on 12.5 mg spironolactone and metoprolol 25 mg twice daily.  Patient deferred further work-up with cath.  Heart failure to follow-up outpatient and reassess. ?-Continue spironolactone and metoprolol ? ?Moderate malnutrition ?Underweight ?Post-extubation dysphagia ?Tube feeds were initiated on 2/16.  Cortrak tube placed 2/22.  Patient passed swallow  evaluation on 2/27 and diet was advanced, though she is currently requiring nectar thick liquids.  She is tolerating this well.  Cortrak remains in place.  Patient states that her appetite is improving and she is tolerating oral intake well. She is now agreeable and  comfortable with removing Cortrak. ?- Appreciate RD assistance ?-Continue supplementation and encourage oral intake ?-- Cortrak to be removed today ? ?Dehydration 2/2 poor PO fluid intake ?Decreased skin turgor of BLE.  ?-- Continuous IVF d/c; fluid bolus PRN. ?-- Encouraged to increase PO intake as tolerated ? ?Transaminitis ?Stable.  Thought to be secondary to shock versus EtOH/steatosis.  Will continue to monitor.  Recommend outpatient work-up by PCP if it fails to resolve. ? ?Hypokalemia, resolved ?  ?Microcytic anemia ?Stool occult was positive on admission.  Hemoglobin has remained stable.  Recommend outpatient colonoscopy after discharge.  ?  ?Tobacco use disorder ?-Continue nicotine patches ?-Encourage cessation ? ?Sacral DTI stage I ?-Frequent repositioning.  Heels have cushion dressings applied, no tenderness. ? ?Physical deconditioning ?Patient will need ongoing acute rehabilitation after discharge.  PT OT recommending acute inpatient rehab (3 hours/day). She is eager. Insurance auth pending ? ?Prior to Admission Living Arrangement: Home ?Anticipated Discharge Location: CIR  ?Barriers to Discharge: Medical improvement ?Dispo: Anticipated discharge pending insurance authorization ? ?Rosezetta Schlatter, MD ?12/15/2021, 6:54 AM ?PGY-1 ?Internal Medicine/Psych Resident ?Pager: [phone] ?After 5pm on weekdays and 1pm on weekends: On Call pager [phone] ? ?

## 2021-12-15 NOTE — Progress Notes (Signed)
Mobility Specialist: Progress Note ? ? 12/15/21 1253  ?Mobility  ?Activity Ambulated with assistance in hallway  ?Level of Assistance Minimal assist, patient does 75% or more  ?Assistive Device Front wheel walker  ?Distance Ambulated (ft) 96 ft ?(56'+20'+20')  ?Activity Response Tolerated well  ?$Mobility charge 1 Mobility  ? ?During Mobility: 87-88% SpO2 ?Post-Mobility: 91-92% SpO2 ? ?Pt received in bed and agreeable to mobility. Mod I with bed mobility and minA to stand. Pt desat to 87% SpO2 on RA during ambulation but was able to quickly recover after seated break. Pt to BR, void successful, then brushed her teeth. C/o back pain during ambulation, no rating given. Pt back in bed with call bell at her side and family present in the room.  ? ?Harrell Gave Kedric Bumgarner ?Mobility Specialist ?Mobility Specialist [place]: [phone] ?Mobility Specialist [place]: [phone] ? ?

## 2021-12-15 NOTE — Progress Notes (Signed)
Speech Language Pathology Treatment: Dysphagia  ?Patient Details ?Name: Gina Cherry ?MRN: [id number] ?DOB: 06-08-1956 ?Today's Date: 12/15/2021 ?Time: 6384-6659 ?SLP Time Calculation (min) (ACUTE ONLY): 14 min ? ?Assessment / Plan / Recommendation ?Clinical Impression ? Pt was seen for dysphagia treatment. Pt's vocal quality remains hoarse, suggesting continued vocal fold insufficiency, but she stated that it is significantly improved compared to when she was last seen on 2/28 and some normal vocal quality was noted within utterances. Regular water was noted at bedside, and pt reported that she has been tolerating thin water with meals without overt s/sx of aspiration. Coughing was occasionally noted with intake of peach cobbler, but this was no more frequent than at baseline. She tolerated individual and consecutive swallows of thin liquids via straw without overt s/sx of aspiration. Oral phase was WNL. Pt's diet will be advanced to regular texture solids and thin liquids. SLP will continue to follow pt.   ?  ?HPI HPI: Pt is 66 yo female who presents with 5 day h/o cough, SOB, diarrhea, and weakness that resulted in a fall with head lacerations, CT (-) for ICH. Pt found to have sepsis due to RLL PNA. Pt with worsening respiratory status on 2/15 and VDRF, intubated.  PMH: smoker, EtOH abuse.  Extubated 2/25. ?  ?   ?SLP Plan ? Continue with current plan of care ? ?  ?  ?Recommendations for follow up therapy are one component of a multi-disciplinary discharge planning process, led by the attending physician.  Recommendations may be updated based on patient status, additional functional criteria and insurance authorization. ?  ? ?Recommendations  ?Diet recommendations: Regular;Thin liquid ?Liquids provided via: Cup;Straw ?Medication Administration: Whole meds with liquid (or puree) ?Supervision: Patient able to self feed;Full supervision/cueing for compensatory strategies ?Compensations: Slow rate;Small  sips/bites ?Postural Changes and/or Swallow Maneuvers: Seated upright 90 degrees  ?   ?    ?   ? ? ? ? Oral Care Recommendations: Oral care BID ?Follow Up Recommendations: Acute inpatient rehab (3hours/day) ?Assistance recommended at discharge: Intermittent Supervision/Assistance ?SLP Visit Diagnosis: Dysphagia, oropharyngeal phase (R13.12) ?Plan: Continue with current plan of care ? ? ? ? ?  ?  ?Nyaisha Simao I. Vear Clock, MS, CCC-SLP ?Acute Rehabilitation Services ?Office number [phone] ?Pager [phone] ? ? ?Scheryl Marten ? ?12/15/2021, 3:19 PM ? ? ?

## 2021-12-16 DIAGNOSIS — J440 Chronic obstructive pulmonary disease with acute lower respiratory infection: Secondary | ICD-10-CM

## 2021-12-16 DIAGNOSIS — A481 Legionnaires' disease: Secondary | ICD-10-CM | POA: Diagnosis not present

## 2021-12-16 DIAGNOSIS — J189 Pneumonia, unspecified organism: Secondary | ICD-10-CM | POA: Diagnosis not present

## 2021-12-16 DIAGNOSIS — L89151 Pressure ulcer of sacral region, stage 1: Secondary | ICD-10-CM

## 2021-12-16 DIAGNOSIS — D509 Iron deficiency anemia, unspecified: Secondary | ICD-10-CM

## 2021-12-16 DIAGNOSIS — J9601 Acute respiratory failure with hypoxia: Secondary | ICD-10-CM | POA: Diagnosis not present

## 2021-12-16 DIAGNOSIS — R636 Underweight: Secondary | ICD-10-CM

## 2021-12-16 LAB — GLUCOSE, CAPILLARY
Glucose-Capillary: 128 mg/dL — ABNORMAL HIGH (ref 70–99)
Glucose-Capillary: 53 mg/dL — ABNORMAL LOW (ref 70–99)
Glucose-Capillary: 56 mg/dL — ABNORMAL LOW (ref 70–99)
Glucose-Capillary: 62 mg/dL — ABNORMAL LOW (ref 70–99)
Glucose-Capillary: 76 mg/dL (ref 70–99)
Glucose-Capillary: 82 mg/dL (ref 70–99)
Glucose-Capillary: 85 mg/dL (ref 70–99)
Glucose-Capillary: 87 mg/dL (ref 70–99)

## 2021-12-16 NOTE — Progress Notes (Signed)
Physical Therapy Treatment ?Patient Details ?Name: Gina Cherry ?MRN: [id number] ?DOB: 1956/05/21 ?Today's Date: 12/16/2021 ? ? ?History of Present Illness Pt is 66 yo female who presents with 5 day h/o cough, SOB, diarrhea, and weakness that resulted in a fall with head lacerations, CT (-) for ICH. Pt found to have sepsis due to RLL PNA. Pt with worsening respiratory status on 2/15 and VDRF.  PMH: smoker, EtOH abuse.  Extubated 2/25. ? ?  ?PT Comments  ? ? Patient hopeful to go home instead of rehab.  Session focus on barriers for going home and education including stair negotiation with spouse present and return demonstrated appropriate level of assistance.  Using walker for ambulation and someone to assist for safety.  Also discussed showering which she plans to wait on till better activity tolerance.  She desaturated on RA with negotiating stairs so discussed home O2.  Also educated on use of PRAFO's for continued improvements in ankle AROM.  PT will follow up if not d/c.    ?Recommendations for follow up therapy are one component of a multi-disciplinary discharge planning process, led by the attending physician.  Recommendations may be updated based on patient status, additional functional criteria and insurance authorization. ? ?Follow Up Recommendations ? Home health PT (HHOT) ?  ?  ?Assistance Recommended at Discharge Frequent or constant Supervision/Assistance  ?Patient can return home with the following A little help with walking and/or transfers;Assistance with cooking/housework;Direct supervision/assist for medications management;Assist for transportation;Help with stairs or ramp for entrance;A little help with bathing/dressing/bathroom ?  ?Equipment Recommendations ? Rolling walker (2 wheels);BSC/3in1  ?  ?Recommendations for Other Services   ? ? ?  ?Precautions / Restrictions Precautions ?Precautions: Fall ?Precaution Comments: watch SpO2  ?  ? ?Mobility ? Bed Mobility ?Overal bed mobility: Modified  Independent ?  ?  ?  ?  ?  ?  ?  ?  ? ?Transfers ?Overall transfer level: Needs assistance ?Equipment used: Rolling walker (2 wheels) ?Transfers: Sit to/from Stand, Bed to chair/wheelchair/BSC ?Sit to Stand: Supervision, Min guard ?  ?  ?  ?  ?  ?General transfer comment: up to stand at walker with S, stepping to [facility] with RW and minguard for safety/balance ?  ? ?Ambulation/Gait ?Ambulation/Gait assistance: Min guard ?Gait Distance (Feet): 60 Feet (&40') ?Assistive device: Rolling walker (2 wheels) ?Gait Pattern/deviations: Step-through pattern, Trunk flexed, Decreased stride length ?  ?  ?  ?General Gait Details: ambulating in hallway, needs support for balance with flexed posture still stiffness in calves ? ? ?Stairs ?Stairs: Yes ?Stairs assistance: Min assist ?Stair Management: One rail Left, Step to pattern, Forwards (and spouse HHA) ?Number of Stairs: 3 ?  ? ? ?Wheelchair Mobility ?  ? ?Modified Rankin (Stroke Patients Only) ?  ? ? ?  ?Balance Overall balance assessment: Needs assistance ?Sitting-balance support: No upper extremity supported, Feet supported ?Sitting balance-Leahy Scale: Good ?  ?  ?Standing balance support: Bilateral upper extremity supported ?Standing balance-Leahy Scale: Poor ?Standing balance comment: UE support for balance ?  ?  ?  ?  ?  ?  ?  ?  ?  ?  ?  ?  ? ?  ?Cognition Arousal/Alertness: Awake/alert ?Behavior During Therapy: [facility] for tasks assessed/performed, Anxious ?Overall Cognitive Status: Within Functional Limits for tasks assessed ?  ?  ?  ?  ?  ?  ?  ?  ?  ?  ?  ?  ?  ?  ?  ?  ?  ?  ?  ? ?  ?  Exercises   ? ?  ?General Comments General comments (skin integrity, edema, etc.): on RA throughout, SpO2 91-88%, after stairs 81%, back up with PLB after about a minute to 88%.  Extensive education with pt and spouse in prep for possible d/c home including stairs, safety for fall prevention with walker, assist, footwear, lighting and clear pathways, car transfers and plans for HHPT/OT ?  ?   ? ?Pertinent Vitals/Pain Pain Assessment ?Pain Score: 0-No pain  ? ? ?Home Living   ?  ?  ?  ?  ?  ?  ?  ?  ?  ?   ?  ?Prior Function    ?  ?  ?   ? ?PT Goals (current goals can now be found in the care plan section) Progress towards PT goals: Progressing toward goals ? ?  ?Frequency ? ? ? Min 3X/week ? ? ? ?  ?PT Plan Discharge plan needs to be updated  ? ? ?Co-evaluation   ?  ?  ?  ?  ? ?  ?AM-PAC PT "6 Clicks" Mobility   ?Outcome Measure ? Help needed turning from your back to your side while in a flat bed without using bedrails?: A Little ?Help needed moving from lying on your back to sitting on the side of a flat bed without using bedrails?: A Little ?Help needed moving to and from a bed to a chair (including a wheelchair)?: A Little ?Help needed standing up from a chair using your arms (e.g., wheelchair or bedside chair)?: A Little ?Help needed to walk in hospital room?: A Little ?Help needed climbing 3-5 steps with a railing? : A Little ?6 Click Score: 18 ? ?  ?End of Session Equipment Utilized During Treatment: Gait belt ?Activity Tolerance: Patient tolerated treatment well ?Patient left: in bed;with call bell/phone within reach;with family/visitor present ?  ?  ?  ? ? ?Time: 8115-7262 ?PT Time Calculation (min) (ACUTE ONLY): 42 min ? ?Charges:  $Gait Training: 8-22 mins ?$Therapeutic Activity: 8-22 mins ?$Self Care/Home Management: 8-22          ?          ? ?Sheran Lawless, PT ?Acute Rehabilitation Services ?Pager:[phone] ?Office:[phone] ?12/16/2021 ? ? ? ?Gina Cherry ?12/16/2021, 1:56 PM ? ?

## 2021-12-16 NOTE — Progress Notes (Signed)
Inpatient Rehab Admissions Coordinator:  ? ?Still awaiting determination from [facility].  Will continue to follow.  ? ?Estill Dooms, PT, DPT ?Admissions Coordinator ?[phone] ?12/16/21  ?3:28 PM ? ?

## 2021-12-16 NOTE — Progress Notes (Signed)
SATURATION QUALIFICATIONS: (This note is used to comply with regulatory documentation for home oxygen) ? ?Patient Saturations on Room Air at Rest = 90% ? ?Patient Saturations on Room Air while Ambulating = 81% ? ?Patient Saturations on 2 Liters of oxygen while Ambulating = 95% ? ?Please briefly explain why patient needs home oxygen: ?Patient hypoxic on room air with activity.  ? ?Sheran Lawless, PT ?Acute Rehabilitation Services ?[phone] ?Office:[phone] ?12/16/2021 ? ?

## 2021-12-16 NOTE — H&P (Signed)
Physical Medicine and Rehabilitation Admission H&P    Chief Complaint  Patient presents with   Functional decline due to multiple medical issues.     HPI:  Gina Cherry is a 66 year old female with history of COPD w/ tobacco use,  osteoporosis who was admitted on 11/28/21 with reports of multiple falls, SOB w/cough, generalized weakness and diarrhea. She was noted to have bilateral CAP with leukocytosis and elevated LFTs with hypokalemia.  She was started on broad-spectrum antibiotics but developed ARDS due to septic shock and was intubated and required proning due to worsening of ARDS with face discoloration.  She was found to have Legionella PNA and antibiotics narrowed to levofloxacin.  She developed acute systolic heart failure from Takotsubo cardiomyopathy with EF 30 to 35% in setting of acute sepsis.   Dr. Gala Romney consulted to manage fluid overload and repeat echo 2/20 showed improvement in EF to 65-70%.  Elevated trops felt to be due to demand ischemia and question of After recovery.  Metoprolol was added to manage sinus tachycardia.  She tolerated extubation and was weaned off pressors.  MBS on 02/27 and she was started on regular diet with nectar liquids was advanced to thin liquids  as swallow function improved.  Respiratory status improving and she has been weaned down to 2 L per [place] with activity.  She continues to be limited by weakness and balance deficits due to recent illness.  CIR was recommended due to functional decline.   Review of Systems  Constitutional:  Negative for chills and fever.  HENT:  Negative for ear pain and hearing loss.   Eyes:  Negative for blurred vision and double vision.  Respiratory:  Positive for cough and shortness of breath.   Cardiovascular:  Negative for chest pain and leg swelling.  Gastrointestinal:  Negative for nausea and vomiting.  Genitourinary:  Negative for dysuria and frequency.  Musculoskeletal:  Negative for myalgias.  Skin:   Negative for rash.  Neurological:  Positive for weakness. Negative for dizziness and headaches.  Psychiatric/Behavioral:  Negative for depression and substance abuse.     Past Medical History:  Diagnosis Date   COPD (chronic obstructive pulmonary disease) (HCC)    Osteoporosis    Underweight     History reviewed. No pertinent surgical history.   No family history on file.   Social History: Lives with boyfriend. Per  reports that she has been smoking cigarettes. She has been smoking an average of .5 packs per day. She does not have any smokeless tobacco history on file. She reports current alcohol use--2 drinks per day. . She reports that she does not use drugs.   Allergies  Allergen Reactions   Codeine Other (See Comments)    Unknown reaction (possibly sick stomach)    Medications Prior to Admission  Medication Sig Dispense Refill   acetaminophen (TYLENOL) 500 MG tablet Take 500 mg by mouth every 6 (six) hours as needed for headache (pain).     alendronate (FOSAMAX) 70 MG tablet Take 70 mg by mouth every Saturday.     Cholecalciferol (VITAMIN D3) 50 MCG (2000 UT) TABS Take 2,000 Units by mouth at bedtime.     ibuprofen (ADVIL) 200 MG tablet Take 200 mg by mouth every 6 (six) hours as needed for headache (pain).     Multiple Vitamin (MULTIVITAMIN WITH MINERALS) TABS tablet Take 1 tablet by mouth at bedtime.     Pramoxine-HC (HYDROCORTISONE ACE-PRAMOXINE) 2.5-1 % CREA Apply 1 application topically  2 (two) times daily as needed (hemorrhoids).        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 They are both retired. Just got back from vacation at [facility]   Functional History: Prior Function Prior Level of Function : Independent/Modified Independent, Driving  Functional Status:  Mobility: Bed Mobility Overal bed mobility: Needs Assistance Bed Mobility: Supine to Sit Rolling: Mod assist, Max assist, +2 for physical assistance Supine to sit: Supervision, HOB elevated Sit to supine: Supervision General bed mobility comments: up in chair after OT Transfers Overall transfer level: Needs assistance Equipment used: Rolling walker (2 wheels) Transfers: Sit to/from Stand Sit to Stand: Min guard Bed to/from chair/wheelchair/BSC transfer type:: Step pivot Stand pivot transfers: Min assist Step pivot transfers: Min assist  Lateral/Scoot Transfers: Mod assist, +2 physical assistance General transfer comment: up to stand from recliner to RW Assist for balance, stepping to [facility] with A for lines and to steady with RW Ambulation/Gait Ambulation/Gait assistance: Min assist Gait Distance (Feet): 22 Feet Assistive device: Rolling walker (2 wheels) Gait Pattern/deviations: Step-through pattern, Step-to pattern, Decreased stride length, Wide base of support, Decreased dorsiflexion - right, Decreased dorsiflexion - left, Trunk flexed General Gait Details: able to walk to door and back to chair with RW and stopping x 1 to flex ankles due to stiffness Pre-gait activities: pt marching in place, reduced foot clearance bilaterally, posterior lean    ADL: ADL Overall ADL's : Needs assistance/impaired Eating/Feeding: NPO Grooming: Set up, Sitting Upper Body Bathing: Minimal assistance, Sitting Lower Body Bathing: Moderate assistance, Sitting/lateral leans Upper Body Dressing : Minimal assistance, Sitting Lower Body Dressing: Moderate assistance, Minimal assistance, Sit to/from stand Lower Body Dressing Details (indicate cue type and reason): Pt can now cross her legs  to get to her feet while seated EOB--can doff but can't donn socks as of yet Toilet Transfer: Stand-pivot, Rolling walker (2 wheels), Minimal assistance Toileting- Clothing Manipulation and Hygiene: Maximal assistance Functional mobility during ADLs: Moderate assistance, +2 for safety/equipment General ADL Comments: easily fatigues; complains of dizziness  Cognition: Cognition Overall Cognitive Status: Within Functional Limits for tasks assessed Orientation Level: Oriented X4 Cognition Arousal/Alertness: Awake/alert Behavior During Therapy: WFL for tasks assessed/performed Overall Cognitive Status: Within Functional Limits for tasks assessed Area of Impairment: Safety/judgement, Problem solving Safety/Judgement: Decreased awareness of safety, Decreased awareness of deficits Problem Solving: Slow processing General Comments: following commands and interactive, still reaches to her mouth, but not always trying to pull tubes Difficult to assess due to: Intubated, Impaired communication  Physical Exam: Blood pressure 128/64, pulse 95, temperature 98 F (36.7 C), temperature source Oral, resp. rate 18, height 5\' 4"  (1.626 m), weight 46.1 kg, SpO2 (!) 88 %. Physical Exam Constitutional:      General: She is not in acute distress.    Appearance: She is not ill-appearing.  HENT:     Right Ear: External ear normal.     Left Ear: External ear normal.     Nose: Nose normal.     Mouth/Throat:     Mouth: Mucous membranes are moist.  Eyes:     Pupils: Pupils are equal, round, and reactive to light.  Cardiovascular:     Rate and  Rhythm: Normal rate and regular rhythm.     Heart sounds: No murmur heard.   No gallop.  Pulmonary:     Effort: Pulmonary effort is normal. No respiratory distress.     Breath sounds: No wheezing, rhonchi or rales.  Abdominal:     General: Abdomen is flat. There is no distension.     Palpations: Abdomen is soft.     Tenderness: There is no abdominal tenderness.   Musculoskeletal:        General: No swelling or tenderness.     Cervical back: Normal range of motion.     Right lower leg: No edema.     Left lower leg: No edema.  Skin:    General: Skin is warm and dry.     Comments: Small stg 1 sacrum. Skin tears bilateral lower legs  Neurological:     General: No focal deficit present.     Mental Status: She is oriented to person, place, and time.     Cranial Nerves: No cranial nerve deficit.     Sensory: No sensory deficit.     Comments: Motor 4-5/5 UE, 4/5 LE prox to distal. No abnl tone. Normal coordination.   Psychiatric:        Mood and Affect: Mood normal.        Behavior: Behavior normal.        Thought Content: Thought content normal.        Judgment: Judgment normal.    Results for orders placed or performed during the hospital encounter of 11/28/21 (from the past 48 hour(s))  Glucose, capillary     Status: Abnormal   Collection Time: 12/14/21 12:11 PM  Result Value Ref Range   Glucose-Capillary 114 (H) 70 - 99 mg/dL    Comment: Glucose reference range applies only to samples taken after fasting for at least 8 hours.  Glucose, capillary     Status: Abnormal   Collection Time: 12/14/21  3:41 PM  Result Value Ref Range   Glucose-Capillary 191 (H) 70 - 99 mg/dL    Comment: Glucose reference range applies only to samples taken after fasting for at least 8 hours.  Glucose, capillary     Status: Abnormal   Collection Time: 12/14/21  7:53 PM  Result Value Ref Range   Glucose-Capillary 154 (H) 70 - 99 mg/dL    Comment: Glucose reference range applies only to samples taken after fasting for at least 8 hours.  Glucose, capillary     Status: Abnormal   Collection Time: 12/14/21 11:38 PM  Result Value Ref Range   Glucose-Capillary 150 (H) 70 - 99 mg/dL    Comment: Glucose reference range applies only to samples taken after fasting for at least 8 hours.  Glucose, capillary     Status: Abnormal   Collection Time: 12/15/21  4:17 AM  Result  Value Ref Range   Glucose-Capillary 167 (H) 70 - 99 mg/dL    Comment: Glucose reference range applies only to samples taken after fasting for at least 8 hours.  Glucose, capillary     Status: Abnormal   Collection Time: 12/15/21  7:39 AM  Result Value Ref Range   Glucose-Capillary 122 (H) 70 - 99 mg/dL    Comment: Glucose reference range applies only to samples taken after fasting for at least 8 hours.  Glucose, capillary     Status: Abnormal   Collection Time: 12/15/21 11:18 AM  Result Value Ref Range   Glucose-Capillary 144 (H) 70 - 99  mg/dL    Comment: Glucose reference range applies only to samples taken after fasting for at least 8 hours.  Glucose, capillary     Status: Abnormal   Collection Time: 12/15/21  4:00 PM  Result Value Ref Range   Glucose-Capillary 103 (H) 70 - 99 mg/dL    Comment: Glucose reference range applies only to samples taken after fasting for at least 8 hours.  Glucose, capillary     Status: Abnormal   Collection Time: 12/15/21  8:21 PM  Result Value Ref Range   Glucose-Capillary 113 (H) 70 - 99 mg/dL    Comment: Glucose reference range applies only to samples taken after fasting for at least 8 hours.  Glucose, capillary     Status: None   Collection Time: 12/16/21 12:12 AM  Result Value Ref Range   Glucose-Capillary 85 70 - 99 mg/dL    Comment: Glucose reference range applies only to samples taken after fasting for at least 8 hours.  Glucose, capillary     Status: None   Collection Time: 12/16/21  3:16 AM  Result Value Ref Range   Glucose-Capillary 87 70 - 99 mg/dL    Comment: Glucose reference range applies only to samples taken after fasting for at least 8 hours.  Glucose, capillary     Status: None   Collection Time: 12/16/21  7:59 AM  Result Value Ref Range   Glucose-Capillary 82 70 - 99 mg/dL    Comment: Glucose reference range applies only to samples taken after fasting for at least 8 hours.   No results found.    Blood pressure 128/64, pulse  95, temperature 98 F (36.7 C), temperature source Oral, resp. rate 18, height 5\' 4"  (1.626 m), weight 46.1 kg, SpO2 (!) 88 %.  Medical Problem List and Plan: 1. Functional deficits secondary to debility after Acute respiratory failure d/t legionella pneumonia  -patient may shower  -ELOS/Goals: 5 days, mod I 2.  Antithrombotics: -DVT/anticoagulation:  Pharmaceutical: Lovenox  -antiplatelet therapy: asa 3. Pain Management: tylenol prn.  4. Mood: LCSW to follow for evaluation and support.   -antipsychotic agents: N/A 5. Neuropsych: This patient is capable of making decisions on her own behalf. 6. Skin/Wound Care: Maintain adequate nutrition and hydration status.   --PRAFO's for pressure-relief measures for heels, lower legs  -OOB 7. Fluids/Electrolytes/Nutrition: monitor I/O. Check CMET on 03/06 8.  Acute systolic heart failure: initial ECHO with EF 30-45% but repeat demonstrated EF 65-70%. -Monitor weights daily.   -Low-salt diet.   -Monitor for signs of overload  --on ASA, metoprolol and low dose aldactone.   -outpt cards follow up 9.  Legionella PNA: Hypoxic respiratory resolved and has completed Levaquin for treatment. 10.  Stress induced hyperglycemia: Hemoglobin A1c 5.5. Likely due to tube feeds.  -- Continue to monitor blood sugars ac/hs  -- Continue insulin glargine with SSI and wean off as activity improve.  11.  COPD: Continue Incruse and Breo. Wean oxygen as table to keep sats > 88%  12. Acute renal failure: avoid nephrotoxic meds. SCr ranging 3.4 to 0.41 despite fluids.  --recheck 03/06 . 13. Abnormal LFTs: Likely due to alcohol use and shock-->improving.  -- Continue to trend for recovery.       05/06, PA-C 12/16/2021

## 2021-12-16 NOTE — Progress Notes (Addendum)
? ?Transfer note: Gina Cherry is a 66 year old female who initially presented on 2/13 for shortness of breath.  She subsequently required high flow nasal cannula and ICU transfer.  She had worsening respiratory status requiring mechanical intubation and pressor support.  She was found to have ARDS and Legionella pneumonia treated with Levaquin.  She was successfully extubated on 2/25 and weaned to [place]. Transferred out of the ICU on 2/27. ? ?Subjective: No acute overnight events.  Patient reports feeling well this morning.  Denies any pain or complaints at this time. She inquires about the possibility of HHPT, and overall feels ready to go home if the insurance Josem Kaufmann will take a while. Has support in place at home with fiancee and family members who live nearby.  ? ? ?Objective: ? ?Vital signs in last 24 hours: ?Vitals:  ? 12/15/21 1943 12/16/21 0008 12/16/21 0312 12/16/21 0500  ?BP: 128/67 117/72 121/65   ?Pulse: 99 74 85   ?Resp: 17 18 19    ?Temp: 98 ?F (36.7 ?C) 98 ?F (36.7 ?C) 98 ?F (36.7 ?C)   ?TempSrc: Oral Oral Oral   ?SpO2: 95% 93% 91%   ?Weight:    46.1 kg  ?Height:      ? ?Physical Exam ?General: alert, appears stated age, in no acute distress, appearing brighter and more upbeat, cachectic ?HEENT: Normocephalic, atraumatic, perioral blisters in different stages of healing with no intraoral lesions, EOM intact, conjunctiva normal ?CV: Regular rate and rhythm, no murmurs rubs or gallops ?Pulm: Breathing comfortably with [place] halfway in nose. Clear to auscultation bilaterally ?Abdomen: Soft, nondistended, bowel sounds present, no tenderness to palpation ?MSK: No lower extremity edema ?Skin: Warm and dry; decreased skin turgor of bilateral LE improved more today. ?Neuro: Alert and oriented x3  ? ?Assessment/Plan: ? ?Principal Problem: ?  CAP (community acquired pneumonia) ?Active Problems: ?  Septic shock ([place]) ?  Weakness ?  Acute respiratory failure ([place]) ?  Elevated LFTs ?  Tobacco abuse ?  Risk for  falls ?  Protein-calorie malnutrition, severe ?  Takotsubo cardiomyopathy ?  Malnutrition of moderate degree ?  Pressure injury of skin ? ?Gina Cherry is a 66 year old female who presented with community-acquired Legionella pneumonia and ARDS complicated by shock and stress cardiomyopathy requiring ICU transfer, intubation and pressor support.  Patient was successfully extubated on 2/25 and transferred out of the ICU on 2/27. She is now medically stable to discharge to CIR or home with HHPT and DME orders for O2 with ambulation, bedside commode, and walker.  ? ?Acute hypoxic respiratory failure due to Legionella pneumonia in setting of ?COPD, improving. ?Patient was intubated 2/15 to 2/25.  Successfully extubated and doing well today on 3 L high flow nasal cannula.  She completed a 10-day course of levofloxacin from 2/17-2/26.  Oxygen needs improved significantly; patient doing well on room air. ?-Patient with [place] in place after getting inhalers, but otherwise not needed at rest, per PT O2 saturation study. 2L O2 needed with ambulation.  ?-Prednisone course complete ?-Continue Breo and Incruse ?-As needed DuoNeb ? 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  WMA pattern consistent with stress-induced cardiomyopathy.  Repeat echo on 2/20 showed improvement of EF to 65 to 70%.  Patient was diuresed aggressively with IV Lasix and Diamox over the weekend.  Now on 12.5 mg spironolactone and metoprolol 25 mg twice daily.  Patient deferred further work-up with cath.  Heart failure to follow-up outpatient and reassess. ?-Continue spironolactone and metoprolol ? ?Moderate malnutrition ?Underweight ?Post-extubation dysphagia ?Tube feeds were initiated on 2/16.  Cortrak tube placed 2/22.  Patient passed swallow evaluation on 2/27 and diet was advanced, now to solids and thin  liquids. Patient states that her appetite is improving and she is tolerating oral intake well. Cortrak removed 3/2. ?- Appreciate RD assistance ?-Continue to encourage oral intake ? ? ?Dehydration 2/2 poor PO fluid intake ?Decreased skin turgor of BLE, improving.  ?-- Continuous IVF d/c; fluid bolus PRN. ?-- Encouraged to increase PO intake as tolerated ? ?Transaminitis ?Likely secondary to tube feedings which have been discontinued.  Asymptomatic, monitor periodically. ? ?Hypokalemia, resolved ?  ?Microcytic anemia ?Stool occult was positive on admission.  Hemoglobin has remained stable.  Recommend outpatient colonoscopy after discharge.  ?  ?Tobacco use disorder ?-Continue nicotine patches ?-Encourage cessation ? ?Sacral DTI stage I ?-Frequent repositioning.  Heels have cushion dressings applied, no tenderness. ? ?Physical deconditioning ?Patient will need ongoing acute rehabilitation after discharge.  PT OT recommending acute inpatient rehab (3 hours/day). Insurance auth pending ? ?Prior to Admission Living Arrangement: Home ?Anticipated Discharge Location: CIR vs. Home with HHPT ?Barriers to Discharge: None; patient medically stable to d/c ?Dispo: Anticipated discharge pending insurance authorization ? ?Gina Schlatter, MD ?12/16/2021, 6:24 AM ?PGY-1 ?Internal Medicine/Psych Resident ?Pager: [phone] ?After 5pm on weekdays and 1pm on weekends: On Call pager [phone] ? ?

## 2021-12-16 NOTE — Progress Notes (Signed)
Speech Language Pathology Treatment: Dysphagia  ?Patient Details ?Name: Gina Cherry ?MRN: [id number] ?DOB: 1956-08-31 ?Today's Date: 12/16/2021 ?Time: 6701-4103 ?SLP Time Calculation (min) (ACUTE ONLY): 16 min ? ?Assessment / Plan / Recommendation ?Clinical Impression ? Pt and RN report good tolerance of regular texture diet with thin liquid. Pt has had good PO intake.  Pt took straw sips of thin liquid with no clinical s/s of aspiration. With regular solid saltine cracker, there was dry cough x1.  Pt was also noted to cough in absence of POs. Pt's vocal quality remains slightly hoarse, but she and her husband report improvement in vocal quality and strength daily.  ? ?Recommend continuing regular texture diet with thin liquids.  Pt has no further ST needs at this time.  SLP will sign off. ?  ?HPI HPI: Pt is 65 yo female who presents with 5 day h/o cough, SOB, diarrhea, and weakness that resulted in a fall with head lacerations, CT (-) for ICH. Pt found to have sepsis due to RLL PNA. Pt with worsening respiratory status on 2/15 and VDRF, intubated.  PMH: smoker, EtOH abuse.  Extubated 2/25. ?  ?   ?SLP Plan ? All goals met ? ?  ?  ?Recommendations for follow up therapy are one component of a multi-disciplinary discharge planning process, led by the attending physician.  Recommendations may be updated based on patient status, additional functional criteria and insurance authorization. ?  ? ?Recommendations  ?Diet recommendations: Regular;Thin liquid ?Liquids provided via: Cup;Straw ?Medication Administration: Whole meds with liquid ?Supervision: Patient able to self feed ?Compensations: Slow rate;Small sips/bites ?Postural Changes and/or Swallow Maneuvers: Seated upright 90 degrees  ?   ?    ?   ? ? ? ? Oral Care Recommendations: Oral care BID ?Follow Up Recommendations: Acute inpatient rehab (3hours/day) ?Assistance recommended at discharge: Intermittent Supervision/Assistance ?SLP Visit Diagnosis: Dysphagia,  oropharyngeal phase (R13.12) ?Plan: All goals met ? ? ? ? ?  ?  ? ? ?Tariah Transue E Analaya Hoey , MA, CCC-SLP ?Acute Rehabilitation Services ?Office: [phone]  ? ?12/16/2021, 12:30 PM ?

## 2021-12-17 LAB — GLUCOSE, CAPILLARY
Glucose-Capillary: 118 mg/dL — ABNORMAL HIGH (ref 70–99)
Glucose-Capillary: 83 mg/dL (ref 70–99)
Glucose-Capillary: 84 mg/dL (ref 70–99)
Glucose-Capillary: 89 mg/dL (ref 70–99)
Glucose-Capillary: 92 mg/dL (ref 70–99)
Glucose-Capillary: 96 mg/dL (ref 70–99)
Glucose-Capillary: 98 mg/dL (ref 70–99)

## 2021-12-17 NOTE — Progress Notes (Signed)
Inpatient Rehab Admissions Coordinator:  ? ?I received insurance auth for CIR. As long as Pt. Remains medically stable, I can admit her to CIR tomorrow. Pt. And acute MD aware. ? ?Megan Salon, MS, CCC-SLP ?Rehab Admissions Coordinator  ?[phone] (celll) ?[phone] (office) ? ?

## 2021-12-17 NOTE — Progress Notes (Signed)
Mobility Specialist Progress Note: ? ? 12/17/21 1018  ?Mobility  ?Activity Ambulated with assistance in hallway  ?Level of Assistance Standby assist, set-up cues, supervision of patient - no hands on  ?Assistive Device Front wheel walker  ?Distance Ambulated (ft) 150 ft  ?Activity Response Tolerated well  ?$Mobility charge 1 Mobility  ? ?Pt received on BSC, Pt able to have BM. No complaints of pain, Pt stated she felt a little SOB requiring 2 short standing rest breaks. Left EOB with call bell in reach and all needs met.  ? ?Gina Cherry ?Mobility Specialist ?Primary Phone [phone] ? ?

## 2021-12-17 NOTE — Progress Notes (Signed)
? ?Transfer note: Gina Cherry is a 66 year old female who initially presented on 2/13 for shortness of breath.  She subsequently required high flow nasal cannula and ICU transfer.  She had worsening respiratory status requiring mechanical intubation and pressor support.  She was found to have ARDS and Legionella pneumonia treated with Levaquin.  She was successfully extubated on 2/25 and weaned to [place]. Transferred out of the ICU on 2/27. ? ?Subjective: No acute overnight events. ? ?Evaluated patient at bedside. She reports that she feels better today. Has been eating well since yesterday, finished breakfast this AM. She was able to ambulate with PT yesterday and able to walk down hallway and even climb up several stairs (to simulate her home environment) before returning to room. Discussed CIR vs HH PT and patient and husband, Gina Cherry, agreed that CIR would provide more thorough strength training for her. ? ? ?Objective: ? ?Vital signs in last 24 hours: ?Vitals:  ? 12/17/21 0310 12/17/21 0343 12/17/21 0700 12/17/21 1127  ?BP: 114/60  (!) 144/70 117/67  ?Pulse: 68  80 79  ?Resp: 13  16 17   ?Temp: 98.2 ?F (36.8 ?C)  98 ?F (36.7 ?C) 98 ?F (36.7 ?C)  ?TempSrc: Oral  Oral Oral  ?SpO2: 92%  92% 96%  ?Weight:  46.4 kg    ?Height:      ? ?Physical Exam ?General: thin, frail-appearing elderly female, sitting up in bed, NAD. ?HENT: perioral blisters in different stages of healing. ?CV: normal rate and regular rhythm, no m/r/g. ?Pulm: CTABL, breathing comfortably on RA. ?MSK: no peripheral edema noted. ?Skin: warm and dry, normal skin turgor.  ?Neuro: AAOx3, no focal deficits noted. ? ?Assessment/Plan: ? ?Principal Problem: ?  CAP (community acquired pneumonia) ?Active Problems: ?  Septic shock ([place]) ?  Weakness ?  Acute respiratory failure ([place]) ?  Elevated LFTs ?  Tobacco abuse ?  Risk for falls ?  Protein-calorie malnutrition, severe ?  Takotsubo cardiomyopathy ?  Malnutrition of moderate degree ?  Pressure injury of  skin ? ?Gina Cherry is a 66 year old female who presented with community-acquired Legionella pneumonia and ARDS complicated by shock and stress cardiomyopathy requiring ICU transfer, intubation and pressor support.  Patient was successfully extubated on 2/25 and transferred out of the ICU on 2/27. She is now medically stable for discharge to CIR tomorrow and DME orders for O2 with ambulation, bedside commode, and walker.  ? ?Acute hypoxic respiratory failure due to Legionella pneumonia in setting of ?COPD, improving. ?Patient was intubated 2/15 to 2/25.  Successfully extubated and doing well today on 3 L high flow nasal cannula.  She completed a 10-day course of levofloxacin from 2/17-2/26.  Oxygen needs improved significantly; patient doing well on room air. She is medically stable for discharge to CIR, insurance authorization has gone through, and a bed is available tomorrow. ?-Patient with [place] in place after getting inhalers, but otherwise not needed at rest, per PT O2 saturation study. 2L O2 needed with ambulation.  ?-Prednisone course complete ?-Continue Breo and Incruse ?-As needed DuoNeb ? 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  WMA pattern consistent with stress-induced cardiomyopathy.  Repeat echo on 2/20 showed improvement of EF to 65 to 70%.  Patient was diuresed aggressively with IV Lasix and Diamox over the weekend.  Now on 12.5 mg spironolactone and metoprolol 25 mg twice daily.  Patient  deferred further work-up with cath.  Heart failure to follow-up outpatient and reassess. ?-Continue spironolactone and metoprolol ? ?Moderate malnutrition ?Underweight ?Post-extubation dysphagia ?Tube feeds were initiated on 2/16.  Cortrak tube placed 2/22.  Patient passed swallow evaluation on 2/27 and cortrak removed on 3/2. She is doing well with PO intake now. ?-  Appreciate RD assistance ? ?Dehydration 2/2 poor PO fluid intake - resolved ?Skin turgor normal on exam today. Continue oral PO intake. ?  ?Microcytic anemia ?Stool occult was positive on admission.  Hemoglobin has remained stable.  Recommend outpatient colonoscopy after discharge.  ?  ?Tobacco use disorder ?-Continue nicotine patches ?-Encourage cessation ? ?[place]ral DTI stage I ?-Frequent repositioning.  Heels have cushion dressings applied, no tenderness. ? ?Physical deconditioning ?Patient will need ongoing acute rehabilitation after discharge.  PT OT recommending acute inpatient rehab (3 hours/day). Insurance auth approved, going to [facility]. ? ?Prior to Admission Living Arrangement: Home ?Anticipated Discharge Location: CIR ?Barriers to Discharge: None; patient medically stable to d/c ?Dispo: Anticipated discharge to CIR tomorrow ? ?Virl Axe, MD ?[facility] Resident, PGY-2 ?12/17/2021, 3:28 PM ?Pager: [phone] ?After 5pm on weekdays and 1pm on weekends: On Call pager [phone] ? ?

## 2021-12-18 ENCOUNTER — Inpatient Hospital Stay (HOSPITAL_COMMUNITY)
Admission: RE | Admit: 2021-12-18 | Discharge: 2021-12-21 | DRG: 945 | Disposition: A | Discharge status: Home / Self Care | Payer: Medicare HMO | Source: Intra-hospital | Attending: Physical Medicine and Rehabilitation | Admitting: Physical Medicine and Rehabilitation

## 2021-12-18 DIAGNOSIS — R000 Tachycardia, unspecified: Secondary | ICD-10-CM | POA: Diagnosis present

## 2021-12-18 DIAGNOSIS — E440 Moderate protein-calorie malnutrition: Secondary | ICD-10-CM | POA: Diagnosis not present

## 2021-12-18 DIAGNOSIS — M810 Age-related osteoporosis without current pathological fracture: Secondary | ICD-10-CM | POA: Diagnosis present

## 2021-12-18 DIAGNOSIS — Z681 Body mass index (BMI) 19 or less, adult: Secondary | ICD-10-CM | POA: Diagnosis not present

## 2021-12-18 DIAGNOSIS — F17210 Nicotine dependence, cigarettes, uncomplicated: Secondary | ICD-10-CM | POA: Diagnosis present

## 2021-12-18 DIAGNOSIS — Z7983 Long term (current) use of bisphosphonates: Secondary | ICD-10-CM | POA: Diagnosis not present

## 2021-12-18 DIAGNOSIS — I5021 Acute systolic (congestive) heart failure: Secondary | ICD-10-CM | POA: Diagnosis not present

## 2021-12-18 DIAGNOSIS — R5381 Other malaise: Principal | ICD-10-CM

## 2021-12-18 DIAGNOSIS — J449 Chronic obstructive pulmonary disease, unspecified: Secondary | ICD-10-CM | POA: Diagnosis not present

## 2021-12-18 DIAGNOSIS — J9601 Acute respiratory failure with hypoxia: Secondary | ICD-10-CM | POA: Diagnosis not present

## 2021-12-18 DIAGNOSIS — Z720 Tobacco use: Secondary | ICD-10-CM | POA: Diagnosis present

## 2021-12-18 DIAGNOSIS — L89151 Pressure ulcer of sacral region, stage 1: Secondary | ICD-10-CM | POA: Diagnosis present

## 2021-12-18 DIAGNOSIS — E876 Hypokalemia: Secondary | ICD-10-CM | POA: Diagnosis not present

## 2021-12-18 DIAGNOSIS — J410 Simple chronic bronchitis: Secondary | ICD-10-CM | POA: Diagnosis not present

## 2021-12-18 DIAGNOSIS — Z9181 History of falling: Secondary | ICD-10-CM

## 2021-12-18 DIAGNOSIS — R7989 Other specified abnormal findings of blood chemistry: Secondary | ICD-10-CM | POA: Diagnosis present

## 2021-12-18 DIAGNOSIS — E162 Hypoglycemia, unspecified: Secondary | ICD-10-CM | POA: Diagnosis not present

## 2021-12-18 DIAGNOSIS — R296 Repeated falls: Secondary | ICD-10-CM | POA: Diagnosis present

## 2021-12-18 DIAGNOSIS — R69 Illness, unspecified: Secondary | ICD-10-CM | POA: Diagnosis not present

## 2021-12-18 DIAGNOSIS — J189 Pneumonia, unspecified organism: Secondary | ICD-10-CM

## 2021-12-18 DIAGNOSIS — E8809 Other disorders of plasma-protein metabolism, not elsewhere classified: Secondary | ICD-10-CM | POA: Diagnosis not present

## 2021-12-18 DIAGNOSIS — R739 Hyperglycemia, unspecified: Secondary | ICD-10-CM | POA: Diagnosis not present

## 2021-12-18 LAB — GLUCOSE, CAPILLARY
Glucose-Capillary: 85 mg/dL (ref 70–99)
Glucose-Capillary: 85 mg/dL (ref 70–99)
Glucose-Capillary: 77 mg/dL (ref 70–99)
Glucose-Capillary: 101 mg/dL — ABNORMAL HIGH (ref 70–99)
Glucose-Capillary: 147 mg/dL — ABNORMAL HIGH (ref 70–99)

## 2021-12-18 MED ORDER — ASPIRIN 81 MG PO CHEW: 81.0000 mg | CHEWABLE_TABLET | Freq: Every day | ORAL | 0 refills | Status: DC

## 2021-12-18 MED ORDER — FLUTICASONE FUROATE-VILANTEROL 200-25 MCG/ACT IN AEPB
1.0000 | INHALATION_SPRAY | Freq: Every day | RESPIRATORY_TRACT | Status: DC
  Administered 2021-12-19 – 2021-12-21 (×3): 1 via RESPIRATORY_TRACT
  Filled 2021-12-18: qty 28

## 2021-12-18 MED ORDER — IPRATROPIUM-ALBUTEROL 0.5-2.5 (3) MG/3ML IN SOLN: 3.0000 mL | RESPIRATORY_TRACT | Status: DC | PRN

## 2021-12-18 MED ORDER — POLYETHYLENE GLYCOL 3350 17 G PO PACK: 17.0000 g | PACK | Freq: Every day | ORAL | Status: DC | PRN

## 2021-12-18 MED ORDER — TRAZODONE HCL 50 MG PO TABS
25.0000 mg | ORAL_TABLET | Freq: Every evening | ORAL | Status: DC | PRN
  Filled 2021-12-18: qty 1

## 2021-12-18 MED ORDER — ORAL CARE MOUTH RINSE
15.0000 mL | Freq: Two times a day (BID) | OROMUCOSAL | Status: DC
  Administered 2021-12-18 – 2021-12-20 (×4): 15 mL via OROMUCOSAL

## 2021-12-18 MED ORDER — GUAIFENESIN-DM 100-10 MG/5ML PO SYRP: 5.0000 mL | ORAL_SOLUTION | Freq: Four times a day (QID) | ORAL | Status: DC | PRN

## 2021-12-18 MED ORDER — INSULIN ASPART 100 UNIT/ML IJ SOLN
0.0000 [IU] | Freq: Three times a day (TID) | INTRAMUSCULAR | Status: DC
  Administered 2021-12-18 – 2021-12-19 (×2): 1 [IU] via SUBCUTANEOUS

## 2021-12-18 MED ORDER — ALUM & MAG HYDROXIDE-SIMETH 200-200-20 MG/5ML PO SUSP: 30.0000 mL | ORAL | Status: DC | PRN

## 2021-12-18 MED ORDER — SPIRONOLACTONE 25 MG PO TABS: 12.5000 mg | ORAL_TABLET | Freq: Every day | ORAL | 0 refills | Status: DC

## 2021-12-18 MED ORDER — NICOTINE 21 MG/24HR TD PT24: 21.0000 mg | MEDICATED_PATCH | Freq: Every day | TRANSDERMAL | 0 refills | Status: DC

## 2021-12-18 MED ORDER — NICOTINE 21 MG/24HR TD PT24
21.0000 mg | MEDICATED_PATCH | Freq: Every day | TRANSDERMAL | Status: DC
Start: 2021-12-19 — End: 2021-12-21
  Administered 2021-12-19 – 2021-12-21 (×3): 21 mg via TRANSDERMAL
  Filled 2021-12-18 (×3): qty 1

## 2021-12-18 MED ORDER — OXYCODONE-ACETAMINOPHEN 5-325 MG PO TABS
1.0000 | ORAL_TABLET | Freq: Three times a day (TID) | ORAL | Status: DC | PRN
  Filled 2021-12-18: qty 1

## 2021-12-18 MED ORDER — ACETAMINOPHEN 325 MG PO TABS: 325.0000 mg | ORAL_TABLET | ORAL | Status: DC | PRN

## 2021-12-18 MED ORDER — BISACODYL 10 MG RE SUPP: 10.0000 mg | Freq: Every day | RECTAL | Status: DC | PRN

## 2021-12-18 MED ORDER — ASPIRIN 81 MG PO CHEW
81.0000 mg | CHEWABLE_TABLET | Freq: Every day | ORAL | Status: DC
  Administered 2021-12-19 – 2021-12-21 (×3): 81 mg via ORAL
  Filled 2021-12-18 (×3): qty 1

## 2021-12-18 MED ORDER — WHITE PETROLATUM EX OINT: TOPICAL_OINTMENT | CUTANEOUS | Status: DC | PRN

## 2021-12-18 MED ORDER — FLEET ENEMA 7-19 GM/118ML RE ENEM
1.0000 | ENEMA | Freq: Once | RECTAL | Status: DC | PRN
Start: 2021-12-18 — End: 2021-12-21

## 2021-12-18 MED ORDER — DIPHENHYDRAMINE HCL 12.5 MG/5ML PO ELIX: 12.5000 mg | ORAL_SOLUTION | Freq: Four times a day (QID) | ORAL | Status: DC | PRN

## 2021-12-18 MED ORDER — UMECLIDINIUM BROMIDE 62.5 MCG/ACT IN AEPB
1.0000 | INHALATION_SPRAY | Freq: Every day | RESPIRATORY_TRACT | Status: DC
  Administered 2021-12-19 – 2021-12-21 (×3): 1 via RESPIRATORY_TRACT
  Filled 2021-12-18: qty 7

## 2021-12-18 MED ORDER — SPIRONOLACTONE 12.5 MG HALF TABLET
12.5000 mg | ORAL_TABLET | Freq: Every day | ORAL | Status: DC
  Administered 2021-12-19 – 2021-12-21 (×3): 12.5 mg via ORAL
  Filled 2021-12-18 (×3): qty 1

## 2021-12-18 MED ORDER — ENOXAPARIN SODIUM 40 MG/0.4ML IJ SOSY
40.0000 mg | PREFILLED_SYRINGE | INTRAMUSCULAR | Status: DC
  Administered 2021-12-19 – 2021-12-20 (×2): 40 mg via SUBCUTANEOUS
  Filled 2021-12-18 (×2): qty 0.4

## 2021-12-18 MED ORDER — INSULIN GLARGINE-YFGN 100 UNIT/ML ~~LOC~~ SOLN
5.0000 [IU] | Freq: Every day | SUBCUTANEOUS | Status: DC
  Administered 2021-12-19: 5 [IU] via SUBCUTANEOUS
  Filled 2021-12-18: qty 0.05

## 2021-12-18 MED ORDER — METOPROLOL TARTRATE 25 MG PO TABS
25.0000 mg | ORAL_TABLET | Freq: Two times a day (BID) | ORAL | Status: DC
Start: 2021-12-18 — End: 2021-12-21
  Administered 2021-12-18 – 2021-12-21 (×6): 25 mg via ORAL
  Filled 2021-12-18 (×6): qty 1

## 2021-12-18 MED ORDER — UMECLIDINIUM BROMIDE 62.5 MCG/ACT IN AEPB: 1.0000 | INHALATION_SPRAY | Freq: Every day | RESPIRATORY_TRACT | 1 refills | Status: DC

## 2021-12-18 MED ORDER — FLUTICASONE FUROATE-VILANTEROL 200-25 MCG/ACT IN AEPB: 1.0000 | INHALATION_SPRAY | Freq: Every day | RESPIRATORY_TRACT | 0 refills | Status: DC

## 2021-12-18 NOTE — Progress Notes (Signed)
Inpatient Rehabilitation Admission Medication Review by a Pharmacist ? ?A complete drug regimen review was completed for this patient to identify any potential clinically significant medication issues. ? ?High Risk Drug Classes Is patient taking? Indication by Medication  ?Antipsychotic No   ?Anticoagulant Yes Enoxaparin - VTE prophylaxis  ?Antibiotic No   ?Opioid Yes Oxycodone-APAP prn - Pain  ?Antiplatelet Yes Aspirin - ASCVD  ?Hypoglycemics/insulin Yes Semglee, SSI - Diabetes  ?Vasoactive Medication Yes Metoprolol tartrate, Spironolactone - Heart failure  ?Chemotherapy No   ?Other Yes Breo ellipta, Incruse ellipta - COPD ?Trazodone prn - Sleep  ? ? ? ?Type of Medication Issue Identified Description of Issue Recommendation(s)  ?Drug Interaction(s) (clinically significant) ?    ?Duplicate Therapy ?    ?Allergy ?    ?No Medication Administration End Date ?    ?Incorrect Dose ?    ?Additional Drug Therapy Needed ? Alendronate 70 mg PO every Saturday - noted on discharge summary from acute admission, but has not been resumed in CIR.  Resume as appropriate.  ?Significant med changes from prior encounter (inform family/care partners about these prior to discharge).    ?Other ?    ? ? ?Clinically significant medication issues were identified that warrant physician communication and completion of prescribed/recommended actions by midnight of the next day:  No ? ?Time spent performing this drug regimen review (minutes):  20 ? ? ?Vance Peper, PharmD ?PGY1 Pharmacy Resident ?Phone [phone] ?12/18/2021 1:21 PM  ? ?Please check AMION for all [place] phone numbers ?After 10:00 PM, call [place] [phone] ? ?

## 2021-12-18 NOTE — Discharge Summary (Signed)
Name: Gina Cherry MRN: [id number] DOB: [birth year] 66 y.o. PCP: Annamaria Helling, MD  Date of Admission: 11/28/2021  8:39 AM Date of Discharge: 12/18/2021 Attending Physician: Miguel Aschoff, MD  Discharge Diagnosis: 1. Acute hypoxic respiratory failure 2/2 legionella PNA in setting of COPD 2. Acute systolic heart failure 2/2 stress cardiomyopathy 3. Moderate malnutrition 4. Abnormal liver enzymes 5. Tobacco use disorder 6. Physical deconditioning  Discharge Medications: Allergies as of 12/18/2021       Reactions   Codeine Other (See Comments)   Unknown reaction (possibly sick stomach)        Medication List     STOP taking these medications    ibuprofen 200 MG tablet Commonly known as: ADVIL       TAKE these medications    acetaminophen 500 MG tablet Commonly known as: TYLENOL Take 500 mg by mouth every 6 (six) hours as needed for headache (pain).   alendronate 70 MG tablet Commonly known as: FOSAMAX Take 70 mg by mouth every Saturday.   aspirin 81 MG chewable tablet Chew 1 tablet (81 mg total) by mouth daily. Start taking on: December 19, 2021   fluticasone furoate-vilanterol 200-25 MCG/ACT Aepb Commonly known as: BREO ELLIPTA Inhale 1 puff into the lungs daily. Start taking on: December 19, 2021   Hydrocortisone Ace-Pramoxine 2.5-1 % Crea Apply 1 application topically 2 (two) times daily as needed (hemorrhoids).   multivitamin with minerals Tabs tablet Take 1 tablet by mouth at bedtime.   nicotine 21 mg/24hr patch Commonly known as: NICODERM CQ - dosed in mg/24 hours Place 1 patch (21 mg total) onto the skin daily. Start taking on: December 19, 2021   spironolactone 25 MG tablet Commonly known as: ALDACTONE Take 0.5 tablets (12.5 mg total) by mouth daily. Start taking on: December 19, 2021   umeclidinium bromide 62.5 MCG/ACT Aepb Commonly known as: INCRUSE ELLIPTA Inhale 1 puff into the lungs daily. Start taking on: December 19, 2021   Vitamin D3 50  MCG (2000 UT) Tabs Take 2,000 Units by mouth at bedtime.               Durable Medical Equipment  (From admission, onward)           Start     Ordered   12/16/21 1314  For home use only DME Bedside commode  Once       Question:  Patient needs a bedside commode to treat with the following condition  Answer:  Weakness acquired in ICU   12/16/21 1315   12/16/21 1314  For home use only DME oxygen  Once       Comments: With ambulation  Question Answer Comment  Length of Need 6 Months   Mode or (Route) Nasal cannula   Liters per Minute 2   Oxygen delivery system Gas      12/16/21 1315   12/16/21 1313  For home use only DME Walker rolling  Once       Question Answer Comment  Walker: With 5 Inch Wheels   Patient needs a walker to treat with the following condition Weakness acquired in ICU      12/16/21 1315            Disposition and follow-up:   Gina Cherry was discharged from [facility] in Stable condition.  At the hospital follow up visit please address:  1. Acute hypoxic respiratory failure 2/2 legionella PNA in setting of COPD -Required ICU transfer, intubation,  and pressor support. Successfully extubated on 12/10/2021. Doing better now, still needing 2L [place] with ambulation. Will start breo ellipta and incruse ellipta at discharge. Also providing home oxygen for use with ambulation.  2. Acute systolic heart failure 2/2 stress cardiomyopathy -Initial ECHO 2/14 showing EF 30-45%, but repeat ECHO 2/20 showing EF 65-70%. Deferred workup with cath but will f/u with [facility] as outpatient. Will discharge with spironolactone.   3. Moderate malnutrition -Required tube feeds and cortrak placement. Tolerating diet now and eating well.  4. Abnormal liver enzymes - Thought to be 2/2 shock versus EtOH/steatosis. Recommend repeating liver tests and outpatient workup by PCP if fails to resolve.  5. Tobacco use disorder -discussed  importance of smoking cessation. Prescribed nicotine patch.  6. Physical deconditioning -Given long hospital and ICU stay. Will go to CIR at discharge for ongoing rehabilitation. Provided bedside commode and rolling walker at discharge.  2.  Labs / imaging needed at time of follow-up: CBC, CMP  3.  Pending labs/ test needing follow-up: none  Follow-up Appointments:  Follow-up Information     Bensimhon, Shaune Pascal, MD. Schedule an appointment as soon as possible for a visit in 2 week(s).   Specialty: Cardiology Contact information: [address] [place] [address] [phone]                 Hospital Course:  Gina Cherry is a 66 yo female with history of COPD and tobacco use disorder, admitted on 11/28/2021 with reports of multiple falls, SHOB with cough, weakness, diarrhea. Found to have bilateral CAP with leukocytosis and elevated liver enzymes. Despite initiation of broad-spectrum antibiotics, she developed ARDS 2/2 septic shock and required transfer to ICU and intubated for about 12 days, also requiring pressor support and proning due to worsening ARDS during this time. She was ultimately found to have legionella pneumonia and was treated with levaquin. During ICU stay, she developed acute systolic heart failure likely from stress cardiomyopathy in the setting of ARDS/septic shock. Initial ECHO showed EF 35-40%, cardiology was consulted to help with volume status. Repeat ECHO 6 days later showed improvement of EF to 65-70%. She also had elevated troponins thought to be 2/2 demand ischemia and she was started on ASA for this, which will be continued at discharge. She developed sinus tachycardia in the setting of septic shock, which was managed with metoprolol. Fortunately, she was successfully extubated and able to be weaned off pressors. IMTS resumed care for Gina Cherry once she was stable for ICU discharge. She required cortrak placement to manage nutritional  status, which was successfully removed and she has since been tolerating a regular diet. In terms of respiratory status, she is now able to saturate well on RA at rest, but still requiring 2L [place] with ambulation. PT/OT evaluated and worked with patient, determining that she would benefit from CIR vs HH PT. Discussed with patient and husband and they elected CIR for ongoing acute rehabilitation for functional decline given long hospital/ICU stay. She is medically stable for DC and will go to CIR today. New discharge medications include incruse ellipta, breo ellipta, spironolactone, ASA, and nicotine patch. Home DME orders for bedside commode, rolling walker, and home oxygen have been placed as well. She will need outpatient cardiology follow up with [place] in 2 weeks and outpatient follow up with her PCP in 1-2 weeks, both of which were discussed with patient.   Discharge Exam:   BP 125/75 (BP Location: Right Arm)  Pulse 92    Temp 98.2 F (36.8 C) (Oral)    Resp 18    Ht 5\' 4"  (1.626 m)    Wt 44.1 kg    SpO2 91%    BMI 16.69 kg/m  Discharge exam:  General: thin, frail-appearing elderly female, sitting up in bed, NAD. HENT: healing perioral blisters CV: normal rate and regular rhythm, no m/r/g. Pulm: CTABL, breathing comfortably on RA at rest. No adventitious sounds noted. Abdomen: soft, nondistended, nontender, normoactive bowel sounds. MSK: no peripheral edema noted. Skin: warm and dry. Neuro: AAOx4  Pertinent Labs, Studies, and Procedures:  CBC Latest Ref Rng & Units 12/14/2021 12/13/2021 12/12/2021  WBC 4.0 - 10.5 K/uL 7.3 7.9 11.3(H)  Hemoglobin 12.0 - 15.0 g/dL 9.2(L) 9.1(L) 9.5(L)  Hematocrit 36.0 - 46.0 % 27.3(L) 27.3(L) 28.5(L)  Platelets 150 - 400 K/uL 451(H) 367 429(H)   CMP Latest Ref Rng & Units 12/14/2021 12/13/2021 12/12/2021  Glucose 70 - 99 mg/dL 144(H) 136(H) 146(H)  BUN 8 - 23 mg/dL 16 19 17   Creatinine 0.44 - 1.00 mg/dL 0.41(L) 0.34(L) 0.43(L)  Sodium 135 - 145 mmol/L 139 137  137  Potassium 3.5 - 5.1 mmol/L 3.9 3.4(L) 3.4(L)  Chloride 98 - 111 mmol/L 101 101 98  CO2 22 - 32 mmol/L 31 27 30   Calcium 8.9 - 10.3 mg/dL 9.1 8.9 8.7(L)  Total Protein 6.5 - 8.1 g/dL 5.8(L) 5.8(L) -  Total Bilirubin 0.3 - 1.2 mg/dL 0.3 0.7 -  Alkaline Phos 38 - 126 U/L 112 90 -  AST 15 - 41 U/L 161(H) 116(H) -  ALT 0 - 44 U/L 147(H) 81(H) -   BNP    Component Value Date/Time   BNP 694.9 (H) 12/10/2021 0421   Urinalysis    Component Value Date/Time   COLORURINE YELLOW 11/28/2021 0913   APPEARANCEUR HAZY (A) 11/28/2021 0913   LABSPEC 1.023 11/28/2021 0913   PHURINE 6.0 11/28/2021 0913   GLUCOSEU NEGATIVE 11/28/2021 0913   HGBUR MODERATE (A) 11/28/2021 0913   BILIRUBINUR NEGATIVE 11/28/2021 0913   KETONESUR 20 (A) 11/28/2021 0913   PROTEINUR 100 (A) 11/28/2021 0913   NITRITE NEGATIVE 11/28/2021 0913   LEUKOCYTESUR NEGATIVE 11/28/2021 0913    Blood Culture    Component Value Date/Time   SDES BRONCHIAL ALVEOLAR LAVAGE 11/30/2021 1318   SPECREQUEST RML 11/30/2021 1318   CULT RARE CANDIDA ALBICANS 11/30/2021 1318   REPTSTATUS 12/03/2021 FINAL 11/30/2021 1318   DG Abd 1 View  Result Date: 12/01/2021 CLINICAL DATA:  Cough, weakness EXAM: ABDOMEN - 1 VIEW COMPARISON:  11/30/2021 FINDINGS: Esophagogastric tube is positioned with tip and side port below the diaphragm, position retracted compared to prior examination. Nonobstructive pattern of included bowel gas. Layering right pleural effusion. IMPRESSION: Esophagogastric tube is positioned with tip and side port below the diaphragm, position retracted compared to prior examination. Electronically Signed   By: Delanna Ahmadi M.D.   On: 12/01/2021 11:13   DG Abd 1 View  Result Date: 11/30/2021 CLINICAL DATA:  Nasogastric tube placement. EXAM: ABDOMEN - 1 VIEW COMPARISON:  Chest radiographs same date and 11/28/2021. FINDINGS: 1355 hours. Enteric tube projects below the diaphragm with the tip projecting over the left iliac crest.  This atypical course could be due to dilatation of the stomach. The intrathoracic course of the tube appears normal. The bowel gas pattern appears nonobstructive. Right pleural effusion, bibasilar pulmonary opacities and lumbar spondylosis are noted. IMPRESSION: Enteric tube projects below the diaphragm, although the intra-abdominal portion has an atypical course,  extending into the peripheral left mid abdomen. This could relate to a distended stomach. Consider repeat examination after instilling a small amount of enteric contrast through the tube to better assess its position. Electronically Signed   By: Richardean Sale M.D.   On: 11/30/2021 14:13   CT Head Wo Contrast  Result Date: 11/28/2021 CLINICAL DATA:  Memory loss. Fall with laceration to the back of the head. EXAM: CT HEAD WITHOUT CONTRAST TECHNIQUE: Contiguous axial images were obtained from the base of the skull through the vertex without intravenous contrast. RADIATION DOSE REDUCTION: This exam was performed according to the departmental dose-optimization program which includes automated exposure control, adjustment of the mA and/or kV according to patient size and/or use of iterative reconstruction technique. COMPARISON:  None. FINDINGS: Brain: The brainstem, cerebellum, cerebral peduncles, thalami, basal ganglia, basilar cisterns, and ventricular system appear within normal limits. No intracranial hemorrhage, mass lesion, or acute CVA. Vascular: There is atherosclerotic calcification of the cavernous carotid arteries bilaterally. Skull: Incidental arachnoid granulation in the right occipital bone. No acute calvarial findings. Sinuses/Orbits: Unremarkable Other: No supplemental non-categorized findings. IMPRESSION: 1. No acute intracranial findings. 2. Atherosclerosis. Electronically Signed   By: Van Clines M.D.   On: 11/28/2021 10:27   DG CHEST PORT 1 VIEW  Result Date: 12/09/2021 CLINICAL DATA:  66 year old female with pulmonary edema.  EXAM: PORTABLE CHEST - 1 VIEW COMPARISON:  12/06/2021, 12/03/2021, 12/01/2021, 11/30/2021 FINDINGS: The mediastinal contours are unchanged. Similar appearing atherosclerotic calcification of the aortic arch. Unchanged position of indwelling endotracheal tube with the tip in the midthoracic trachea. Enteric feeding tube terminates off the inferior aspect of this image. Unchanged blunting of the right costophrenic angle. Hazy bibasilar pulmonary opacities, right greater than left. Mild cephalization of pulmonary vasculature. No acute osseous abnormality. IMPRESSION: 1. Similar position of indwelling endotracheal tube with the tip in the midthoracic trachea. 2. Similar appearing mild pulmonary edema, small right pleural effusion, and bibasilar subsegmental atelectasis. Electronically Signed   By: Ruthann Cancer M.D.   On: 12/09/2021 10:13   DG CHEST PORT 1 VIEW  Result Date: 12/06/2021 CLINICAL DATA:  Respiratory failure with hypoxia. EXAM: PORTABLE CHEST 1 VIEW COMPARISON:  12/03/2021; 12/01/2021 FINDINGS: Grossly unchanged cardiac silhouette and mediastinal contours with atherosclerotic plaque within thoracic aorta. Interval right jugular approach central venous catheter. Otherwise, stable positioning remaining support apparatus. No pneumothorax. The pulmonary vasculature remains indistinct with cephalization flow. Unchanged small to moderate-sized right-sided pleural effusion with associated bibasilar heterogeneous/consolidative opacities, right greater than left. No new focal airspace opacities. No acute osseous abnormalities. IMPRESSION: 1. Interval removal of right jugular approach central venous catheter. Otherwise, stable positioning of remaining support apparatus. No pneumothorax. 2. Otherwise similar findings of pulmonary edema, small to moderate-sized right-sided effusion and associated bibasilar heterogeneous/consolidative opacities, right greater than left, atelectasis versus infiltrate. Electronically  Signed   By: Sandi Mariscal M.D.   On: 12/06/2021 08:11   DG Chest Port 1 View  Result Date: 12/03/2021 CLINICAL DATA:  Diarrhea, cough, weakness. EXAM: PORTABLE CHEST 1 VIEW COMPARISON:  February 16 23. FINDINGS: Increased retrocardiac opacity with otherwise similar patchy interstitial airspace opacities in the right greater than left lungs. Probable small left and moderate right layering pleural effusions. No visible pneumothorax. Endotracheal tube tip is approximately 4.5 cm above the carina. Right IJ central venous catheter with the tip projecting in the lower SVC. Gastric tube courses below the diaphragm in outside the field of view. IMPRESSION: 1. Increased retrocardiac opacity with otherwise similar patchy interstitial airspace opacities  in the right greater than left lungs. Findings could relate to asymmetric edema and/or multifocal pneumonia. 2. Probable small left and moderate right layering pleural effusions. Electronically Signed   By: Margaretha Sheffield M.D.   On: 12/03/2021 09:55   DG Chest Port 1 View  Result Date: 12/01/2021 CLINICAL DATA:  Pneumonia. EXAM: PORTABLE CHEST 1 VIEW COMPARISON:  11/30/2021. FINDINGS: The heart size and mediastinal contours are stable. Atherosclerotic calcification of the aorta is noted patchy airspace disease is noted in the lungs bilaterally, greater on the right than on the left. There is a small left pleural effusion and moderate right pleural effusion. No pneumothorax. The distal tip of the endotracheal tube terminates 5.1 cm above the carina. An enteric tube courses over the stomach and out of the field of view. A right internal jugular central venous catheter is stable. IMPRESSION: 1. Patchy airspace disease in the lungs bilaterally, greater on the right than on the left, possible edema or infiltrate. 2. Small pleural effusion on the left and moderate pleural effusion on the right. 3. Medical devices as described above. Electronically Signed   By: Brett Fairy  M.D.   On: 12/01/2021 04:52   Portable Chest x-ray  Result Date: 11/30/2021 CLINICAL DATA:  Endotracheal tube placement. EXAM: PORTABLE CHEST 1 VIEW COMPARISON:  Radiographs 11/30/2021 and 11/28/2021. FINDINGS: 1349 hours. Interval intubation. Tip of the endotracheal tube overlies the mid trachea. Enteric tube projects below the diaphragm, tip not visualized. Right IJ central venous catheter projects to the level of the mid SVC. Subpulmonic right pleural effusion has enlarged over the last 2 days. There are associated right greater than left perihilar and lower lobe airspace opacities which are unchanged from earlier today. No evidence of pneumothorax. The heart size and mediastinal contours are stable. IMPRESSION: Satisfactory position of the endotracheal and enteric tubes. No change in right pleural effusion and right greater than left airspace opacities. Electronically Signed   By: Richardean Sale M.D.   On: 11/30/2021 14:09   DG Chest Port 1 View  Result Date: 11/30/2021 CLINICAL DATA:  [place].  Placement. EXAM: PORTABLE CHEST 1 VIEW COMPARISON:  November 30, 2021 at 523 hours. FINDINGS: Right central venous catheter with tip overlying the superior cavoatrial junction. No visible pneumothorax. Similar confluent opacification of the right mid/lower lung with air bronchograms. Probable small right pleural effusion appears similar. Similar reticulonodular opacity in the left mid lung suspicious for early infectious involvement. The heart size and mediastinal contours is partially obscured but appears unchanged. The visualized skeletal structures are unchanged. IMPRESSION: 1. Right central venous catheter with tip overlying the superior cavoatrial junction, with indication of A-line placement. If this was attempted arterial line insertion recommend correlation with ABGs. No visible pneumothorax. 2. Similar confluent opacification of the right mid/lower lung with air bronchograms and probable small right  pleural effusion. 3. No significant interval change in the reticulonodular left mid lung opacities. Electronically Signed   By: Dahlia Bailiff M.D.   On: 11/30/2021 09:53   DG Chest Port 1 View  Result Date: 11/30/2021 CLINICAL DATA:  66 year old female with sepsis.  Pneumonia. EXAM: PORTABLE CHEST 1 VIEW COMPARISON:  Portable chest 11/28/2021 and earlier. FINDINGS: Portable AP semi upright view at 0523 hours. Progressed and now confluent opacification throughout the right mid and lower lung. Increased lower lobe air bronchograms near the hilum. Possible superimposed component of pleural fluid. Stable lung volumes. Left perihilar and peripheral lung reticular opacity is stable and might be early infection. No pneumothorax.  No left lung pleural effusion. Stable cardiac size and mediastinal contours. Calcified aortic atherosclerosis. Visualized tracheal air column is within normal limits. No acute osseous abnormality identified. Negative visible bowel gas. IMPRESSION: 1. Worsening opacification of the right mid and lower lung compatible with progressed pneumonia and possible associated right pleural effusion now. 2. Reticulonodular opacity in the left mid lung suspicious for early infectious involvement. Electronically Signed   By: Genevie Ann M.D.   On: 11/30/2021 06:54   DG CHEST PORT 1 VIEW  Result Date: 11/28/2021 CLINICAL DATA:  Pneumonia. EXAM: PORTABLE CHEST 1 VIEW COMPARISON:  Chest radiograph dated 11/28/2021. FINDINGS: Bilateral pulmonary opacities, right greater than left appear similar or slightly worsened compared to prior radiograph. There is background of emphysema. No pneumothorax. Stable cardiomediastinal silhouette. Atherosclerotic calcification of the aorta. No acute osseous pathology. IMPRESSION: Bilateral pulmonary opacities, right greater than left, similar or slightly worsened compared to prior radiograph. No Electronically Signed   By: Anner Crete M.D.   On: 11/28/2021 22:14   DG  CHEST PORT 1 VIEW  Result Date: 11/28/2021 CLINICAL DATA:  Dyspnea EXAM: PORTABLE CHEST 1 VIEW COMPARISON:  11/28/2021 FINDINGS: Worsening interstitial and alveolar disease in the right thorax. Mild disease at the left base. No pleural effusion. Borderline cardiomegaly with aortic atherosclerosis. IMPRESSION: Worsening right greater than left pulmonary airspace disease suspicious for pneumonia. Electronically Signed   By: Donavan Foil M.D.   On: 11/28/2021 21:35   DG Chest Portable 1 View  Result Date: 11/28/2021 CLINICAL DATA:  Cough and shortness of breath. EXAM: PORTABLE CHEST 1 VIEW COMPARISON:  0928 hours FINDINGS: There is focal airspace disease in the right lung base compatible with pneumonia. Left lung clear. The cardio pericardial silhouette is enlarged. The visualized bony structures of the thorax show no acute abnormality. Telemetry leads overlie the chest. IMPRESSION: Focal airspace opacity at the right lung base compatible with pneumonia. Follow-up imaging recommended to ensure resolution. Electronically Signed   By: Misty Stanley M.D.   On: 11/28/2021 09:45   DG Abd Portable 1V  Result Date: 12/07/2021 CLINICAL DATA:  Feeding tube placement EXAM: PORTABLE ABDOMEN - 1 VIEW COMPARISON:  12/01/2021 FINDINGS: Feeding tube has been placed in the interval. This terminates in the body of the stomach. Nasogastric tube has been removed. Endotracheal tube terminates 3.5 cm above carina. Right greater than left base airspace disease again identified. Cardiomegaly. No gross free intraperitoneal air. IMPRESSION: Feeding tube terminating at the gastric body. Electronically Signed   By: Abigail Miyamoto M.D.   On: 12/07/2021 13:32   ECHOCARDIOGRAM COMPLETE  Result Date: 11/29/2021    ECHOCARDIOGRAM REPORT   Patient Name:   Gina Cherry [facility] Date of Exam: 11/29/2021 Medical Rec #:  [id number]           Height:       64.0 in Accession #:    [id number]          Weight:       100.0 lb Date of Birth:  Mar 06, 1956            BSA:          1.457 m Patient Age:    35 years            BP:           143/82 mmHg Patient Gender: F                   HR:  112 bpm. Exam Location:  Inpatient Procedure: 2D Echo, Cardiac Doppler and Color Doppler Indications:    R50.9 FEVER  History:        Patient has no prior history of Echocardiogram examinations. NO                 HISTORY.  Sonographer:    Beryle Beams Referring Phys: [id number] Elliott  1. There is moderately reduced LV function with regional wall motion abnormalities, 30-35%. There is hypokinesis of the basal to mid segments of all walls of the LV. There is apical hyperkinesis. This pattern could represent an atypical stress induced cardiomyopathy pattern as this spares the apex (non-coronary distributions). Left ventricular ejection fraction, by estimation, is 30 to 35%. The left ventricle has moderately decreased function. The left ventricle demonstrates regional wall motion abnormalities (see scoring diagram/findings for description). Indeterminate diastolic filling due to E-A fusion.  2. Right ventricular systolic function is normal. The right ventricular size is normal. Tricuspid regurgitation signal is inadequate for assessing PA pressure.  3. A small pericardial effusion is present. The pericardial effusion is circumferential. There is no evidence of cardiac tamponade.  4. The mitral valve is grossly normal. Mild mitral valve regurgitation. No evidence of mitral stenosis.  5. Nodular calcium noted attached to the [place]. No regurgitation. Does not appear to represent vegetation. The aortic valve is tricuspid. There is mild calcification of the aortic valve. Aortic valve regurgitation is not visualized. No aortic stenosis is present.  6. The inferior vena cava is normal in size with greater than 50% respiratory variability, suggesting right atrial pressure of 3 mmHg. FINDINGS  Left Ventricle: There is moderately reduced LV function with regional wall  motion abnormalities, 30-35%. There is hypokinesis of the basal to mid segments of all walls of the LV. There is apical hyperkinesis. This pattern could represent an atypical stress induced cardiomyopathy pattern as this spares the apex (non-coronary distributions). Left ventricular ejection fraction, by estimation, is 30 to 35%. The left ventricle has moderately decreased function. The left ventricle demonstrates regional wall motion abnormalities. The left ventricular internal cavity size was normal in size. There is no left ventricular hypertrophy. Indeterminate diastolic filling due to E-A fusion. Right Ventricle: The right ventricular size is normal. No increase in right ventricular wall thickness. Right ventricular systolic function is normal. Tricuspid regurgitation signal is inadequate for assessing PA pressure. Left Atrium: Left atrial size was normal in size. Right Atrium: Right atrial size was normal in size. Pericardium: A small pericardial effusion is present. The pericardial effusion is circumferential. There is no evidence of cardiac tamponade. Mitral Valve: The mitral valve is grossly normal. Mild mitral valve regurgitation. No evidence of mitral valve stenosis. Tricuspid Valve: The tricuspid valve is grossly normal. Tricuspid valve regurgitation is not demonstrated. No evidence of tricuspid stenosis. Aortic Valve: Nodular calcium noted attached to the [place]. No regurgitation. Does not appear to represent vegetation. The aortic valve is tricuspid. There is mild calcification of the aortic valve. Aortic valve regurgitation is not visualized. No aortic stenosis is present. Aortic valve mean gradient measures 4.0 mmHg. Aortic valve peak gradient measures 6.6 mmHg. Aortic valve area, by VTI measures 1.77 cm. Pulmonic Valve: The pulmonic valve was grossly normal. Pulmonic valve regurgitation is not visualized. No evidence of pulmonic stenosis. Aorta: The aortic root and ascending aorta are structurally  normal, with no evidence of dilitation. Venous: The inferior vena cava is normal in size with greater than 50% respiratory variability, suggesting right  atrial pressure of 3 mmHg. IAS/Shunts: The atrial septum is grossly normal.  LEFT VENTRICLE PLAX 2D LVIDd:         3.60 cm     Diastology LVIDs:         2.20 cm     LV e' medial:    6.53 cm/s LV PW:         0.90 cm     LV E/e' medial:  8.5 LV IVS:        0.70 cm     LV e' lateral:   10.90 cm/s LVOT diam:     1.70 cm     LV E/e' lateral: 5.1 LV SV:         33 LV SV Index:   23 LVOT Area:     2.27 cm  LV Volumes (MOD) LV vol d, MOD A2C: 35.1 ml LV vol d, MOD A4C: 47.9 ml LV vol s, MOD A2C: 15.4 ml LV vol s, MOD A4C: 21.3 ml LV SV MOD A2C:     19.7 ml LV SV MOD A4C:     47.9 ml LV SV MOD BP:      24.0 ml RIGHT VENTRICLE             IVC RV S prime:     50.00 cm/s  IVC diam: 1.90 cm TAPSE (M-mode): 1.5 cm LEFT ATRIUM             Index        RIGHT ATRIUM           Index LA diam:        2.20 cm 1.51 cm/m   RA Area:     12.80 cm LA Vol (A2C):   22.9 ml 15.72 ml/m  RA Volume:   29.30 ml  20.11 ml/m LA Vol (A4C):   13.4 ml 9.20 ml/m LA Biplane Vol: 18.5 ml 12.70 ml/m  AORTIC VALVE                    PULMONIC VALVE AV Area (Vmax):    1.88 cm     PV Vmax:       0.73 m/s AV Area (Vmean):   1.72 cm     PV Vmean:      51.000 cm/s AV Area (VTI):     1.77 cm     PV VTI:        0.118 m AV Vmax:           128.00 cm/s  PV Peak grad:  2.2 mmHg AV Vmean:          89.800 cm/s  PV Mean grad:  1.0 mmHg AV VTI:            0.189 m AV Peak Grad:      6.6 mmHg AV Mean Grad:      4.0 mmHg LVOT Vmax:         106.00 cm/s LVOT Vmean:        68.100 cm/s LVOT VTI:          0.147 m LVOT/AV VTI ratio: 0.78  AORTA Ao Root diam: 2.60 cm Ao Asc diam:  2.30 cm MITRAL VALVE MV Area (PHT): 4.10 cm    SHUNTS MV Decel Time: 185 msec    Systemic VTI:  0.15 m MV E velocity: 55.80 cm/s  Systemic Diam: 1.70 cm MV A velocity: 58.60 cm/s MV E/A ratio:  0.95 Eleonore Chiquito MD Electronically signed by  [place]ells  O'Neal MD Signature Date/Time: 11/29/2021/12:00:15 PM    Final    ECHOCARDIOGRAM LIMITED  Result Date: 12/05/2021    ECHOCARDIOGRAM LIMITED REPORT   Patient Name:   Gina Cherry [facility] Date of Exam: 12/05/2021 Medical Rec #:  [id number]           Height:       64.0 in Accession #:    [id number]          Weight:       123.5 lb Date of Birth:  [birth year]           BSA:          1.594 m Patient Age:    79 years            BP:           124/64 mmHg Patient Gender: F                   HR:           109 bpm. Exam Location:  Inpatient Procedure: Limited Echo, Color Doppler and 3D Echo Indications:    CHF-Acute Systolic [id number]  History:        Patient has prior history of Echocardiogram examinations, most                 recent 11/29/2021. Arrythmias:Sinus tachycardia. ETOH. Acute                 Hypoxic Respiratory Failure.  Sonographer:    Darlina Sicilian RDCS Referring Phys: [phone] [facility]  Sonographer Comments: Echo performed with patient supine and on artificial respirator. IMPRESSIONS  1. Left ventricular ejection fraction, by estimation, is 70 to 75%. The left ventricle has hyperdynamic function. The left ventricle has no regional wall motion abnormalities. There is moderate left ventricular hypertrophy.  2. Right ventricular systolic function is moderately reduced. The right ventricular size is mildly enlarged. Moderately increased right ventricular wall thickness. There is mildly elevated pulmonary artery systolic pressure.  3. The mitral valve is normal in structure. No evidence of mitral valve regurgitation.  4. The aortic valve is normal in structure. Aortic valve regurgitation is not visualized. No aortic stenosis is present. FINDINGS  Left Ventricle: Left ventricular ejection fraction, by estimation, is 70 to 75%. The left ventricle has hyperdynamic function. The left ventricle has no regional wall motion abnormalities. The left ventricular internal cavity size was small. There is moderate  left ventricular hypertrophy. Right Ventricle: The right ventricular size is mildly enlarged. Moderately increased right ventricular wall thickness. Right ventricular systolic function is moderately reduced. There is mildly elevated pulmonary artery systolic pressure. The tricuspid regurgitant velocity is 2.70 m/s, and with an assumed right atrial pressure of 8 mmHg, the estimated right ventricular systolic pressure is 0000000 mmHg. Pericardium: Trivial pericardial effusion is present. Mitral Valve: The mitral valve is normal in structure. Tricuspid Valve: Tricuspid valve regurgitation is mild. Aortic Valve: The aortic valve is normal in structure. Aortic valve regurgitation is not visualized. No aortic stenosis is present. TRICUSPID VALVE TR Peak grad:   29.2 mmHg TR Vmax:        270.00 cm/s Mertie Moores MD Electronically signed by Mertie Moores MD Signature Date/Time: 12/05/2021/3:05:34 PM    Final      Discharge Instructions: Discharge Instructions     Diet - low sodium heart healthy   Complete by: As directed    Discharge instructions   Complete by: As directed    Gina Cherry,  it was a pleasure taking care of you during your stay here. I hope that the inpatient rehabilitation helps you get stronger. Please take note of the following:  1. There are a few new medications that you will need to take going forward. Spironolactone and baby aspirin for your heart (one tablet of each daily). Breo Ellipta and Incruse Ellipta are inhalers to help with your breathing, please use as directed. Lastly, I have prescribed nicotine patches because it will be very important for you to stop smoking cigarettes to help your lungs and breathing improve. I have sent these medications to the [facility] on [address] and [address]. 2. You will be going to inpatient rehabilitation from here to get stronger and more comfortable with moving around. 3. I have ordered a portable oxygen tank, bedside commode, and rolling walker to  help ease your transition when you get back home. 4. Please make sure to call the [facility] Cardiology clinic to schedule a visit with a heart doctor in 2 weeks for hospital follow up (the number is listed on your paperwork). 5. Lastly, please make sure to see your primary care doctor in 1-2 weeks to make sure they are up to date about this hospitalization.   Increase activity slowly   Complete by: As directed    No wound care   Complete by: As directed        Signed: Virl Axe, MD 12/18/2021, 9:54 AM   Pager: [phone]

## 2021-12-18 NOTE — Progress Notes (Signed)
Inpatient Rehab Admissions Coordinator:  ° °I have a CIR bed for this Pt. Today. RN may call report to [phone]. ° °Shyanne Mcclary, MS, CCC-SLP °Rehab Admissions Coordinator  °[phone] (celll) °[phone] (office) ° °

## 2021-12-18 NOTE — Progress Notes (Signed)
Mobility Specialist Progress Note: ? ? 12/18/21 1048  ?Mobility  ?Activity Ambulated with assistance in hallway  ?Level of Assistance Independent after set-up  ?Assistive Device Front wheel walker  ?Distance Ambulated (ft) 220 ft  ?Activity Response Tolerated well  ?$Mobility charge 1 Mobility  ? ?Pt received on BSC, required peri-care. No complaints of pain. Pt left in bed with call bell in reach and all needs met.  ? ?Henritta Mutz ?Mobility Specialist ?Primary Phone [phone] ? ?

## 2021-12-18 NOTE — Progress Notes (Signed)
INPATIENT REHABILITATION ADMISSION NOTE ? ? ?Arrival Method: Pt arrived to unit via wheelchair at 1245pm. ? ?   ?Mental Orientation: A/Ox4 ? ? ?Assessment: Completed ? ? ?Skin: See LDA ? ? ?IV'S: Right forearm, L wrist ? ? ?Pain: None ? ? ?Tubes and Drains: None ? ? ?Safety Measures: Discussed with pt and husband. 3 side rails up , call bell within reach.  ? ? ?Vital Signs: Completed  ?  ? ?Height and Weight: Charted ? ? ?Rehab Orientation: Discussed with pt and husband at bedside,  showed understanding, no questions at this time. ? ? ?Family: notified  ? ?Trey Sailors LPN ?

## 2021-12-18 NOTE — H&P (Addendum)
Physical Medicine and Rehabilitation Admission H&P        Chief Complaint  Patient presents with   Functional decline due to multiple medical issues.       HPI:  Gina Cherry is a 66 year old female with history of COPD w/ tobacco use,  osteoporosis who was admitted on 11/28/21 with reports of multiple falls, SOB w/cough, generalized weakness and diarrhea. She was noted to have bilateral CAP with leukocytosis and elevated LFTs with hypokalemia.  She was started on broad-spectrum antibiotics but developed ARDS due to septic shock and was intubated and required proning due to worsening of ARDS with face discoloration.  She was found to have Legionella PNA and antibiotics narrowed to levofloxacin.  She developed acute systolic heart failure from Takotsubo cardiomyopathy with EF 30 to 35% in setting of acute sepsis.    Dr. Gala Romney consulted to manage fluid overload and repeat echo 2/20 showed improvement in EF to 65-70%.  Elevated trops felt to be due to demand ischemia and question of After recovery.  Metoprolol was added to manage sinus tachycardia.  She tolerated extubation and was weaned off pressors.  MBS on 02/27 and she was started on regular diet with nectar liquids was advanced to thin liquids  as swallow function improved.  Respiratory status improving and she has been weaned down to 2 L per [place] with activity.  She continues to be limited by weakness and balance deficits due to recent illness.  CIR was recommended due to functional decline.     Review of Systems  Constitutional:  Negative for chills and fever.  HENT:  Negative for ear pain and hearing loss.   Eyes:  Negative for blurred vision and double vision.  Respiratory:  Positive for cough and shortness of breath.   Cardiovascular:  Negative for chest pain and leg swelling.  Gastrointestinal:  Negative for nausea and vomiting.  Genitourinary:  Negative for dysuria and frequency.  Musculoskeletal:  Negative for  myalgias.  Skin:  Negative for rash.  Neurological:  Positive for weakness. Negative for dizziness and headaches.  Psychiatric/Behavioral:  Negative for depression and substance abuse.           Past Medical History:  Diagnosis Date   COPD (chronic obstructive pulmonary disease) (HCC)     Osteoporosis     Underweight        History reviewed. No pertinent surgical history.     No family history on file.     Social History: Lives with boyfriend. Per  reports that she has been smoking cigarettes. She has been smoking an average of .5 packs per day. She does not have any smokeless tobacco history on file. She reports current alcohol use--2 drinks per day. . She reports that she does not use drugs.          Allergies  Allergen Reactions   Codeine Other (See Comments)      Unknown reaction (possibly sick stomach)            Medications Prior to Admission  Medication Sig Dispense Refill   acetaminophen (TYLENOL) 500 MG tablet Take 500 mg by mouth every 6 (six) hours as needed for headache (pain).       alendronate (FOSAMAX) 70 MG tablet Take 70 mg by mouth every Saturday.       Cholecalciferol (VITAMIN D3) 50 MCG (2000 UT) TABS Take 2,000 Units by mouth at bedtime.       ibuprofen (ADVIL) 200 MG  tablet Take 200 mg by mouth every 6 (six) hours as needed for headache (pain).       Multiple Vitamin (MULTIVITAMIN WITH MINERALS) TABS tablet Take 1 tablet by mouth at bedtime.       Pramoxine-HC (HYDROCORTISONE ACE-PRAMOXINE) 2.5-1 % CREA Apply 1 application topically 2 (two) times daily as needed (hemorrhoids).              Home: Home Living Family/patient expects to be discharged to:: Private residence Living Arrangements: Spouse/significant other Available Help at Discharge: Family, Available 24 hours/day Type of Home: House Home Access: Stairs to enter [employer] of Steps: 3 Entrance Stairs-Rails: Left Home Layout: One level Bathroom Shower/Tub: Tub/shower unit,  Door [employer] Toilet: Standard Home Equipment: None Additional Comments: partner is Consulting civil engineer. They are both retired. Just got back from vacation at [place] History: Prior Function Prior Level of Function : Independent/Modified Independent, Driving   Functional Status:  Mobility: Bed Mobility Overal bed mobility: Needs Assistance Bed Mobility: Supine to Sit Rolling: Mod assist, Max assist, +2 for physical assistance Supine to sit: Supervision, HOB elevated Sit to supine: Supervision General bed mobility comments: up in chair after OT Transfers Overall transfer level: Needs assistance Equipment used: Rolling walker (2 wheels) Transfers: Sit to/from Stand Sit to Stand: Min guard Bed to/from chair/wheelchair/BSC transfer type:: Step pivot Stand pivot transfers: Min assist Step pivot transfers: Min assist  Lateral/Scoot Transfers: Mod assist, +2 physical assistance General transfer comment: up to stand from recliner to [place] for balance, stepping to [facility] with A for lines and to steady with RW Ambulation/Gait Ambulation/Gait assistance: Min assist Gait Distance (Feet): 22 Feet Assistive device: Rolling walker (2 wheels) Gait Pattern/deviations: Step-through pattern, Step-to pattern, Decreased stride length, Wide base of support, Decreased dorsiflexion - right, Decreased dorsiflexion - left, Trunk flexed General Gait Details: able to walk to door and back to chair with RW and stopping x 1 to flex ankles due to stiffness Pre-gait activities: pt marching in place, reduced foot clearance bilaterally, posterior lean   ADL: ADL Overall ADL's : Needs assistance/impaired Eating/Feeding: NPO Grooming: Set up, Sitting Upper Body Bathing: Minimal assistance, Sitting Lower Body Bathing: Moderate assistance, Sitting/lateral leans Upper Body Dressing : Minimal assistance, Sitting Lower Body Dressing: Moderate assistance, Minimal assistance, Sit to/from stand Lower Body Dressing  Details (indicate cue type and reason): Pt can now cross her legs to get to her feet while seated EOB--can doff but can't donn socks as of yet Toilet Transfer: Stand-pivot, Rolling walker (2 wheels), Minimal assistance Toileting- Clothing Manipulation and Hygiene: Maximal assistance Functional mobility during ADLs: Moderate assistance, +2 for safety/equipment General ADL Comments: easily fatigues; complains of dizziness   Cognition: Cognition Overall Cognitive Status: Within Functional Limits for tasks assessed Orientation Level: Oriented X4 Cognition Arousal/Alertness: Awake/alert Behavior During Therapy: WFL for tasks assessed/performed Overall Cognitive Status: Within Functional Limits for tasks assessed Area of Impairment: Safety/judgement, Problem solving Safety/Judgement: Decreased awareness of safety, Decreased awareness of deficits Problem Solving: Slow processing General Comments: following commands and interactive, still reaches to her mouth, but not always trying to pull tubes Difficult to assess due to: Intubated, Impaired communication   Physical Exam: Blood pressure 128/64, pulse 95, temperature 98 F (36.7 C), temperature source Oral, resp. rate 18, height 5\' 4"  (1.626 m), weight 46.1 kg, SpO2 (!) 88 %. Physical Exam Constitutional:      General: She is not in acute distress.    Appearance: She is not ill-appearing.  HENT:  Right Ear: External ear normal.     Left Ear: External ear normal.     Nose: Nose normal.     Mouth/Throat:     Mouth: Mucous membranes are moist.  Eyes:     Pupils: Pupils are equal, round, and reactive to light.  Cardiovascular:     Rate and Rhythm: Normal rate and regular rhythm.     Heart sounds: No murmur heard.   No gallop.  Pulmonary:     Effort: Pulmonary effort is normal. No respiratory distress.     Breath sounds: No wheezing, rhonchi or rales.  Abdominal:     General: Abdomen is flat. There is no distension.     Palpations:  Abdomen is soft.     Tenderness: There is no abdominal tenderness.  Musculoskeletal:        General: No swelling or tenderness.     Cervical back: Normal range of motion.     Right lower leg: No edema.     Left lower leg: No edema.  Skin:    General: Skin is warm and dry.     Comments: Small stg 1 sacrum. Skin tears bilateral lower legs  Neurological:     General: No focal deficit present.     Mental Status: She is oriented to person, place, and time.     Cranial Nerves: No cranial nerve deficit.     Sensory: No sensory deficit.     Comments: Motor 4-5/5 UE, 4/5 LE prox to distal. No abnl tone. Normal coordination.   Psychiatric:        Mood and Affect: Mood normal.        Behavior: Behavior normal.        Thought Content: Thought content normal.        Judgment: Judgment normal.      Lab Results Last 48 Hours        Results for orders placed or performed during the hospital encounter of 11/28/21 (from the past 48 hour(s))  Glucose, capillary     Status: Abnormal    Collection Time: 12/14/21 12:11 PM  Result Value Ref Range    Glucose-Capillary 114 (H) 70 - 99 mg/dL      Comment: Glucose reference range applies only to samples taken after fasting for at least 8 hours.  Glucose, capillary     Status: Abnormal    Collection Time: 12/14/21  3:41 PM  Result Value Ref Range    Glucose-Capillary 191 (H) 70 - 99 mg/dL      Comment: Glucose reference range applies only to samples taken after fasting for at least 8 hours.  Glucose, capillary     Status: Abnormal    Collection Time: 12/14/21  7:53 PM  Result Value Ref Range    Glucose-Capillary 154 (H) 70 - 99 mg/dL      Comment: Glucose reference range applies only to samples taken after fasting for at least 8 hours.  Glucose, capillary     Status: Abnormal    Collection Time: 12/14/21 11:38 PM  Result Value Ref Range    Glucose-Capillary 150 (H) 70 - 99 mg/dL      Comment: Glucose reference range applies only to samples taken  after fasting for at least 8 hours.  Glucose, capillary     Status: Abnormal    Collection Time: 12/15/21  4:17 AM  Result Value Ref Range    Glucose-Capillary 167 (H) 70 - 99 mg/dL      Comment: Glucose reference  range applies only to samples taken after fasting for at least 8 hours.  Glucose, capillary     Status: Abnormal    Collection Time: 12/15/21  7:39 AM  Result Value Ref Range    Glucose-Capillary 122 (H) 70 - 99 mg/dL      Comment: Glucose reference range applies only to samples taken after fasting for at least 8 hours.  Glucose, capillary     Status: Abnormal    Collection Time: 12/15/21 11:18 AM  Result Value Ref Range    Glucose-Capillary 144 (H) 70 - 99 mg/dL      Comment: Glucose reference range applies only to samples taken after fasting for at least 8 hours.  Glucose, capillary     Status: Abnormal    Collection Time: 12/15/21  4:00 PM  Result Value Ref Range    Glucose-Capillary 103 (H) 70 - 99 mg/dL      Comment: Glucose reference range applies only to samples taken after fasting for at least 8 hours.  Glucose, capillary     Status: Abnormal    Collection Time: 12/15/21  8:21 PM  Result Value Ref Range    Glucose-Capillary 113 (H) 70 - 99 mg/dL      Comment: Glucose reference range applies only to samples taken after fasting for at least 8 hours.  Glucose, capillary     Status: None    Collection Time: 12/16/21 12:12 AM  Result Value Ref Range    Glucose-Capillary 85 70 - 99 mg/dL      Comment: Glucose reference range applies only to samples taken after fasting for at least 8 hours.  Glucose, capillary     Status: None    Collection Time: 12/16/21  3:16 AM  Result Value Ref Range    Glucose-Capillary 87 70 - 99 mg/dL      Comment: Glucose reference range applies only to samples taken after fasting for at least 8 hours.  Glucose, capillary     Status: None    Collection Time: 12/16/21  7:59 AM  Result Value Ref Range    Glucose-Capillary 82 70 - 99 mg/dL       Comment: Glucose reference range applies only to samples taken after fasting for at least 8 hours.      Imaging Results (Last 48 hours)  No results found.         Blood pressure 128/64, pulse 95, temperature 98 F (36.7 C), temperature source Oral, resp. rate 18, height 5\' 4"  (1.626 m), weight 46.1 kg, SpO2 (!) 88 %.   Medical Problem List and Plan: 1. Functional deficits secondary to debility after Acute respiratory failure d/t legionella pneumonia             -patient may shower             -ELOS/Goals: 5 days, mod I 2.  Antithrombotics: -DVT/anticoagulation:  Pharmaceutical: Lovenox             -antiplatelet therapy: asa 3. Pain Management: tylenol prn.  4. Mood: LCSW to follow for evaluation and support.              -antipsychotic agents: N/A 5. Neuropsych: This patient is capable of making decisions on her own behalf. 6. Skin/Wound Care: Maintain adequate nutrition and hydration status.              --PRAFO's/ pressure-relief measures for heels, lower legs, sacrum             -OOB 7. Fluids/Electrolytes/Nutrition:  monitor I/O. Check CMET on 03/06 8.  Acute systolic heart failure: initial ECHO with EF 30-45% but repeat demonstrated EF 65-70%. -Monitor weights daily.   -Low-salt diet.   -Monitor for signs of overload             --on ASA, metoprolol and low dose aldactone.              -outpt cards follow up 9.  Legionella PNA: Hypoxic respiratory resolved and has completed Levaquin for treatment. 10.  Stress induced hyperglycemia: Hemoglobin A1c 5.5. Likely due to tube feeds.  -- Continue to monitor blood sugars ac/hs             -- Continue insulin glargine with SSI and wean off as activity improve.  11.  COPD: Continue Incruse and Breo. Wean oxygen as table to keep sats > 88%  12. Acute renal failure: avoid nephrotoxic meds. SCr ranging 3.4 to 0.41 despite fluids.  --recheck 03/06 . 13. Abnormal LFTs: Likely due to alcohol use and shock-->improving.             --  Continue to trend for recovery. 14. Diabetes:   -semglee 5u daily  -SSI       Bary Leriche, PA-C 12/16/2021   I have personally performed a face to face diagnostic evaluation of this patient and formulated the key components of the plan.  Additionally, I have personally reviewed laboratory data, imaging studies, as well as relevant notes and concur with the physician assistant's documentation above.  The patient's status has not changed from the original H&P.  Any changes in documentation from the acute care chart have been noted above.  Meredith Staggers, MD, Mellody Drown

## 2021-12-19 LAB — COMPREHENSIVE METABOLIC PANEL
ALT: 56 U/L — ABNORMAL HIGH (ref 0–44)
AST: 32 U/L (ref 15–41)
Albumin: 2.4 g/dL — ABNORMAL LOW (ref 3.5–5.0)
Alkaline Phosphatase: 73 U/L (ref 38–126)
Anion gap: 7 (ref 5–15)
BUN: 6 mg/dL — ABNORMAL LOW (ref 8–23)
CO2: 26 mmol/L (ref 22–32)
Calcium: 8.5 mg/dL — ABNORMAL LOW (ref 8.9–10.3)
Chloride: 106 mmol/L (ref 98–111)
Creatinine, Ser: 0.49 mg/dL (ref 0.44–1.00)
GFR, Estimated: >60 mL/min (ref >60)
Glucose, Bld: 86 mg/dL (ref 70–99)
Potassium: 3.2 mmol/L — ABNORMAL LOW (ref 3.5–5.1)
Sodium: 139 mmol/L (ref 135–145)
Total Bilirubin: 0.2 mg/dL — ABNORMAL LOW (ref 0.3–1.2)
Total Protein: 5.7 g/dL — ABNORMAL LOW (ref 6.5–8.1)

## 2021-12-19 LAB — GLUCOSE, CAPILLARY
Glucose-Capillary: 76 mg/dL (ref 70–99)
Glucose-Capillary: 112 mg/dL — ABNORMAL HIGH (ref 70–99)
Glucose-Capillary: 142 mg/dL — ABNORMAL HIGH (ref 70–99)
Glucose-Capillary: 134 mg/dL — ABNORMAL HIGH (ref 70–99)
Glucose-Capillary: 76 mg/dL (ref 70–99)

## 2021-12-19 MED ORDER — JUVEN PO PACK
1.0000 | PACK | Freq: Two times a day (BID) | ORAL | Status: DC
  Administered 2021-12-19: 1 via ORAL
  Filled 2021-12-19 (×2): qty 1

## 2021-12-19 MED ORDER — POTASSIUM CHLORIDE 20 MEQ PO PACK
40.0000 meq | PACK | Freq: Once | ORAL | Status: AC
  Administered 2021-12-19: 40 meq via ORAL
  Filled 2021-12-19: qty 2

## 2021-12-19 MED ORDER — BENEPROTEIN PO POWD
1.0000 | Freq: Two times a day (BID) | ORAL | Status: DC
  Administered 2021-12-19 – 2021-12-21 (×4): 6 g via ORAL
  Filled 2021-12-19: qty 227

## 2021-12-19 MED ORDER — POTASSIUM CHLORIDE 20 MEQ PO PACK
40.0000 meq | PACK | Freq: Two times a day (BID) | ORAL | Status: DC
  Administered 2021-12-19: 40 meq via ORAL
  Filled 2021-12-19 (×2): qty 2

## 2021-12-19 NOTE — Progress Notes (Signed)
?                                                       PROGRESS NOTE ? ? ?Subjective/Complaints: ? ?She is eager to go home- messaged team to start discharge planning- CG with ADLs today ?Denies complaints ? ?ROS: denies pain ? ?Objective: ?  ?No results found. ?No results for input(s): WBC, HGB, HCT, PLT in the last 72 hours. ?Recent Labs  ?  12/19/21 ?0600  ?NA 139  ?K 3.2*  ?CL 106  ?CO2 26  ?GLUCOSE 86  ?BUN 6*  ?CREATININE 0.49  ?CALCIUM 8.5*  ? ? ?Intake/Output Summary (Last 24 hours) at 12/19/2021 1120 ?Last data filed at 12/19/2021 0700 ?Gross per 24 hour  ?Intake 240 ml  ?Output --  ?Net 240 ml  ?  ? ?  ? ?Physical Exam: ?Vital Signs ?Blood pressure 126/67, pulse 80, temperature 98.4 ?F (36.9 ?C), temperature source Oral, resp. rate 18, weight 45.3 kg, SpO2 93 %. ?Gen: no distress, normal appearing ?HEENT: oral mucosa pink and moist, NCAT ?Cardio: Reg rate ?Chest: normal effort, normal rate of breathing ?Abd: soft, non-distended ?Ext: no edema ?Psych: pleasant, normal affect ?Skin: ?   General: Skin is warm and dry.  ?   Comments: Small stg 1 sacrum. Skin tears bilateral lower legs  ?Neurological:  ?   General: No focal deficit present.  ?   Mental Status: She is oriented to person, place, and time.  ?   Cranial Nerves: No cranial nerve deficit.  ?   Sensory: No sensory deficit.  ?   Comments: Motor 4-5/5 UE, 4/5 LE prox to distal. No abnl tone. Normal coordination.   ?Psychiatric:     ?   Mood and Affect: Mood normal.     ?   Behavior: Behavior normal.     ?   Thought Content: Thought content normal.     ?   Judgment: Judgment normal. ? ? ?Assessment/Plan: ?1. Functional deficits which require 3+ hours per day of interdisciplinary therapy in a comprehensive inpatient rehab setting. ?Physiatrist is providing close team supervision and 24 hour management of active medical problems listed below. ?Physiatrist and rehab team continue to assess barriers to discharge/monitor patient progress toward functional and  medical goals ? ?Care Tool: ? ?Bathing ?   ?Body parts bathed by patient: Right arm, Left arm, Chest, Abdomen, Front perineal area, Buttocks, Right upper leg, Left upper leg, Right lower leg, Left lower leg, Face  ?   ?  ?  ?Bathing assist Assist Level: Contact Guard/Touching assist ?  ?  ?Upper Body Dressing/Undressing ?Upper body dressing   ?What is the patient wearing?: Pull over shirt ?   ?Upper body assist Assist Level: Set up assist ?   ?Lower Body Dressing/Undressing ?Lower body dressing ? ? ?   ?What is the patient wearing?: Pants, Underwear/pull up ? ?  ? ?Lower body assist Assist for lower body dressing: Contact Guard/Touching assist ?   ? ?Toileting ?Toileting    ?Toileting assist Assist for toileting: Contact Guard/Touching assist ?  ?  ?Transfers ?Chair/bed transfer ? ?Transfers assist ?   ? ?Chair/bed transfer assist level: Contact Guard/Touching assist ?  ?  ?Locomotion ?Ambulation ? ? ?Ambulation assist ? ?   ? ?  ?  ?   ? ?Walk 10  feet activity ? ? ?Assist ?   ? ?  ?   ? ?Walk 50 feet activity ? ? ?Assist   ? ?  ?   ? ? ?Walk 150 feet activity ? ? ?Assist   ? ?  ?  ?  ? ?Walk 10 feet on uneven surface  ?activity ? ? ?Assist   ? ? ?  ?   ? ?Wheelchair ? ? ? ? ?Assist Is the patient using a wheelchair?: No ?  ?  ? ?  ?   ? ? ?Wheelchair 50 feet with 2 turns activity ? ? ? ?Assist ? ?  ?  ? ? ?   ? ?Wheelchair 150 feet activity  ? ? ? ?Assist ?   ? ? ?   ? ?Blood pressure 126/67, pulse 80, temperature 98.4 ?F (36.9 ?C), temperature source Oral, resp. rate 18, weight 45.3 kg, SpO2 93 %. ? ?Medical Problem List and Plan: ?1. Functional deficits secondary to debility after Acute respiratory failure d/t legionella pneumonia ?            -patient may shower ?            -ELOS/Goals: 5 days, mod I ?2.  Antithrombotics: ?-DVT/anticoagulation:  Pharmaceutical: Lovenox ?            -antiplatelet therapy: asa ?3. Pain Management: tylenol prn.  ?4. Mood: LCSW to follow for evaluation and support.  ?             -antipsychotic agents: N/A ?5. Neuropsych: This patient is capable of making decisions on her own behalf. ?6. Skin/Wound Care: Maintain adequate nutrition and hydration status.  ?            --PRAFO's/ pressure-relief measures for heels, lower legs, sacrum ?            -OOB ?7. Fluids/Electrolytes/Nutrition: monitor I/O. Check CMET on 03/06 ?8.  Acute systolic heart failure: initial ECHO with EF 30-45% but repeat demonstrated EF 65-70%. ?-Monitor weights daily.   ?-Low-salt diet.   ?-Monitor for signs of overload ?            --on ASA, metoprolol and low dose aldactone.  ?            -outpt cards follow up ?9.  Legionella PNA: Hypoxic respiratory resolved and has completed Levaquin for treatment. ?10.  Stress induced hyperglycemia: Hemoglobin A1c 5.5. Likely due to tube feeds.  ?-- Continue to monitor blood sugars ac/hs ?            -- d/c semglee ?11.  COPD: Continue Incruse and Breo. Wean oxygen as table to keep sats > 88%  ?12. Acute renal failure: avoid nephrotoxic meds. SCr ranging 3.4 to 0.41 despite fluids.  ?--recheck 03/06 . ?13. Abnormal LFTs: Likely due to alcohol use and shock-->improving. ?            -- Continue to trend for recovery. ? -discussed excellent improvements with her ?14. Hypokalemia: supplement BID today ?15. Hypoalbuminemia: encouraged high protein foods ? ? ?LOS: ?1 days ?A FACE TO FACE EVALUATION WAS PERFORMED ? ?Clint Bolder P Trisa Cranor ?12/19/2021, 11:20 AM  ? ?  ?

## 2021-12-19 NOTE — Evaluation (Signed)
Physical Therapy Assessment and Plan  Patient Details  Name: Gina Cherry MRN: [id number] Date of Birth: [birth year]  PT Diagnosis: Difficulty walking and Muscle weakness Rehab Potential: Good ELOS: 3-5 Days   Today's Date: 12/19/2021 PT Individual Time: 1301-1400 PT Individual Time Calculation (min): 59 min    Hospital Problem: Principal Problem:   Debility   Past Medical History:  Past Medical History:  Diagnosis Date   COPD (chronic obstructive pulmonary disease) ([place])    Osteoporosis    Underweight    Past Surgical History: No past surgical history on file.  Assessment & Plan Clinical Impression: Patient is a 66 year old female with history of COPD w/ tobacco use,  osteoporosis who was admitted on 11/28/21 with reports of multiple falls, SOB w/cough, generalized weakness and diarrhea. She was noted to have bilateral CAP with leukocytosis and elevated LFTs with hypokalemia.  She was started on broad-spectrum antibiotics but developed ARDS due to septic shock and was intubated and required proning due to worsening of ARDS with face discoloration.  She was found to have Legionella PNA and antibiotics narrowed to levofloxacin.  She developed acute systolic heart failure from Takotsubo cardiomyopathy with EF 30 to 35% in setting of acute sepsis.    Dr. Haroldine Laws consulted to manage fluid overload and repeat echo 2/20 showed improvement in EF to 65-70%.  Elevated trops felt to be due to demand ischemia and question of After recovery.  Metoprolol was added to manage sinus tachycardia.  She tolerated extubation and was weaned off pressors.  MBS on 02/27 and she was started on regular diet with nectar liquids was advanced to thin liquids  as swallow function improved.  Respiratory status improving and she has been weaned down to 2 L per [place] with activity.  She continues to be limited by weakness and balance deficits due to recent illness.   Patient transferred to CIR on 12/18/2021 .    Patient currently requires  CGA  with mobility secondary to muscle weakness, decreased cardiorespiratoy endurance, and decreased standing balance and decreased balance strategies.  Prior to hospitalization, patient was independent  with mobility and lived with Significant other in a House home.  Home access is 3Stairs to enter.  Patient will benefit from skilled PT intervention to maximize safe functional mobility, minimize fall risk, and decrease caregiver burden for planned discharge home with intermittent assist.  Anticipate patient will benefit from follow up OP at discharge.  PT - End of Session Activity Tolerance: Tolerates 30+ min activity with multiple rests Endurance Deficit: Yes Endurance Deficit Description: Decreased activity tolerance. Poor activity pacing. PT Assessment Rehab Potential (ACUTE/IP ONLY): Good PT Patient demonstrates impairments in the following area(s): Balance;Endurance;Motor;Safety;Sensory PT Transfers Functional Problem(s): Bed Mobility;Bed to Chair;Car;Furniture PT Locomotion Functional Problem(s): Ambulation;Stairs PT Plan PT Intensity: Minimum of 1-2 x/day ,45 to 90 minutes PT Frequency: 5 out of 7 days PT Duration Estimated Length of Stay: 3-5 Days PT Treatment/Interventions: Ambulation/gait training;Community reintegration;DME/adaptive equipment instruction;Neuromuscular re-education;Psychosocial support;Stair training;UE/LE Strength taining/ROM;Balance/vestibular training;Discharge planning;Skin care/wound management;Therapeutic Activities;UE/LE Coordination activities;Disease management/prevention;Functional mobility training;Patient/family education;Therapeutic Exercise PT Transfers Anticipated Outcome(s): mod(I) PT Locomotion Anticipated Outcome(s): mod(I) PT Recommendation Follow Up Recommendations: Outpatient PT Patient destination: Home Equipment Recommended: To be determined   PT Evaluation Precautions/Restrictions Precautions Precautions:  None Precaution Comments: watch SpO2 Restrictions Weight Bearing Restrictions: No General Chart Reviewed: Yes Family/Caregiver Present: No  Pain Pain Assessment Pain Score: 0-No pain Pain Interference Pain Interference Pain Effect on Sleep: 1. Rarely or not at all Pain Interference  with Therapy Activities: 1. Rarely or not at all Pain Interference with Day-to-Day Activities: 1. 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: Intact  Cognition Overall Cognitive Status: Within Functional Limits for tasks assessed Arousal/Alertness: Awake/alert Orientation Level: Oriented X4 Year: 2023 Month: March Day of Week: Correct Memory: Appears intact Memory Impairment: Decreased short term memory Decreased Short Term Memory: Verbal complex Immediate Memory Recall: Sock;Blue;Bed Memory Recall Sock: Not able to recall Memory Recall Blue: Without Cue Memory Recall Bed: Without Cue Awareness: Appears intact Problem Solving: Appears intact Safety/Judgment: Appears intact Comments: Mildly decreased knowlege of need for extenal assist. Sensation Sensation Light Touch: Appears Intact Coordination Gross Motor Movements are Fluid and Coordinated: Yes Fine Motor Movements are Fluid and Coordinated: Yes Finger Nose Finger Test: Intact Motor  Motor Motor: Within Functional Limits  Trunk/Postural Assessment  Cervical Assessment Cervical Assessment: Within Functional Limits Thoracic Assessment Thoracic Assessment: Within Functional Limits Lumbar Assessment Lumbar Assessment: Within Functional Limits Postural Control Postural Control: Deficits on evaluation (Delayed, insufficient)   Balance Balance Balance Assessed: Yes Standardized Balance Assessment Standardized Balance Assessment: Berg Balance Test Berg Balance Test Sit to Stand: Able to stand without using hands and stabilize independently Standing Unsupported: Able to stand 2 minutes with supervision Sitting with Back Unsupported but Feet Supported on Floor or Stool: Able to sit safely and securely 2 minutes Stand to Sit: Controls descent by using hands Transfers: Able to transfer with verbal cueing and /or supervision Standing Unsupported with Eyes Closed: Able to stand 10 seconds with supervision Standing Ubsupported with Feet Together: Able to place feet together independently and stand for 1 minute with supervision From Standing, Reach Forward with Outstretched Arm: Reaches forward but needs supervision From Standing Position, Pick up Object from Floor: Able to pick up shoe, needs supervision From Standing Position, Turn to Look Behind Over each Shoulder: Looks behind from both sides and weight shifts well Turn 360 Degrees: Able to turn 360 degrees safely but slowly Standing Unsupported, Alternately Place Feet on Step/Stool: Able to stand independently and complete 8 steps >20 seconds Standing Unsupported, One Foot in Front: Able to plae foot ahead of the other independently and hold 30 seconds Standing on One Leg: Tries to lift leg/unable to hold 3 seconds but remains standing independently Total Score: 39 Static Sitting Balance Static Sitting - Balance Support: No upper extremity supported;Feet supported Static Sitting - Level of Assistance: 7: Independent Dynamic Sitting Balance Dynamic Sitting - Balance Support: No upper extremity supported;Feet supported Dynamic Sitting - Level of Assistance: 7: Independent Static Standing Balance Static Standing - Balance Support: No upper extremity supported Static Standing - Level of Assistance: 5: Stand by assistance Dynamic Standing Balance Dynamic Standing -  Balance Support: No upper extremity supported Dynamic Standing - Level of Assistance: 5: Stand by assistance Extremity Assessment  RUE Assessment RUE Assessment: Within Functional Limits General Strength Comments: MMT grossly 4+/5 LUE Assessment LUE Assessment: Within Functional Limits General Strength Comments: MMT grossly 4+/5 RLE Assessment RLE Assessment: Exceptions to [facility] General Strength Comments: Hips 4/5, Otherwise grossly WFL LLE Assessment LLE Assessment: Exceptions to [facility] General Strength Comments: Hips 4/5, otherwise grossly [facility]  Care Tool Care Tool Bed Mobility Roll left and right activity   Roll left and right assist level: Independent    Sit to lying  activity   Sit to lying assist level: Independent    Lying to sitting on side of bed activity   Lying to sitting on side of bed assist level: the ability to move from lying on the back to sitting on the side of the bed with no back support.: Independent     Care Tool Transfers Sit to stand transfer   Sit to stand assist level: Contact Guard/Touching assist    Chair/bed transfer   Chair/bed transfer assist level: Contact Guard/Touching assist     Toilet transfer   Assist Level: Contact Guard/Touching assist    Car transfer   Car transfer assist level: Contact Guard/Touching assist      Care Tool Locomotion Ambulation   Assist level: Contact Guard/Touching assist Assistive device: No Device Max distance: 781'  Walk 10 feet activity   Assist level: Contact Guard/Touching assist Assistive device: No Device   Walk 50 feet with 2 turns activity   Assist level: Contact Guard/Touching assist Assistive device: No Device  Walk 150 feet activity   Assist level: Contact Guard/Touching assist Assistive device: No Device  Walk 10 feet on uneven surfaces activity   Assist level: Contact Guard/Touching assist    Stairs   Assist level: Contact Guard/Touching assist Stairs assistive device: 2 hand rails Max  number of stairs: 12  Walk up/down 1 step activity   Walk up/down 1 step (curb) assist level: Contact Guard/Touching assist    Walk up/down 4 steps activity   Walk up/down 4 steps assist level: Contact Guard/Touching assist Walk up/down 4 steps assistive device: 2 hand rails  Walk up/down 12 steps activity   Walk up/down 12 steps assist level: Contact Guard/Touching assist Walk up/down 12 steps assistive device: 2 hand rails  Pick up small objects from floor        Wheelchair Is the patient using a wheelchair?: No          Wheel 50 feet with 2 turns activity      Wheel 150 feet activity        Refer to Care Plan for Long Term Goals  SHORT TERM GOAL WEEK 1 PT Short Term Goal 1 (Week 1): STGs = LTGs  Recommendations for other services: None   Skilled Therapeutic Intervention  Evaluation completed (see details above and below) with education on PT POC and goals and individual treatment initiated with focus on balance, transfers, ambulation, and stair training.  Pt received seated in recliner and agrees to therapy. No complaint of pain. Sit to stand and ambulatory transfer to toilet with CGA and cue for positioning. WC transport to gym for time management. Pt completes car transfer and ramp navigation with CGA and cues for sequencing. Following seated rest break, pt performs 6 minute walk test and scores 781'. PT provides CGA throughout test and cues for upright gaze to improve posture and balance, and increasing stride length to decrease risk for falls. Pt completes x12 6" steps with bilateral hand rails and CGA with cues for step sequencing. Pt completes BERG balance test, as detailed below. Pt left seated in recliner with alarm intact and all needs within reach.   Mobility Bed Mobility Bed Mobility: Rolling Right;Rolling Left;Supine to Sit Rolling Right: Independent Rolling Left: Independent Supine to Sit: Independent Transfers Transfers: Sit to Stand;Stand to Sit Sit to  Stand: Contact Guard/Touching assist Stand to Sit: Contact Guard/Touching assist Transfer (Assistive device): Other (Comment) (HHA) Locomotion  Gait Ambulation: Yes Gait Assistance: Contact Guard/Touching assist Gait Distance (Feet):  781 Feet Assistive device: None Gait Gait: Yes Gait Pattern: Impaired Gait Pattern: Wide base of support;Decreased stride length Stairs / Additional Locomotion Stairs: Yes Stairs Assistance: Contact Guard/Touching assist Stair Management Technique: Two rails Number of Stairs: 12 Height of Stairs: 6 Ramp: Contact Guard/touching assist Curb: Contact Guard/Touching assist Wheelchair Mobility Wheelchair Mobility: No   Discharge Criteria: Patient will be discharged from PT if patient refuses treatment 3 consecutive times without medical reason, if treatment goals not met, if there is a change in medical status, if patient makes no progress towards goals or if patient is discharged from hospital.  The above assessment, treatment plan, treatment alternatives and goals were discussed and mutually agreed upon: by patient  Breck Coons, PT, DPT 12/19/2021, 4:06 PM

## 2021-12-19 NOTE — Progress Notes (Signed)
Inpatient Rehabilitation Care Coordinator ?Assessment and Plan ?Patient Details  ?Name: Gina Cherry ?MRN: [id number] ?Date of Birth: [birth year] ? ?Today's Date: 12/19/2021 ? ?Hospital Problems: Principal Problem: ?  Debility ? ?Past Medical History:  ?Past Medical History:  ?Diagnosis Date  ? COPD (chronic obstructive pulmonary disease) ([place])   ? Osteoporosis   ? Underweight   ? ?Past Surgical History: No past surgical history on file. ?Social History:  reports that she has been smoking cigarettes. She has been smoking an average of .5 packs per day. She does not have any smokeless tobacco history on file. She reports current alcohol use. She reports that she does not use drugs. ? ?Family / Support Systems ?Marital Status: Married ?Patient Roles: Spouse ?Spouse/Significant Other: Danley Danker ?Anticipated Caregiver: Ronalee Belts ?Ability/Limitations of Caregiver: none ?Caregiver Availability: 24/7 ?Family Dynamics: support form spouse ? ?Social History ?Preferred language: English ?Religion:  ?Health Literacy - How often do you need to have someone help you when you read instructions, pamphlets, or other written material from your doctor or pharmacy?: Never ?Employment Status: Retired ?Legal History/Current Legal Issues: n/a ?Guardian/Conservator: n/a  ? ?Abuse/Neglect ?Abuse/Neglect Assessment Can Be Completed: Yes ?Physical Abuse: Denies ?Verbal Abuse: Denies ?Sexual Abuse: Denies ?Exploitation of patient/patient's resources: Denies ?Self-Neglect: Denies ? ?Patient response to: ?Social Isolation - How often do you feel lonely or isolated from those around you?: Never ? ?Emotional Status ?Recent Psychosocial Issues: coping ?Psychiatric History: b/a ?Substance Abuse History: hx of smoking, ETOH ? ?Patient / Family Perceptions, Expectations & Goals ?Pt/Family understanding of illness & functional limitations: yes ?Premorbid pt/family roles/activities: patient previously independent ?Pt/family expectations/goals: MOD  I ? ?Community Resources ?Community Agencies: None ?Premorbid Home Care/DME Agencies: None ?Transportation available at discharge: family able to transport ?Is the patient able to respond to transportation needs?: Yes ?In the past 12 months, has lack of transportation kept you from medical appointments or from getting medications?: No ?In the past 12 months, has lack of transportation kept you from meetings, work, or from getting things needed for daily living?: No ? ?Discharge Planning ?Living Arrangements: Spouse/significant other ?Support Systems: Spouse/significant other ?Type of Residence: Private residence ?Insurance Resources: Multimedia programmer (specify) Scientist, clinical (histocompatibility and immunogenetics) Medicare) ?Financial Resources: Fish farm manager, Family Support ?Living Expenses: Lives with family ?Money Management: Spouse, Patient ?Does the patient have any problems obtaining your medications?: No ?Home Management: previosuly independent ?Patient/Family Preliminary Plans: spouse able to to assist with some roles and tasks ?Care Coordinator Barriers to Discharge: Other (comments), New diabetic, Wound Care, Insurance for SNF coverage, Lack of/limited family support, Decreased caregiver support ?Care Coordinator Barriers to Discharge Comments: oxygen, Pressure Injury ?Care Coordinator Anticipated Follow Up Needs: HH/OP ?Expected length of stay: 5 Days ? ?Clinical Impression ?Sw met with patient, introduced self, and explained role. Patient ready to d/c ASAP. Patient will d/c home with spouse to assist 24/7. Patient agrees to HH/OP. No additional questions or concerns. ? ?Dyanne Iha ?12/19/2021, 12:36 PM ? ?  ?

## 2021-12-19 NOTE — Progress Notes (Addendum)
PMR Admission Coordinator Pre-Admission Assessment   Patient: Gina Cherry is an 66 y.o., female MRN: [id number] DOB: [birth year] Height: _0  (162.6 cm) Weight: 45.8 kg   Insurance Information HMO:     PPO: yes     PCP:      IPA:      80/20:      OTHER:  PRIMARY: [employer] Medicare      Policy#: [id number]      Subscriber: patient CM Name: Dewitt Rota               Phone#: [phone]     Fax#: [phone] Pre-Cert#: [id number] [place] for CIR from [place]  at [facility] with updates due to [place]  at fax listed above on 12/22/21      Employer:  Benefits:  Phone #:      Name:  Eff. Date: 10/16/20     Deduct: $-0      Out of Pocket Max: $4500 (met $285)     Life Max: n/a CIR: $295/day for days 1-6      SNF: 20 full days Outpatient:      Co-Pay: $35/visit Home Health: 100%      Co-Pay:  DME: 80%     Co-Ins: 20% Providers: in-network SECONDARY:       Policy#:      Phone#:    Development worker, community:       Phone#:    The [employer] for patients in Inpatient Rehabilitation Facilities with attached Privacy Act [place] Records was provided and verbally reviewed with: Patient and Family   Emergency Contact Information Contact Information       Name Relation Home Work Mobile    Robinson,Mike Significant other     [phone]           Current Medical History  Patient Admitting Diagnosis: sepsis d/t RLL PNA History of Present Illness: Pt is a 66 year old female with medical hx significant for: smoking, ETOH abuse. On 11/28/21, pt presented to hospital after illness x1 week. Pt had a 5d h/o cough, SOB, generalized weakness, diarrhea. Pt also had multiple falls which resulted in her hitting her head and sustaining a laceration and mild headache. Pt required 4L of oxygen. Pt was tachycardic and febrile upon arrival to ED. Pt Chest x-ray showed RLL PNA. Pt increased WOB and placed on HHFNC 100% 35L. Pt had to be placed on 15L NRB in addition to 100% HFNC  on 11/30/21. Cardiology consulted d/t pt's reduced LV dysfunction. EF 30-35%. However, cardiology feels EF is more likely 40-45%. Pt intubated on 11/30/21-12/10/21. Pt was able to be weaned to HFNC. Chest x-ray showed bilateral PNA R>L plus small left pleural effusion and moderate on right. Tube feeds started on 12/01/21. Repeat Echo on 2/20 showed EF 65-70%. Pt began PO diet (regular/NTL)  on 12/12/21.  Therapy evaluations completed and CIR recommended d/t pt's deficits in functional mobility and inability to complete ADLs independently.   Patient's medical record from [facility] has been reviewed by the rehabilitation admission coordinator and physician.   Past Medical History      Past Medical History:  Diagnosis Date   COPD (chronic obstructive pulmonary disease) ([place])     Osteoporosis     Underweight        Has the patient had major surgery during 100 days prior to admission? No   Family History   family history is not on file.   Current Medications  Current Facility-Administered Medications:    acetaminophen (TYLENOL) tablet 650 mg, 650 mg, Oral, Q6H PRN, Virl Axe, MD   aspirin chewable tablet 81 mg, 81 mg, Oral, Daily, Virl Axe, MD, 81 mg at 12/15/21 0954   bisacodyl (DULCOLAX) suppository 10 mg, 10 mg, Rectal, Daily PRN, Agarwala, Ravi, MD   chlorhexidine (PERIDEX) 0.12 % solution 15 mL, 15 mL, Mouth Rinse, BID, Laqueta Jean, MD, 15 mL at 12/14/21 2149   Chlorhexidine Gluconate Cloth 2 % PADS 6 each, 6 each, Topical, Q0600, Collier Bullock, MD, 6 each at 12/15/21 0609   docusate (COLACE) 50 MG/5ML liquid 100 mg, 100 mg, Oral, Daily PRN, Virl Axe, MD   enoxaparin (LOVENOX) injection 30 mg, 30 mg, Subcutaneous, Q24H, McQuaid, Douglas B, MD, 30 mg at 12/15/21 0953   fluticasone furoate-vilanterol (BREO ELLIPTA) 200-25 MCG/ACT 1 puff, 1 puff, Inhalation, Daily, Christian, Rylee, MD, 1 puff at 12/15/21 0900   insulin aspart (novoLOG) injection 0-9 Units,  0-9 Units, Subcutaneous, Q4H, Laqueta Jean, MD, 2 Units at 12/15/21 0444   insulin glargine-yfgn (SEMGLEE) injection 5 Units, 5 Units, Subcutaneous, Daily, Jennelle Human B, NP, 5 Units at 12/15/21 0950   ipratropium-albuterol (DUONEB) 0.5-2.5 (3) MG/3ML nebulizer solution 3 mL, 3 mL, Nebulization, Q4H PRN, Dareen Piano, Nischal, MD, 3 mL at 12/09/21 0037   MEDLINE mouth rinse, 15 mL, Mouth Rinse, q12n4p, Laqueta Jean, MD, 15 mL at 12/14/21 1700   metoprolol tartrate (LOPRESSOR) tablet 25 mg, 25 mg, Oral, BID, Virl Axe, MD, 25 mg at 12/15/21 9628   nicotine (NICODERM CQ - dosed in mg/24 hours) patch 21 mg, 21 mg, Transdermal, Daily, Christian, Rylee, MD, 21 mg at 12/15/21 3662   oxyCODONE-acetaminophen (PERCOCET/ROXICET) 5-325 MG per tablet 2 tablet, 2 tablet, Oral, Q6H PRN, Virl Axe, MD   spironolactone (ALDACTONE) tablet 12.5 mg, 12.5 mg, Oral, Daily, Clegg, Amy D, NP, 12.5 mg at 12/15/21 0954   umeclidinium bromide (INCRUSE ELLIPTA) 62.5 MCG/ACT 1 puff, 1 puff, Inhalation, Daily, Christian, Rylee, MD, 1 puff at 12/15/21 0859   white petrolatum (VASELINE) gel, , Topical, PRN, Kipp Brood, MD   Patients Current Diet:  Diet Order                  Diet regular Room service appropriate? Yes; Fluid consistency: Nectar Thick  Diet effective now                         Precautions / Restrictions Precautions Precautions: Fall Precaution Comments: NPO Restrictions Weight Bearing Restrictions: No Other Position/Activity Restrictions: watch sats and HR    Has the patient had 2 or more falls or a fall with injury in the past year? Yes   Prior Activity Level   Prior Functional Level Self Care: Did the patient need help bathing, dressing, using the toilet or eating? Independent   Indoor Mobility: Did the patient need assistance with walking from room to room (with or without device)? Independent   Stairs: Did the patient need assistance with internal or external  stairs (with or without device)? Independent   Functional Cognition: Did the patient need help planning regular tasks such as shopping or remembering to take medications? Independent Patient Information Are you of Hispanic, Latino/a,or Spanish origin?: A. No, not of Hispanic, Latino/a, or Spanish origin What is your race?: A. White Do you need or want an interpreter to communicate with a doctor or health care staff?: 0. No   Patient's Response To:  Health Literacy and Transportation Is the patient able to respond to health literacy and transportation needs?: Yes Health Literacy - How often do you need to have someone help you when you read instructions, pamphlets, or other written material from your doctor or pharmacy?: Never In the past 12 months, has lack of transportation kept you from medical appointments or from getting medications?: No In the past 12 months, has lack of transportation kept you from meetings, work, or from getting things needed for daily living?: No     Development worker, international aid / [place] Devices/Equipment: None Home Equipment: None   Prior Device Use: Indicate devices/aids used by the patient prior to current illness, exacerbation or injury? None of the above   Current Functional Level Cognition   Overall Cognitive Status: Within Functional Limits for tasks assessed Difficult to assess due to: Intubated, Impaired communication Orientation Level: Oriented X4 Safety/Judgement: Decreased awareness of safety, Decreased awareness of deficits General Comments: following commands and interactive, still reaches to her mouth, but not always trying to pull tubes    Extremity Assessment (includes Sensation/Coordination)   Upper Extremity Assessment: Overall WFL for tasks assessed  Lower Extremity Assessment: Defer to PT evaluation     ADLs   Overall ADL's : Needs assistance/impaired Eating/Feeding: NPO Grooming: Set up, Sitting Upper Body Bathing:  Minimal assistance, Sitting Lower Body Bathing: Moderate assistance, Sitting/lateral leans Upper Body Dressing : Minimal assistance, Sitting Lower Body Dressing: Moderate assistance, Minimal assistance, Sit to/from stand Lower Body Dressing Details (indicate cue type and reason): Pt can now cross her legs to get to her feet while seated EOB--can doff but can't donn socks as of yet Toilet Transfer: Stand-pivot, Rolling walker (2 wheels), Minimal assistance Toileting- Clothing Manipulation and Hygiene: Maximal assistance Functional mobility during ADLs: Moderate assistance, +2 for safety/equipment General ADL Comments: easily fatigues; complains of dizziness     Mobility   Overal bed mobility: Needs Assistance Bed Mobility: Supine to Sit Rolling: Mod assist, Max assist, +2 for physical assistance Supine to sit: Supervision, HOB elevated Sit to supine: Supervision General bed mobility comments: up in chair after OT     Transfers   Overall transfer level: Needs assistance Equipment used: Rolling walker (2 wheels) Transfers: Sit to/from Stand Sit to Stand: Min guard Bed to/from chair/wheelchair/BSC transfer type:: Step pivot Stand pivot transfers: Min assist Step pivot transfers: Min assist  Lateral/Scoot Transfers: Mod assist, +2 physical assistance General transfer comment: up to stand from recliner to [place] for balance, stepping to [facility] with A for lines and to steady with RW     Ambulation / Gait / Stairs / Wheelchair Mobility   Ambulation/Gait Ambulation/Gait assistance: Herbalist (Feet): 22 Feet Assistive device: Rolling walker (2 wheels) Gait Pattern/deviations: Step-through pattern, Step-to pattern, Decreased stride length, Wide base of support, Decreased dorsiflexion - right, Decreased dorsiflexion - left, Trunk flexed General Gait Details: able to walk to door and back to chair with RW and stopping x 1 to flex ankles due to stiffness Pre-gait activities: pt  marching in place, reduced foot clearance bilaterally, posterior lean     Posture / Balance Dynamic Sitting Balance Sitting balance - Comments: sitting EOB with hands beside and no support to trunk Balance Overall balance assessment: Modified Independent Sitting-balance support: No upper extremity supported, Feet supported Sitting balance-Leahy Scale: Good Sitting balance - Comments: sitting EOB with hands beside and no support to trunk Postural control: Other (comment) (anterior lean) Standing balance support: Reliant on assistive device  for balance, Bilateral upper extremity supported Standing balance-Leahy Scale: Poor Standing balance comment: maintains a wide base of support, and continues to initially have difficulty shifting her weight forward on initial stand.  Unable to control descent for sitting.     Special needs/care consideration Oxygen 4L HFNC, Skin Weeping: arm/right; Skin tear: pretibial/left; Pressure injury stage 1: sacrum/mid; MASD: perineum/right,left; Diabetic management yes    Previous Home Environment (from acute therapy documentation) Living Arrangements: Spouse/significant other Available Help at Discharge: Family, Available 24 hours/day Type of Home: House Home Layout: One level Home Access: Stairs to enter Entrance Stairs-Rails: Left Entrance Stairs-Number of Steps: 3 Bathroom Shower/Tub: Tub/shower unit, Charity fundraiser: [place]: No Additional Comments: partner is Consulting civil engineer. They are both retired. Just got back from vacation at [facility]   Discharge Living Setting Does the patient have any problems obtaining your medications?: No   Social/Family/Support Systems Patient Roles: Spouse Anticipated Caregiver: Ronalee Belts Anticipated Caregiver's Contact Information: [phone] Ability/Limitations of Caregiver: none Caregiver Availability: 24/7 Discharge Plan Discussed with Primary Caregiver: Yes Is Caregiver In Agreement with Plan?: Yes Does  Caregiver/Family have Issues with Lodging/Transportation while Pt is in Rehab?: No   Goals Patient/Family Goal for Rehab: PT/OT supervision to mod I, SLP n/a Expected length of stay: 5 Days Pt/Family Agrees to Admission and willing to participate: Yes Program Orientation Provided & Reviewed with Pt/Caregiver Including Roles  & Responsibilities: Yes  Barriers to Discharge: Insurance for SNF coverage   Decrease burden of Care through IP rehab admission: NA   Possible need for SNF placement upon discharge: Not anticipated   Patient Condition: I have reviewed medical records from [facility], spoken with CSW, and patient and spouse. I met with patient at the bedside for inpatient rehabilitation assessment.  Patient will benefit from ongoing PT, OT, and SLP, can actively participate in 3 hours of therapy a day 5 days of the week, and can make measurable gains during the admission.  Patient will also benefit from the coordinated team approach during an Inpatient Acute Rehabilitation admission.  The patient will receive intensive therapy as well as Rehabilitation physician, nursing, social worker, and care management interventions.  Due to bladder management, safety, skin/wound care, disease management, medication administration, pain management, and patient education the patient requires 24 hour a day rehabilitation nursing.  The patient is currently min assist to min guard with mobility and basic ADLs.  Discharge setting and therapy post discharge at home with home health is anticipated.  Patient has agreed to participate in the Acute Inpatient Rehabilitation Program and will admit today.   Preadmission Screen Completed By:  Shann Medal, PT, DPT, 12/15/2021 10:21 AM with day of updates by Clemens Catholic, MS, CCC-SLP   ______________________________________________________________________   Discussed status with Dr. Naaman Plummer on 12/18/21 at 745 and received approval for admission today.   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 Yes Co-Morbidities requiring supervision/potential complications: pneumonia, sCHF/CM, sepsis Due to bladder management, bowel management, safety, skin/wound care, disease management, medication administration, pain management, and patient education, does the patient require 24 hr/day rehab nursing? Yes Does the patient require coordinated care of a physician, rehab nurse, PT, OT, and SLP to address physical and functional deficits in  the context of the above medical diagnosis(es)? Yes Addressing deficits in the following areas: balance, endurance, locomotion, strength, transferring, bowel/bladder control, bathing, dressing, feeding, grooming, toileting, and psychosocial support Can the patient actively participate in an intensive therapy program of at least 3 hrs of therapy 5 days a week? Yes The potential for patient to make measurable gains while on inpatient rehab is excellent Anticipated functional outcomes upon discharge from inpatient rehab: modified independent PT, modified independent OT, n/a SLP Estimated rehab length of stay to reach the above functional goals is: 5-7 days Anticipated discharge destination: Home 10. Overall Rehab/Functional Prognosis: excellent     MD Signature: Meredith Staggers, MD, [place] Director Rehabilitation Services 12/18/2021

## 2021-12-19 NOTE — Discharge Instructions (Addendum)
Inpatient Rehab Discharge Instructions ? ?Gina Cherry ?Discharge date and time:  12/21/21 ? ?Activities/Precautions/ Functional Status: ?Activity: no lifting, driving, or strenuous exercise till cleared by MD ?Diet: cardiac diet and diabetic diet ?Wound Care: none needed ? ? ?Functional status:  ?___ No restrictions     ___ Walk up steps independently ?_X__ 24/7 supervision/assistance   ___ Walk up steps with assistance ?___ Intermittent supervision/assistance  ___ Bathe/dress independently ?___ Walk with walker     ___ Bathe/dress with assistance ?___ Walk Independently    ___ Shower independently ?_X__ Walk with assistance    _X__ Shower with assistance ?_X__ No alcohol     ___ Return to work/school ________ ? ?COMMUNITY REFERRALS UPON DISCHARGE:   ? ? ?Outpatient: PT     OT                Agency: Outpatient at [facility]  Phone: [phone]  ?            Appointment Date/Time: TBD ? ?Medical Equipment/Items Ordered: Agricultural consultant and Bedside Commode  ?                                                Agency/Supplier: [employer] [phone] ? ? ?Special Instructions: ? ? ? ?My questions have been answered and I understand these instructions. I will adhere to these goals and the provided educational materials after my discharge from the hospital. ? ?Patient/Caregiver Signature _______________________________ Date __________ ? ?Clinician Signature _______________________________________ Date __________ ? ?Please bring this form and your medication list with you to all your follow-up doctor's appointments.   ?

## 2021-12-19 NOTE — Evaluation (Signed)
Speech Language Pathology Assessment and Plan  Patient Details  Name: Gina Cherry MRN: [id number] Date of Birth: [birth year]  SLP Diagnosis:   N/A Rehab Potential:   N/A ELOS:   N/A   Today's Date: 12/19/2021 SLP Individual Time: 0938-1829 SLP Individual Time Calculation (min): 40 min   Hospital Problem: Principal Problem:   Debility  Past Medical History:  Past Medical History:  Diagnosis Date   COPD (chronic obstructive pulmonary disease) ([place])    Osteoporosis    Underweight    Past Surgical History: No past surgical history on file.  Assessment / Plan / Recommendation Clinical Impression  Pt is a 66 year old female with medical hx significant for: smoking, ETOH abuse. On 11/28/21, pt presented to hospital after illness x1 week. Pt had a 5d h/o cough, SOB, generalized weakness, diarrhea. Pt also had multiple falls which resulted in her hitting her head and sustaining a laceration and mild headache. Pt required 4L of oxygen. Pt was tachycardic and febrile upon arrival to ED. Pt Chest x-ray showed RLL PNA. Pt increased WOB and placed on HHFNC 100% 35L. Pt had to be placed on 15L NRB in addition to 100% HFNC on 11/30/21. Cardiology consulted d/t pt's reduced LV dysfunction. EF 30-35%. However, cardiology feels EF is more likely 40-45%. Pt intubated on 11/30/21-12/10/21. Pt was able to be weaned to HFNC. Chest x-ray showed bilateral PNA R>L plus small left pleural effusion and moderate on right. Tube feeds started on 12/01/21. Repeat Echo on 2/20 showed EF 65-70%. Pt began PO diet (regular/NTL)  on 12/12/21.  Therapy evaluations completed and CIR recommended d/t pt's deficits in functional mobility and inability to complete ADLs independently.  Pt demonstrated WFL oral motor skills and WFL swallow function on least restrictive diet of regular textures and thin liquids via cup/straw. Pt demonstrated appropriate mastication, oral clearance, swallow timing appeared swift and no overt s/s  aspiration noted. Pt and pt's husband support improvement in vocal quality, only hoarseness remains as mild deficit and pt demonstrates mod I range in vocal intensity with 100% intelligibility. Pt and pt's husband support baseline cognitive linguistic skills and pt demonstrated appropriate anticipatory awareness in complex conversation. Pt politely declined formal cognitive assessment. No further ST services are needed at this time during CIR length of stay.    Skilled Therapeutic Interventions          Education was completed with pt and pt's husband pertaining to recommendations for swallow strategies. All questions answered to satisfaction, no further ST services needed at this time.   SLP Assessment  Patient does not need any further Speech Lanaguage Pathology Services    Recommendations  SLP Diet Recommendations: Age appropriate regular solids;Thin Liquid Administration via: Straw;Cup Medication Administration: Whole meds with liquid Supervision: Patient able to self feed Compensations: Slow rate;Small sips/bites Postural Changes and/or Swallow Maneuvers: Seated upright 90 degrees Oral Care Recommendations: Oral care BID Patient destination: Home Follow up Recommendations: None Equipment Recommended: None recommended by SLP    SLP Frequency   N/A  SLP Duration  SLP Intensity  SLP Treatment/Interventions   N/A   N/A    N/A   Pain Pain Assessment Pain Score: 0-No pain  Prior Functioning Cognitive/Linguistic Baseline: Within functional limits Type of Home: House  Lives With: Significant other Available Help at Discharge: Family;Available 24 hours/day Vocation: Retired  Programmer, systems Overall Cognitive Status: Within Functional Limits for tasks assessed Arousal/Alertness: Awake/alert Orientation Level: Oriented X4 Year: 2023 Month: March Day of Week: Correct  Memory: Appears intact Memory Impairment: Decreased short term memory Decreased Short Term Memory:  Verbal complex Immediate Memory Recall: Sock;Blue;Bed Memory Recall Sock: Not able to recall Memory Recall Blue: Without Cue Memory Recall Bed: Without Cue Awareness: Appears intact Problem Solving: Appears intact Safety/Judgment: Appears intact Comments: Mildly decreased knowlege of need for extenal assist.  Comprehension Auditory Comprehension Overall Auditory Comprehension: Appears within functional limits for tasks assessed Conversation: Complex Expression Expression Primary Mode of Expression: Verbal Verbal Expression Overall Verbal Expression: Appears within functional limits for tasks assessed Oral Motor Oral Motor/Sensory Function Overall Oral Motor/Sensory Function: Within functional limits Motor Speech Overall Motor Speech: Appears within functional limits for tasks assessed Respiration: Within functional limits Phonation: Normal Resonance: Within functional limits Articulation: Within functional limitis Intelligibility: Intelligible Motor Planning: Witnin functional limits Motor Speech Errors: Not applicable  Care Tool Care Tool Cognition Ability to hear (with hearing aid or hearing appliances if normally used Ability to hear (with hearing aid or hearing appliances if normally used): 0. Adequate - no difficulty in normal conservation, social interaction, listening to TV   Expression of Ideas and Wants Expression of Ideas and Wants: 4. Without difficulty (complex and basic) - expresses complex messages without difficulty and with speech that is clear and easy to understand   Understanding Verbal and Non-Verbal Content Understanding Verbal and Non-Verbal Content: 4. Understands (complex and basic) - clear comprehension without cues or repetitions  Memory/Recall Ability Memory/Recall Ability : That he or she is in a hospital/hospital unit;Current season;Staff names and faces   PMSV Assessment  PMSV Trial Intelligibility: Intelligible  Bedside Swallowing  Assessment General Date of Onset: 11/28/21 Previous Swallow Assessment: 2/27 nectar thick liquids and regular textures Diet Prior to this Study: Regular;Thin liquids Respiratory Status: Room air History of Recent Intubation: Yes Length of Intubations (days): 11 days Date extubated: 12/10/21 Behavior/Cognition: Alert;Pleasant mood;Cooperative Oral Cavity - Dentition: Adequate natural dentition Self-Feeding Abilities: Able to feed self Vision: Functional for self-feeding Patient Positioning: Upright in chair/Tumbleform Baseline Vocal Quality: Hoarse Volitional Cough: Strong Volitional Swallow: Able to elicit  Oral Care Assessment Does patient have any of the following "high(er) risk" factors?: None of the above Does patient have any of the following "at risk" factors?: None of the above Patient is HIGH RISK: Non-ventilated: Order set for Adult Oral Care Protocol initiated - "High Risk Patients - Non-Ventilated" option selected  (see row information) Patient is AT RISK: Order set for Adult Oral Care Protocol initiated -  "At Risk Patients" option selected (see row information) Patient is LOW RISK: Follow universal precautions (see row information) Ice Chips Ice chips: Not tested Thin Liquid Thin Liquid: Within functional limits Presentation: Cup;Straw Nectar Thick Nectar Thick Liquid: Not tested Honey Thick Honey Thick Liquid: Not tested Puree Puree: Not tested Solid Solid: Within functional limits Presentation: Self Fed BSE Assessment Risk for Aspiration Impact on safety and function: No limitations;Mild aspiration risk Other Related Risk Factors: Prolonged intubation;History of pneumonia  Short Term Goals: No short term goals set  Refer to Care Plan for Long Term Goals  Recommendations for other services: None   Discharge Criteria: Patient will be discharged from SLP if patient refuses treatment 3 consecutive times without medical reason, if treatment goals not met, if  there is a change in medical status, if patient makes no progress towards goals or if patient is discharged from hospital.  The above assessment, treatment plan, treatment alternatives and goals were discussed and mutually agreed upon: by patient and by family  Aviv Lengacher  [place] 12/19/2021, 12:41 PM

## 2021-12-19 NOTE — Progress Notes (Signed)
Inpatient Rehabilitation Center ?Individual Statement of Services ? ?Patient Name:  Gina Cherry  ?Date:  12/19/2021 ? ?Welcome to the Inpatient Rehabilitation Center.  Our goal is to provide you with an individualized program based on your diagnosis and situation, designed to meet your specific needs.  With this comprehensive rehabilitation program, you will be expected to participate in at least 3 hours of rehabilitation therapies Monday-Friday, with modified therapy programming on the weekends. ? ?Your rehabilitation program will include the following services:  Physical Therapy (PT), Occupational Therapy (OT), Speech Therapy (ST), 24 hour per day rehabilitation nursing, Therapeutic Recreaction (TR), Neuropsychology, Care Coordinator, Rehabilitation Medicine, Nutrition Services, Pharmacy Services, and Other ? ?Weekly team conferences will be held on Wednesdays to discuss your progress.  Your Inpatient Rehabilitation Care Coordinator will talk with you frequently to get your input and to update you on team discussions.  Team conferences with you and your family in attendance may also be held. ? ?Expected length of stay:  5 Days  Overall anticipated outcome:  MOD I ? ?Depending on your progress and recovery, your program may change. Your Inpatient Rehabilitation Care Coordinator will coordinate services and will keep you informed of any changes. Your Inpatient Rehabilitation Care Coordinator's name and contact numbers are listed  below. ? ?The following services may also be recommended but are not provided by the Inpatient Rehabilitation Center:  ? ?Home Health Rehabiltiation Services ?Outpatient Rehabilitation Services ? ?  ?Arrangements will be made to provide these services after discharge if needed.  Arrangements include referral to agencies that provide these services. ? ?Your insurance has been verified to be:   [employer] ?Your primary doctor is:   Annamaria Helling, MD ? ?Pertinent information will be  shared with your doctor and your insurance company. ? ?Inpatient Rehabilitation Care Coordinator:  Lavera Guise, [place] [phone] or [phone] ? ?Information discussed with and copy given to patient by: Andria Rhein, 12/19/2021, 12:19 PM    ?

## 2021-12-19 NOTE — Progress Notes (Signed)
Inpatient Rehabilitation  Patient information reviewed and entered into eRehab system by Zineb Glade M. Raeford Brandenburg, M.A., CCC/SLP, PPS Coordinator.  Information including medical coding, functional ability and quality indicators will be reviewed and updated through discharge.    

## 2021-12-19 NOTE — Evaluation (Signed)
Occupational Therapy Assessment and Plan  Patient Details  Name: Gina Cherry MRN: [id number] Date of Birth: 09-Jun-1956  OT Diagnosis: muscle weakness (generalized) Rehab Potential: Rehab Potential (ACUTE ONLY): Good ELOS: 5-7 days   Today's Date: 12/19/2021 OT Individual Time: 3382-5053 OT Individual Time Calculation (min): 71 min     Hospital Problem: Principal Problem:   Debility   Past Medical History:  Past Medical History:  Diagnosis Date   COPD (chronic obstructive pulmonary disease) ([place])    Osteoporosis    Underweight    Past Surgical History: No past surgical history on file.  Assessment & Plan Clinical Impression: Gina Cherry is a 66 year old female with history of COPD w/ tobacco use,  osteoporosis who was admitted on 11/28/21 with reports of multiple falls, SOB w/cough, generalized weakness and diarrhea. She was noted to have bilateral CAP with leukocytosis and elevated LFTs with hypokalemia.  She was started on broad-spectrum antibiotics but developed ARDS due to septic shock and was intubated and required proning due to worsening of ARDS with face discoloration.  She was found to have Legionella PNA and antibiotics narrowed to levofloxacin.  She developed acute systolic heart failure from Takotsubo cardiomyopathy with EF 30 to 35% in setting of acute sepsis.    Dr. Haroldine Laws consulted to manage fluid overload and repeat echo 2/20 showed improvement in EF to 65-70%.  Elevated trops felt to be due to demand ischemia and question of After recovery.  Metoprolol was added to manage sinus tachycardia.  She tolerated extubation and was weaned off pressors.  MBS on 02/27 and she was started on regular diet with nectar liquids was advanced to thin liquids  as swallow function improved.  Respiratory status improving and she has been weaned down to 2 L per [facility] with activity.  She continues to be limited by weakness and balance deficits due to recent illness.  CIR was  recommended due to functional decline. Patient transferred to CIR on 12/18/2021 .    Patient currently requires  CGA  with basic self-care skills and IADL secondary to muscle weakness, decreased cardiorespiratoy endurance, and decreased standing balance and decreased balance strategies.  Prior to hospitalization, patient could complete ADLs/IADLs with independence.  Patient will benefit from skilled intervention to decrease level of assist with basic self-care skills, increase independence with basic self-care skills, and increase level of independence with iADL prior to discharge home with care partner.  Anticipate patient will require intermittent supervision and follow up home health.  OT - End of Session Activity Tolerance: Tolerates 30+ min activity with multiple rests Endurance Deficit: Yes Endurance Deficit Description: Decreased activity tolerance. Poor activity pacing. OT Assessment Rehab Potential (ACUTE ONLY): Good OT Patient demonstrates impairments in the following area(s): Balance;Endurance;Safety OT Basic ADL's Functional Problem(s): Grooming;Bathing;Dressing;Toileting OT Advanced ADL's Functional Problem(s): Simple Meal Preparation OT Transfers Functional Problem(s): Toilet;Tub/Shower OT Additional Impairment(s): None OT Plan OT Intensity: Minimum of 1-2 x/day, 45 to 90 minutes OT Frequency: 5 out of 7 days OT Duration/Estimated Length of Stay: 5-7 days OT Treatment/Interventions: Balance/vestibular training;Community reintegration;Discharge planning;DME/adaptive equipment instruction;Functional mobility training;Patient/family education;Self Care/advanced ADL retraining;Therapeutic Activities;Therapeutic Exercise;UE/LE Strength taining/ROM OT Self Feeding Anticipated Outcome(s): N/A OT Basic Self-Care Anticipated Outcome(s): Mod I OT Toileting Anticipated Outcome(s): Mod I OT Bathroom Transfers Anticipated Outcome(s): Mod I OT Recommendation Patient destination: Home Follow  Up Recommendations: None Equipment Recommended: To be determined   OT Evaluation Precautions/Restrictions  Precautions Precautions: None Precaution Comments: watch SpO2 Restrictions Weight Bearing Restrictions: No General Chart  Reviewed: Yes Additional Pertinent History: PMHx significant for COPD, tobacco use and osteoporosis Family/Caregiver Present: Yes Willeen Cass) Vital Signs   Pain Pain Assessment Pain Score: 0-No pain Home Living/Prior Functioning Home Living Family/patient expects to be discharged to:: Private residence Living Arrangements: Spouse/significant other Available Help at Discharge: Family, Available 24 hours/day Type of Home: House Home Access: Stairs to enter [employer] of Steps: 3 Entrance Stairs-Rails: None Home Layout: One level Bathroom Shower/Tub: Public librarian, Door [employer]: Standard  Lives With: Significant other Prior Function Level of Independence: Independent with basic ADLs, Independent with homemaking with ambulation Vision Baseline Vision/History: 1 Wears glasses Patient Visual Report: No change from baseline Perception  Perception: Within Functional Limits Praxis Praxis: Intact Cognition Overall Cognitive Status: Within Functional Limits for tasks assessed Arousal/Alertness: Awake/alert Orientation Level: Person;Place;Situation Person: Oriented Place: Oriented Situation: Oriented Year: 2023 Month: March Day of Week: Correct Memory: Impaired Memory Impairment: Decreased short term memory Decreased Short Term Memory: Verbal complex Immediate Memory Recall: Sock;Blue;Bed Memory Recall Sock: Not able to recall Memory Recall Blue: Without Cue Memory Recall Bed: Without Cue Safety/Judgment: Impaired Comments: Mildly decreased knowlege of need for extenal assist. Sensation Sensation Light Touch: Appears Intact Coordination Gross Motor Movements are Fluid and Coordinated: Yes Fine Motor Movements are  Fluid and Coordinated: Yes Finger Nose Finger Test: Intact Motor  Motor Motor: Within Functional Limits  Trunk/Postural Assessment  Cervical Assessment Cervical Assessment: Within Functional Limits Thoracic Assessment Thoracic Assessment: Within Functional Limits Lumbar Assessment Lumbar Assessment: Within Functional Limits Postural Control Postural Control: Deficits on evaluation (Delayed, insufficient)  Balance Balance Balance Assessed: Yes Static Sitting Balance Static Sitting - Balance Support: No upper extremity supported;Feet supported Static Sitting - Level of Assistance: 7: Independent Dynamic Sitting Balance Dynamic Sitting - Balance Support: No upper extremity supported;Feet supported Dynamic Sitting - Level of Assistance: 7: Independent Static Standing Balance Static Standing - Balance Support: No upper extremity supported Static Standing - Level of Assistance: 5: Stand by assistance (CGA) Dynamic Standing Balance Dynamic Standing - Balance Support: No upper extremity supported Dynamic Standing - Level of Assistance: 5: Stand by assistance (CGA) Extremity/Trunk Assessment RUE Assessment RUE Assessment: Within Functional Limits General Strength Comments: MMT grossly 4+/5 LUE Assessment LUE Assessment: Within Functional Limits General Strength Comments: MMT grossly 4+/5  Care Tool Care Tool Self Care Eating   Eating Assist Level: Independent    Oral Care    Oral Care Assist Level: Contact Guard/Toucning assist    Bathing   Body parts bathed by patient: Right arm;Left arm;Chest;Abdomen;Front perineal area;Buttocks;Right upper leg;Left upper leg;Right lower leg;Left lower leg;Face     Assist Level: Contact Guard/Touching assist    Upper Body Dressing(including orthotics)   What is the patient wearing?: Pull over shirt   Assist Level: Set up assist    Lower Body Dressing (excluding footwear)   What is the patient wearing?: Pants;Underwear/pull up Assist  for lower body dressing: Contact Guard/Touching assist    Putting on/Taking off footwear   What is the patient wearing?: Shoes Assist for footwear: Set up assist       Care Tool Toileting Toileting activity   Assist for toileting: Contact Guard/Touching assist     Care Tool Bed Mobility Roll left and right activity   Roll left and right assist level: Independent    Sit to lying activity   Sit to lying assist level: Independent    Lying to sitting on side of bed activity   Lying to sitting on side of bed  assist level: the ability to move from lying on the back to sitting on the side of the bed with no back support.: Independent     Care Tool Transfers Sit to stand transfer   Sit to stand assist level: Contact Guard/Touching assist    Chair/bed transfer   Chair/bed transfer assist level: Contact Guard/Touching assist     Toilet transfer   Assist Level: Contact Guard/Touching assist     Care Tool Cognition  Expression of Ideas and Wants Expression of Ideas and Wants: 4. Without difficulty (complex and basic) - expresses complex messages without difficulty and with speech that is clear and easy to understand  Understanding Verbal and Non-Verbal Content Understanding Verbal and Non-Verbal Content: 4. Understands (complex and basic) - clear comprehension without cues or repetitions   Memory/Recall Ability Memory/Recall Ability : That he or she is in a hospital/hospital unit   Refer to Care Plan for Long Term Goals  SHORT TERM GOAL WEEK 1 OT Short Term Goal 1 (Week 1): STG=LTG 2/2 ELOS  Recommendations for other services: Other: TBD    Skilled Therapeutic Intervention Patient met lying supine in bed in agreement with OT treatment session. 0/10 pain reported at rest and with activity. SpO2 94% upon entry and 93% after a.m.ADLs. Education provided on purpose of rehab, role of OT, ELOS and goals. Patient expressed verbal understanding. States that her goal is to return home "as  soon as possible". Patient completed toileting, bathing/dressing at shower level and grooming tasks in standing at sink level with CGA overall. HHA for mobility short-distances in hospital room. Patient reaching out for surfaces to steady herself. Patient with deficits listed above including decreased activity tolerance/pacing, decreased dynamic standing balance and decreased cardiopulmonary endurance and would benefit from continued OT services to maximize safety/independence in prep for safe d/c home. Session concluded with patient seated in recliner with call bell within reach, chair alarm activated and all needs met.   ADL ADL Eating: Independent Grooming: Contact guard Where Assessed-Grooming: Standing at sink Upper Body Bathing: Supervision/safety Where Assessed-Upper Body Bathing: Shower Lower Body Bathing: Contact guard Where Assessed-Lower Body Bathing: Shower Upper Body Dressing: Setup Where Assessed-Upper Body Dressing: Edge of bed Lower Body Dressing: Contact guard Where Assessed-Lower Body Dressing: Edge of bed Toileting: Contact guard Where Assessed-Toileting: Glass blower/designer: Lawyer: Geophysical data processor: Curator Method: Ambulating ADL Comments: Cues for activity pacing and HHA Mobility  Bed Mobility Bed Mobility: Rolling Right;Rolling Left;Supine to Sit Rolling Right: Independent Rolling Left: Independent Supine to Sit: Independent Transfers Sit to Stand: Contact Guard/Touching assist Stand to Sit: Contact Guard/Touching assist   Discharge Criteria: Patient will be discharged from OT if patient refuses treatment 3 consecutive times without medical reason, if treatment goals not met, if there is a change in medical status, if patient makes no progress towards goals or if patient is discharged from hospital.  The above assessment, treatment plan, treatment alternatives and goals  were discussed and mutually agreed upon: by patient  Duff Pozzi R [place] 12/19/2021, 12:36 PM

## 2021-12-20 ENCOUNTER — Other Ambulatory Visit: Payer: Self-pay

## 2021-12-20 ENCOUNTER — Encounter (HOSPITAL_COMMUNITY): Payer: Self-pay | Admitting: Physical Medicine and Rehabilitation

## 2021-12-20 ENCOUNTER — Other Ambulatory Visit (HOSPITAL_COMMUNITY): Payer: Self-pay

## 2021-12-20 DIAGNOSIS — R739 Hyperglycemia, unspecified: Secondary | ICD-10-CM

## 2021-12-20 DIAGNOSIS — E876 Hypokalemia: Secondary | ICD-10-CM

## 2021-12-20 LAB — GLUCOSE, CAPILLARY
Glucose-Capillary: 84 mg/dL (ref 70–99)
Glucose-Capillary: 65 mg/dL — ABNORMAL LOW (ref 70–99)
Glucose-Capillary: 137 mg/dL — ABNORMAL HIGH (ref 70–99)
Glucose-Capillary: 65 mg/dL — ABNORMAL LOW (ref 70–99)
Glucose-Capillary: 124 mg/dL — ABNORMAL HIGH (ref 70–99)
Glucose-Capillary: 150 mg/dL — ABNORMAL HIGH (ref 70–99)

## 2021-12-20 MED ORDER — ALBUTEROL SULFATE HFA 108 (90 BASE) MCG/ACT IN AERS
2.0000 | INHALATION_SPRAY | Freq: Four times a day (QID) | RESPIRATORY_TRACT | 2 refills | Status: DC | PRN
  Filled 2021-12-20: qty 8.5, 25d supply, fill #0

## 2021-12-20 MED ORDER — FLUTICASONE FUROATE-VILANTEROL 200-25 MCG/ACT IN AEPB
1.0000 | INHALATION_SPRAY | Freq: Every day | RESPIRATORY_TRACT | 0 refills | Status: DC
  Filled 2021-12-20: qty 60, 30d supply, fill #0

## 2021-12-20 MED ORDER — ADULT MULTIVITAMIN W/MINERALS CH: 1.0000 | ORAL_TABLET | Freq: Every day | ORAL | Status: DC

## 2021-12-20 MED ORDER — FLUTICASONE FUROATE-VILANTEROL 200-25 MCG/ACT IN AEPB: 1.0000 | INHALATION_SPRAY | Freq: Every day | RESPIRATORY_TRACT | 0 refills | Status: DC

## 2021-12-20 MED ORDER — ASPIRIN 81 MG PO CHEW: 81.0000 mg | CHEWABLE_TABLET | Freq: Every day | ORAL | 0 refills | Status: DC

## 2021-12-20 MED ORDER — INSULIN ASPART 100 UNIT/ML IJ SOLN: 0.0000 [IU] | Freq: Three times a day (TID) | INTRAMUSCULAR | Status: DC

## 2021-12-20 MED ORDER — ALBUTEROL SULFATE HFA 108 (90 BASE) MCG/ACT IN AERS: 2.0000 | INHALATION_SPRAY | Freq: Four times a day (QID) | RESPIRATORY_TRACT | 2 refills | Status: DC | PRN

## 2021-12-20 MED ORDER — UMECLIDINIUM BROMIDE 62.5 MCG/ACT IN AEPB: 1.0000 | INHALATION_SPRAY | Freq: Every day | RESPIRATORY_TRACT | 1 refills | Status: DC

## 2021-12-20 MED ORDER — SPIRONOLACTONE 25 MG PO TABS
12.5000 mg | ORAL_TABLET | Freq: Every day | ORAL | 0 refills | Status: DC
  Filled 2021-12-20: qty 15, 30d supply, fill #0

## 2021-12-20 MED ORDER — UMECLIDINIUM BROMIDE 62.5 MCG/ACT IN AEPB
1.0000 | INHALATION_SPRAY | Freq: Every day | RESPIRATORY_TRACT | 1 refills | Status: DC
Start: 2021-12-20 — End: 2021-12-20
  Filled 2021-12-20: qty 30, 30d supply, fill #0

## 2021-12-20 MED ORDER — NICOTINE 21 MG/24HR TD PT24
21.0000 mg | MEDICATED_PATCH | Freq: Every day | TRANSDERMAL | 0 refills | Status: DC
Start: 2021-12-20 — End: 2022-06-23

## 2021-12-20 MED ORDER — SPIRONOLACTONE 25 MG PO TABS: 12.5000 mg | ORAL_TABLET | Freq: Every day | ORAL | 0 refills | Status: DC

## 2021-12-20 MED ORDER — METOPROLOL TARTRATE 25 MG PO TABS: 25.0000 mg | ORAL_TABLET | Freq: Two times a day (BID) | ORAL | 0 refills | Status: DC

## 2021-12-20 MED ORDER — NICOTINE 21 MG/24HR TD PT24
21.0000 mg | MEDICATED_PATCH | Freq: Every day | TRANSDERMAL | 0 refills | Status: DC
Start: 2021-12-20 — End: 2021-12-20
  Filled 2021-12-20: qty 28, 28d supply, fill #0

## 2021-12-20 MED ORDER — METOPROLOL TARTRATE 25 MG PO TABS
25.0000 mg | ORAL_TABLET | Freq: Two times a day (BID) | ORAL | 0 refills | Status: DC
  Filled 2021-12-20: qty 60, 30d supply, fill #0

## 2021-12-20 MED ORDER — ASPIRIN 81 MG PO CHEW
81.0000 mg | CHEWABLE_TABLET | Freq: Every day | ORAL | 0 refills | Status: DC
  Filled 2021-12-20: qty 30, 30d supply, fill #0

## 2021-12-20 MED ORDER — INSULIN ASPART 100 UNIT/ML IJ SOLN: 0.0000 [IU] | Freq: Every day | INTRAMUSCULAR | Status: DC

## 2021-12-20 NOTE — Progress Notes (Signed)
Patient had a hypoglycemic event at dinner time. Cipriano Mile NP notified, give her some snacks and juice and recheck per protocol. Patient has no S/S of hypoglycemia. Snacks provided for patient to have throughout the evening. Call light and personal items within reach.   ?

## 2021-12-20 NOTE — Progress Notes (Signed)
Patient ID: Gina Cherry, female   DOB: 04-28-1956, 66 y.o.   MRN: [id number] ? ?Referral sent to OP at [facility] ?

## 2021-12-20 NOTE — Patient Care Conference (Cosign Needed)
Inpatient RehabilitationTeam Conference and Plan of Care Update ?Date: 12/21/2021   Time: 12:42 PM  ? ? ?Patient Name: Gina Cherry      ?Medical Record Number: [id number]  ?Date of Birth: [birth year] ?Sex: Female         ?Room/Bed: 4W11C/4W11C-01 ?Payor Info: Payor: [employer] MEDICARE / Plan: AET[employer] MEDICARE HMO/PPO / Product Type: *No Product type* /   ? ?Admit Date/Time:  12/18/2021 12:52 PM ? ?Primary Diagnosis:  Debility ? ?Hospital Problems: Principal Problem: ?  Debility ?Active Problems: ?  Elevated LFTs ?  Tobacco abuse ?  Risk for falls ?  Malnutrition of moderate degree ?  Hyperglycemia ?  Hypokalemia ? ? ? ?Expected Discharge Date: Expected Discharge Date: 12/21/21 ? ?Team Members Present: ?Physician leading conference: Dr. Sula Soda ?Social Worker Present: Lavera Guise, BSW ?Nurse Present: Chana Bode, RN ?PT Present: Raechel Chute, PT ?OT Present: Other (comment) [facility] Cheyenne Adas, OT) ? ?   Current Status/Progress Goal Weekly Team Focus  ?Bowel/Bladder ? ? Continent of bowel and bladder: LBM 12/20/21  Remain contient of bowel and bladder with minimal assistance  Assess bowel and bladder needs q shift and PRN.   ?Swallow/Nutrition/ Hydration ? ?           ?ADL's ? ? Supervision to mod I for all self-care and transfers without AD  mod I  d/c planning, DME education, to d/c at mod I level with no f/u OT   ?Mobility ? ? CGA transfers without AD, gait 749ft without AD CGA, 12 steps 2 rails CGA  Mod I  functional mobility, generalized strengthening and endurnace, dynamic standing balance/coordination, D/C planning, family education training.   ?Communication ? ?           ?Safety/Cognition/ Behavioral Observations ?           ?Pain ? ? Denies pain. Rates pain 0/10.  Rates pain less than or equal to 2/10.  Assess pain q shift and PRN.   ?Skin ? ? Left lower extremity skin tear.Scattered bruising. MASD to buttocks.  Remain free from skin breakdown and infection.  Assess skin q shift and PRN.    ? ? ?Discharge Planning:  ?Discharging home tomorrow with OP therapy.   ?Team Discussion: ?Patient is anxious to go home.  ?Patient on target to meet rehab goals: ?yes, doing well and on target for discharge. ? ?*See Care Plan and progress notes for long and short-term goals.  ? ?Revisions to Treatment Plan:  ?N/A  ?Teaching Needs: ?Safety, transfers, toileting, medications, etc  ?Current Barriers to Discharge: ?Decreased caregiver support ? ?Possible Resolutions to Barriers: ?Family education ?Follow up services ?DME: RW, BSC ?  ? ? Medical Summary ?Current Status: doing great with mobiity! discussed that she can d/c tomorrow, CBGs much improved, slightly elevated at times ? Barriers to Discharge: Medical stability ? Barriers to Discharge Comments: slight elevations in CBGs, hypoalbuminemia, underweight, polypharmacy ?Possible Resolutions to [employer]: weaned off insulin and provided dietary counseling, enoxaparin discontinued given excellent ambulation ? ? ?Continued Need for Acute Rehabilitation Level of Care: The patient requires daily medical management by a physician with specialized training in physical medicine and rehabilitation for the following reasons: ?Direction of a multidisciplinary physical rehabilitation program to maximize functional independence : Yes ?Medical management of patient stability for increased activity during participation in an intensive rehabilitation regime.: Yes ?Analysis of laboratory values and/or radiology reports with any subsequent need for medication adjustment and/or medical intervention. : Yes ? ? ?I attest that I  was present, lead the team conference, and concur with the assessment and plan of the team. ? ? ?Chana Bode B ?12/21/2021, 12:42 PM  ? ? ? ? ? ? ?

## 2021-12-20 NOTE — Progress Notes (Signed)
Inpatient Rehabilitation Care Coordinator ?Discharge Note  ? ?Patient Details  ?Name: Gina Cherry ?MRN: [id number] ?Date of Birth: 22-Apr-1956 ? ? ?Discharge location: Home ? ?Length of Stay: 3 Days ? ?Discharge activity level: Mod I ? ?Home/community participation: Spouse (24/7) ? ?Patient response IE:PPIRJJ Literacy - How often do you need to have someone help you when you read instructions, pamphlets, or other written material from your doctor or pharmacy?: Rarely ? ?Patient response OA:CZYSAY Isolation - How often do you feel lonely or isolated from those around you?: Never ? ?Services provided included: SW, Pharmacy, TR, CM, RN, SLP, OT, PT, RD, MD ? ?Financial Services:  ?Field seismologist Utilized: Private Insurance ?[employer] Medicare ? ?Choices offered to/list presented to: patient ? ?Follow-up services arranged:  ?Outpatient ?   ?Outpatient Servicies: OP at [facility] ?  ?  ? ?Patient response to transportation need: ?Is the patient able to respond to transportation needs?: Yes ?In the past 12 months, has lack of transportation kept you from medical appointments or from getting medications?: No ?In the past 12 months, has lack of transportation kept you from meetings, work, or from getting things needed for daily living?: No ? ? ? ?Comments (or additional information): ? ?Patient/Family verbalized understanding of follow-up arrangements:  Yes ? ?Individual responsible for coordination of the follow-up plan: self or  Kathlene November [phone] ? ?Confirmed correct DME delivered: Andria Rhein 12/20/2021   ? ?Andria Rhein ?

## 2021-12-20 NOTE — Progress Notes (Signed)
Hypoglycemic Event ? ?CBG: 65 ?Treatment: oz juice, peanut butter  ? ?Symptoms: None ? ?Follow-up CBG: Time:1215 CBG Result:136 ?Possible Reasons for Event: Change in activity ? ?Comments/MD notified:Dr Raulkar ? ? ? ?Gina Cherry ? ? ?

## 2021-12-20 NOTE — Progress Notes (Signed)
Physical Therapy Discharge Summary ? ?Patient Details  ?Name: Gina Cherry ?MRN: [id number] ?Date of Birth: 1956/06/09 ? ?Patient has met 5 of 9 long term goals due to improved activity tolerance, improved balance, increased strength, and improved coordination.  Patient to discharge at an ambulatory level Supervision, without AD. Patient's care partner is independent to provide the necessary physical assistance at discharge. Pt's husband was cleared to assist with toileting during CIR stay and participated in family education training with PT on 3/7.  ? ?Reasons goals not met: Pt did not meet all goals as pt requested to discharge ASAP and per pt/husband, husband will be available to provide 24/7 assist upon D/C. Pt is able to perform transfers without AD and mod I but continues to require supervision for gait and stair navigation due to generalized weakness/deconditioning and decreased balance/postural control. ? ?Recommendation:  ?Patient will benefit from ongoing skilled PT services in outpatient setting to continue to advance safe functional mobility, address ongoing impairments in generalized strengthening and endurance, dynamic standing balance/coordination, gait training, and to minimize fall risk. ? ?Equipment: ?RW ? ?Reasons for discharge: treatment goals met and discharge from hospital ? ?Patient/family agrees with progress made and goals achieved: Yes ? ?PT Discharge ?Precautions/Restrictions ?Precautions ?Precautions: None ?Precaution Comments: watch SpO2 ?Restrictions ?Weight Bearing Restrictions: No ?Other Position/Activity Restrictions: watch sats and HR ?Pain Interference ?Pain Interference ?Pain Effect on Sleep: 1. Rarely or not at all ?Pain Interference with Therapy Activities: 1. Rarely or not at all ?Pain Interference with Day-to-Day Activities: 1. Rarely or not at all ?Cognition ?Overall Cognitive Status: Within Functional Limits for tasks assessed ?Arousal/Alertness:  Awake/alert ?Orientation Level: Oriented X4 ?Memory: Appears intact ?Awareness: Appears intact ?Problem Solving: Appears intact ?Safety/Judgment: Appears intact ?Sensation ?Sensation ?Light Touch: Appears Intact ?Proprioception: Appears Intact ?Coordination ?Gross Motor Movements are Fluid and Coordinated: Yes ?Fine Motor Movements are Fluid and Coordinated: Yes ?Finger Nose Finger Test: Intact ?Motor  ?Motor ?Motor: Within Functional Limits ?Motor - Skilled Clinical Observations: generalized weakness/deconditioning  ?Mobility ?Bed Mobility ?Bed Mobility: Rolling Right;Rolling Left;Sit to Supine;Supine to Sit ?Rolling Right: Independent ?Rolling Left: Independent ?Supine to Sit: Independent ?Sit to Supine: Independent ?Transfers ?Transfers: Sit to Stand;Stand to [employer] Transfers ?Sit to Stand: Independent with assistive device ?Stand to Sit: Independent with assistive device ?Stand Pivot Transfers: Independent with assistive device ?Transfer (Assistive device): None ?Locomotion  ?Gait ?Ambulation: Yes ?Gait Assistance: Supervision/Verbal cueing ?Gait Distance (Feet): 150 Feet ?Assistive device: None ?Gait Assistance Details: Verbal cues for gait pattern ?Gait Assistance Details: verbal cues to decrease wide BOS ?Gait ?Gait: Yes ?Gait Pattern: Impaired ?Gait Pattern: Wide base of support;Decreased stride length;Poor foot clearance - left;Poor foot clearance - right ?Gait velocity: slightly decreased ?Stairs / Additional Locomotion ?Stairs: Yes ?Stairs Assistance: Supervision/Verbal cueing ?Stair Management Technique: One rail Left ?Number of Stairs: 12 ?Height of Stairs: 6 ?Ramp: Contact Guard/touching assist ?Curb: Contact Guard/Touching assist ?Wheelchair Mobility ?Wheelchair Mobility: No  ?Trunk/Postural Assessment  ?Cervical Assessment ?Cervical Assessment: Within Functional Limits ?Thoracic Assessment ?Thoracic Assessment: Within Functional Limits ?Lumbar Assessment ?Lumbar Assessment: Within Functional  Limits ?Postural Control ?Postural Control: Within Functional Limits  ?Balance ?Balance ?Balance Assessed: Yes ?Standardized Balance Assessment ?Standardized Balance Assessment: Berg Balance Test ?Merrilee Jansky Balance Test ?Sit to Stand: Able to stand without using hands and stabilize independently ?Standing Unsupported: Able to stand 2 minutes with supervision ?Sitting with Back Unsupported but Feet Supported on Floor or Stool: Able to sit safely and securely 2 minutes ?Stand to Sit: Controls descent by using hands ?Transfers:  Able to transfer with verbal cueing and /or supervision ?Standing Unsupported with Eyes Closed: Able to stand 10 seconds with supervision ?Standing Ubsupported with Feet Together: Able to place feet together independently and stand for 1 minute with supervision ?From Standing, Reach Forward with Outstretched Arm: Reaches forward but needs supervision ?From Standing Position, Pick up Object from Floor: Able to pick up shoe, needs supervision ?From Standing Position, Turn to Look Behind Over each Shoulder: Looks behind from both sides and weight shifts well ?Turn 360 Degrees: Able to turn 360 degrees safely but slowly ?Standing Unsupported, Alternately Place Feet on Step/Stool: Able to stand independently and complete 8 steps >20 seconds ?Standing Unsupported, One Foot in Front: Able to plae foot ahead of the other independently and hold 30 seconds ?Standing on One Leg: Tries to lift leg/unable to hold 3 seconds but remains standing independently ?Total Score: 39 ?Static Sitting Balance ?Static Sitting - Balance Support: No upper extremity supported;Feet supported ?Static Sitting - Level of Assistance: 7: Independent ?Dynamic Sitting Balance ?Dynamic Sitting - Balance Support: No upper extremity supported;Feet supported ?Dynamic Sitting - Level of Assistance: 7: Independent ?Static Standing Balance ?Static Standing - Balance Support: No upper extremity supported ?Static Standing - Level of Assistance:  6: Modified independent (Device/Increase time) ?Dynamic Standing Balance ?Dynamic Standing - Balance Support: No upper extremity supported ?Dynamic Standing - Level of Assistance: 5: Stand by assistance (supervision) ?Dynamic Standing - Comments: with gait ?Extremity Assessment  ?RLE Assessment ?RLE Assessment: Exceptions to [facility] ?General Strength Comments: Hips 4/5, Otherwise grossly WFL ?LLE Assessment ?LLE Assessment: Exceptions to [facility] ?General Strength Comments: Hips 4/5, otherwise grossly WFL ? ? ?Blenda Nicely ?Becky Sax PT, DPT  ?12/20/2021, 12:24 PM ?

## 2021-12-20 NOTE — Progress Notes (Signed)
Patient up attending therapy. Encouraged patient to have a small snack between therapy to avoid hypoglycemia.  ?

## 2021-12-20 NOTE — Progress Notes (Signed)
Physical Therapy Session Note ? ?Patient Details  ?Name: Gina Cherry ?MRN: [id number] ?Date of Birth: 05-01-1956 ? ?Today's Date: 12/20/2021 ?PT Individual Time: 1000-1054 ?PT Individual Time Calculation (min): 54 min  ? ?Short Term Goals: ?Week 1:  PT Short Term Goal 1 (Week 1): STGs = LTGs ? ?Skilled Therapeutic Interventions/Progress Updates:  ? Received pt sitting in recliner with husband lying in bed - pt's husband cleared to assist pt to bathroom per OT. Pt agreeable to PT treatment and denied any pain during session. Session with emphasis on functional mobility, toileting, generalized strengthening and endurance, dynamic standing balance/coordination, stair navigation, and gait training. Pt performed all transfers without AD and mod I throughout session with 1 LOB requiring CGA to correct. Pt ambulated in/out of bathroom without AD and CGA/supervision and performed all toileting tasks and transfers from regular height toilet with close supervision. Pt ambulated 125ft without AD and supervision to dayroom - cues to correct wide BOS. Pt performed BUE/LE strengthening on Nustep on workload 4 for 5 minutes for a total of 207 steps with emphasis on cardiovascular endurance. Pt required extensive rest break and ambulated additional 167ft x 2 trials without AD and supervision to/from main therapy gym. Pt navigated 12 steps with L handrail and supervision ascending and descending with a step through pattern to simulate home entry - O2 sat 100% after activity. Discussed equipment for D/C specifically SPC vs RW with pt requesting RW "just in case". Per pt report, her husband will be able to provide 24/7 assist upon D/C and pt requesting to D/C tomorrow - increased time spent during session communicating with team members to set up D/C for tomorrow - plan to have husband observe stair navigation this afternoon during PT session. Worked on blocked practice sit<>stands on [employer] without UE support and close supervision  for 2x15 reps with emphasis on quad strength. Pt ambulated 159ft without AD and supervision back to room - equipment delivered. Concluded session with pt sitting EOB with all needs within reach and husband present at bedside.  ? ?Therapy Documentation ?Precautions:  ?Precautions ?Precautions: None ?Precaution Comments: watch SpO2 ?Restrictions ?Weight Bearing Restrictions: No ?Other Position/Activity Restrictions: watch sats and HR ? ?Therapy/Group: Individual Therapy ?Alfonso Patten ?Raechel Chute PT, DPT  ? ?12/20/2021, 7:24 AM  ?

## 2021-12-20 NOTE — Progress Notes (Signed)
Patient ID: Gina Cherry, female   DOB: [birth year], 66 y.o.   MRN: [id number] ? ?Rolling Walker and BSC ordered through [employer]. ?

## 2021-12-20 NOTE — Progress Notes (Signed)
Inpatient Diabetes Program Recommendations ? ?AACE/ADA: New Consensus Statement on Inpatient Glycemic Control (2015) ? ?Target Ranges:  Prepandial:   less than 140 mg/dL ?     Peak postprandial:   less than 180 mg/dL (1-2 hours) ?     Critically ill patients:  140 - 180 mg/dL  ? ?Lab Results  ?Component Value Date  ? GLUCAP 65 (L) 12/20/2021  ? HGBA1C 5.5 11/28/2021  ? ? ?Review of Glycemic Control ? Latest Reference Range & Units 12/19/21 06:03 12/19/21 08:54 12/19/21 11:24 12/19/21 16:33 12/19/21 21:16 12/20/21 06:41 12/20/21 11:30  ?Glucose-Capillary 70 - 99 mg/dL 76 832 (H) 549 (H) 826 (H) 76 84 65 (L)  ? ?Inpatient Diabetes Program Recommendations:   ?-Decrease Novolog correction to 0-6 units tid, hs 0-5 units ?Secure chat sent to Dr. Carlis Abbott. ? ?Thank you, ?Billy Fischer Kenzlie Disch, RN, MSN, CDE  ?Diabetes Coordinator ?Inpatient Glycemic Control Team ?Team Pager [phone] (8am-5pm) ?12/20/2021 11:44 AM ? ? ? ? ?

## 2021-12-20 NOTE — Progress Notes (Signed)
Hypoglycemic Event ? ?CBG: 65 ? ?Treatment: 8 oz juice/soda ? ?Symptoms: None ? ?Follow-up CBG: Time:1746 ? CBG Result:124 ? ? ?Possible Reasons for Event: Other:   ? ?Comments/MD notified:Pam Love ? ? ? ?Lenor Derrick ? ? ?

## 2021-12-20 NOTE — Progress Notes (Signed)
?                                                       PROGRESS NOTE ? ? ?Subjective/Complaints: ?Stable for d/c tomorrow- discussed with patient, husband, and care team.  ?Decreased diabetes correction scale ?Discussed benefits of protein from whole foods ? ?ROS: denies pain ? ?Objective: ?  ?No results found. ?No results for input(s): WBC, HGB, HCT, PLT in the last 72 hours. ?Recent Labs  ?  12/19/21 ?0600  ?NA 139  ?K 3.2*  ?CL 106  ?CO2 26  ?GLUCOSE 86  ?BUN 6*  ?CREATININE 0.49  ?CALCIUM 8.5*  ? ? ?Intake/Output Summary (Last 24 hours) at 12/20/2021 1146 ?Last data filed at 12/19/2021 1800 ?Gross per 24 hour  ?Intake 480 ml  ?Output 1 ml  ?Net 479 ml  ?  ? ?  ? ?Physical Exam: ?Vital Signs ?Blood pressure 120/65, pulse 90, temperature 98.1 ?F (36.7 ?C), resp. rate 14, weight 45.3 kg, SpO2 98 %. ?Gen: no distress, normal appearing ?HEENT: oral mucosa pink and moist, NCAT ?Cardio: Reg rate ?Chest: normal effort, normal rate of breathing ?Abd: soft, non-distended ?Ext: no edema ?Psych: pleasant, normal affect ?Skin: ?   General: Skin is warm and dry.  ?   Comments: Small stg 1 sacrum. Skin tears bilateral lower legs  ?Neurological:  ?   General: No focal deficit present.  ?   Mental Status: She is oriented to person, place, and time.  ?   Cranial Nerves: No cranial nerve deficit.  ?   Sensory: No sensory deficit.  ?   Comments: Motor 4-5/5 UE, 4/5 LE prox to distal. No abnl tone. Normal coordination.   ?Psychiatric:     ?   Mood and Affect: Mood normal.     ?   Behavior: Behavior normal.     ?   Thought Content: Thought content normal.     ?   Judgment: Judgment normal. ? ? ?Assessment/Plan: ?1. Functional deficits which require 3+ hours per day of interdisciplinary therapy in a comprehensive inpatient rehab setting. ?Physiatrist is providing close team supervision and 24 hour management of active medical problems listed below. ?Physiatrist and rehab team continue to assess barriers to discharge/monitor patient  progress toward functional and medical goals ? ?Care Tool: ? ?Bathing ?   ?Body parts bathed by patient: Right arm, Left arm, Chest, Abdomen, Front perineal area, Buttocks, Right upper leg, Left upper leg, Right lower leg, Left lower leg, Face  ?   ?  ?  ?Bathing assist Assist Level: Contact Guard/Touching assist ?  ?  ?Upper Body Dressing/Undressing ?Upper body dressing   ?What is the patient wearing?: Pull over shirt ?   ?Upper body assist Assist Level: Set up assist ?   ?Lower Body Dressing/Undressing ?Lower body dressing ? ? ?   ?What is the patient wearing?: Pants, Underwear/pull up ? ?  ? ?Lower body assist Assist for lower body dressing: Contact Guard/Touching assist ?   ? ?Toileting ?Toileting    ?Toileting assist Assist for toileting: Contact Guard/Touching assist ?  ?  ?Transfers ?Chair/bed transfer ? ?Transfers assist ?   ? ?Chair/bed transfer assist level: Independent with assistive device ?  ?  ?Locomotion ?Ambulation ? ? ?Ambulation assist ? ?   ? ?Assist level: Supervision/Verbal cueing ?Assistive device: No  Device ?Max distance: >174ft  ? ?Walk 10 feet activity ? ? ?Assist ?   ? ?Assist level: Supervision/Verbal cueing ?Assistive device: No Device  ? ?Walk 50 feet activity ? ? ?Assist   ? ?Assist level: Supervision/Verbal cueing ?Assistive device: No Device  ? ? ?Walk 150 feet activity ? ? ?Assist   ? ?Assist level: Supervision/Verbal cueing ?Assistive device: No Device ?  ? ?Walk 10 feet on uneven surface  ?activity ? ? ?Assist   ? ? ?Assist level: Contact Guard/Touching assist ?   ? ?Wheelchair ? ? ? ? ?Assist Is the patient using a wheelchair?: No ?  ?  ? ?  ?   ? ? ?Wheelchair 50 feet with 2 turns activity ? ? ? ?Assist ? ?  ?  ? ? ?   ? ?Wheelchair 150 feet activity  ? ? ? ?Assist ?   ? ? ?   ? ?Blood pressure 120/65, pulse 90, temperature 98.1 ?F (36.7 ?C), resp. rate 14, weight 45.3 kg, SpO2 98 %. ? ?Medical Problem List and Plan: ?1. Functional deficits secondary to debility after Acute  respiratory failure d/t legionella pneumonia ?            -patient may shower ?            -ELOS/Goals: 5 days, mod I ? D/c tomorrow ?2.  Antithrombotics: ?-DVT/anticoagulation:  Pharmaceutical: Lovenox ?            -antiplatelet therapy: asa ?3. Pain: continue tylenol prn.  ?4. Mood: LCSW to follow for evaluation and support.  ?            -antipsychotic agents: N/A ?5. Neuropsych: This patient is capable of making decisions on her own behalf. ?6. Skin/Wound Care: Maintain adequate nutrition and hydration status.  ?            --PRAFO's/ pressure-relief measures for heels, lower legs, sacrum ?            -OOB ?7. Fluids/Electrolytes/Nutrition: monitor I/O. Check CMET on 03/06 ?8.  Acute systolic heart failure: initial ECHO with EF 30-45% but repeat demonstrated EF 65-70%. ?-Monitor weights daily.   ?-Low-salt diet.   ?-Monitor for signs of overload ?            --on ASA, metoprolol and low dose aldactone.  ?            -outpt cards follow up ?9.  Legionella PNA: Hypoxic respiratory resolved and has completed Levaquin for treatment. ?10.  Stress induced hyperglycemia: Hemoglobin A1c 5.5. Likely due to tube feeds.  ?-- Continue to monitor blood sugars ac/hs ?            -- d/c semglee ? -decrease correction scale ?11.  COPD: Continue Incruse and Breo. Wean oxygen as table to keep sats > 88%  ?12. Acute renal failure: avoid nephrotoxic meds. SCr ranging 3.4 to 0.41 despite fluids.  ?--recheck 03/06 . ?13. Abnormal LFTs: Likely due to alcohol use and shock-->improving. ?            -- Continue to trend for recovery. ? -discussed excellent improvements with her ?14. Hypokalemia: supplement BID today ?15. Hypoalbuminemia: encouraged high protein foods ?16. Underweight: liberalize diet to regular ? ? ?LOS: ?2 days ?A FACE TO FACE EVALUATION WAS PERFORMED ? ?Clint Bolder P Beauford Lando ?12/20/2021, 11:46 AM  ? ?  ?

## 2021-12-20 NOTE — Progress Notes (Signed)
Occupational Therapy Discharge Summary ? ?Patient Details  ?Name: Gina Cherry ?MRN: [id number] ?Date of Birth: May 13, 1956 ? ? ?Patient has met 1 of 10 long term goals due to improved activity tolerance and improved balance. At time of evaluation 3/6 patient performing ADLs with CGA overall. Goals set to Mod I without anticipation of very short length of stay. Patient to discharge at overall Supervision level with use of RW.  Patient's care partner Ronalee Belts (fiance) is independent and able to provide the necessary physical assistance at discharge. Ronalee Belts has been present since evaluation and assisted patient with toilet transfer this a.m.  ? ?Reasons goals not met: 2 day length of stay not anticipated and goals set at Mod I level. Patient currently functioning at supervision A level with use of RW.  ? ?Recommendation:  ?Patient will benefit from ongoing skilled OT services in outpatient setting to continue to advance functional skills in the area of BADL and Reduce care partner burden. ? ?Equipment: ?Rolling walker and BSC ? ?Reasons for discharge: discharge from hospital ? ?Patient/family agrees with progress made and goals achieved: Yes ? ?OT Discharge ?Precautions/Restrictions  ?Precautions ?Precautions: None ?Precaution Comments: watch SpO2 ?Restrictions ?Weight Bearing Restrictions: No ?Other Position/Activity Restrictions: watch sats and HR ?General ?  ?Vital Signs ?  ?Pain ?Pain Assessment ?Pain Scale: 0-10 ?Pain Score: 0-No pain ?ADL ?ADL ?Eating: Independent ?Grooming: Supervision/safety ?Where Assessed-Grooming: Standing at sink ?Upper Body Bathing: Independent ?Where Assessed-Upper Body Bathing: Shower ?Lower Body Bathing: Supervision/safety ?Where Assessed-Lower Body Bathing: Shower ?Upper Body Dressing: Independent ?Where Assessed-Upper Body Dressing: Edge of bed ?Lower Body Dressing: Supervision/safety ?Where Assessed-Lower Body Dressing: Edge of bed ?Toileting: Supervision/safety ?Where  Assessed-Toileting: Toilet ?Toilet Transfer: Close supervision ?Toilet Transfer Equipment: Bedside commode ?Tub/Shower Transfer: Close supervison ?Tub/Shower Transfer Method: Ambulating ?Tub/Shower Equipment: Shower seat without back ?Walk-In Shower Transfer: Contact guard ?Walk-In Shower Transfer Method: Ambulating ?ADL Comments: Cues for activity pacing and HHA ?Vision ?Baseline Vision/History: 1 Wears glasses ?Patient Visual Report: No change from baseline ?Perception  ?Perception: Within Functional Limits ?Praxis ?Praxis: Intact ?Cognition ?Overall Cognitive Status: Within Functional Limits for tasks assessed ?Arousal/Alertness: Awake/alert ?Orientation Level: Oriented X4 ?Year: 2023 ?Month: March ?Day of Week: Correct ?Memory: Appears intact ?Memory Impairment: Decreased short term memory ?Decreased Short Term Memory: Verbal complex ?Immediate Memory Recall: Sock;Blue;Bed ?Memory Recall Sock: Not able to recall ?Memory Recall Blue: Without Cue ?Memory Recall Bed: Without Cue ?Awareness: Appears intact ?Problem Solving: Appears intact ?Safety/Judgment: Appears intact ?Comments: Mildly decreased knowlege of need for extenal assist. ?Sensation ?Sensation ?Light Touch: Appears Intact ?Proprioception: Appears Intact ?Coordination ?Gross Motor Movements are Fluid and Coordinated: Yes ?Fine Motor Movements are Fluid and Coordinated: Yes ?Finger Nose Finger Test: Intact ?Motor  ?Motor ?Motor: Within Functional Limits ?Motor - Skilled Clinical Observations: generalized weakness/deconditioning ?Mobility  ?Bed Mobility ?Bed Mobility: Rolling Right;Rolling Left;Sit to Supine;Supine to Sit ?Rolling Right: Independent ?Rolling Left: Independent ?Supine to Sit: Independent ?Sit to Supine: Independent ?Transfers ?Sit to Stand: Independent with assistive device ?Stand to Sit: Independent with assistive device  ?Trunk/Postural Assessment  ?Cervical Assessment ?Cervical Assessment: Within Functional Limits ?Thoracic  Assessment ?Thoracic Assessment: Within Functional Limits ?Lumbar Assessment ?Lumbar Assessment: Within Functional Limits ?Postural Control ?Postural Control: Within Functional Limits  ?Balance ?Balance ?Balance Assessed: Yes ?Standardized Balance Assessment ?Standardized Balance Assessment: Berg Balance Test ?Merrilee Jansky Balance Test ?Sit to Stand: Able to stand without using hands and stabilize independently ?Standing Unsupported: Able to stand 2 minutes with supervision ?Sitting with Back Unsupported but Feet Supported on Floor or Stool: Able  to sit safely and securely 2 minutes ?Stand to Sit: Controls descent by using hands ?Transfers: Able to transfer with verbal cueing and /or supervision ?Standing Unsupported with Eyes Closed: Able to stand 10 seconds with supervision ?Standing Ubsupported with Feet Together: Able to place feet together independently and stand for 1 minute with supervision ?From Standing, Reach Forward with Outstretched Arm: Reaches forward but needs supervision ?From Standing Position, Pick up Object from Floor: Able to pick up shoe, needs supervision ?From Standing Position, Turn to Look Behind Over each Shoulder: Looks behind from both sides and weight shifts well ?Turn 360 Degrees: Able to turn 360 degrees safely but slowly ?Standing Unsupported, Alternately Place Feet on Step/Stool: Able to stand independently and complete 8 steps >20 seconds ?Standing Unsupported, One Foot in Front: Able to plae foot ahead of the other independently and hold 30 seconds ?Standing on One Leg: Tries to lift leg/unable to hold 3 seconds but remains standing independently ?Total Score: 39 ?Static Sitting Balance ?Static Sitting - Balance Support: No upper extremity supported;Feet supported ?Static Sitting - Level of Assistance: 7: Independent ?Dynamic Sitting Balance ?Dynamic Sitting - Balance Support: No upper extremity supported;Feet supported ?Dynamic Sitting - Level of Assistance: 7: Independent ?Static  Standing Balance ?Static Standing - Balance Support: No upper extremity supported ?Static Standing - Level of Assistance: 6: Modified independent (Device/Increase time) ?Dynamic Standing Balance ?Dynamic Standing - Balance Support: No upper extremity supported ?Dynamic Standing - Level of Assistance: 5: Stand by assistance ?Dynamic Standing - Comments: with gait ?Extremity/Trunk Assessment ?RUE Assessment ?RUE Assessment: Within Functional Limits ?General Strength Comments: MMT grossly 4+/5 ?LUE Assessment ?LUE Assessment: Within Functional Limits ?General Strength Comments: MMT grossly 4+/5 ? ? ?Lorren Rossetti R Howerton-Davis ?12/20/2021, 12:31 PM ?

## 2021-12-20 NOTE — Progress Notes (Signed)
Hypoglycemic Event ? ?CBG: 65 ? ?Treatment: 4 oz juice/soda ? ?Symptoms: None ? ?Follow-up CBG: Time:1215 CBG Result:136 ? ?Possible Reasons for Event: Change in activity ? ?Comments/MD notified: ?Dr. Carlis Abbott ? ? ? ? ?Lenor Derrick ? ? ?

## 2021-12-20 NOTE — Progress Notes (Signed)
Physical Therapy Session Note ? ?Patient Details  ?Name: Gina Cherry ?MRN: [id number] ?Date of Birth: 08-Jun-1956 ? ?Today's Date: 12/20/2021 ?PT Individual Time: 7062-3762 ?PT Individual Time Calculation (min): 28 min  ? ?Short Term Goals: ?Week 1:  PT Short Term Goal 1 (Week 1): STGs = LTGs ? ?Skilled Therapeutic Interventions/Progress Updates:  ?   ?Pt supine in bed to start. Pt's husband at bedside and pt agreeable to PT tx. Denies pain. Focused session on family education and training to prepare for upcoming DC. Pt and family deny questions and report confidence regarding upcoming DC, minimal questions. Pt reporting need to void prior to leaving her room. Bed mobility completed mod I and sit<>stand supervision no AD. Ambulated within her room with supervision and no AD to bathroom. Continent without assist for pericare. Ambulated ~137ft to ortho rehab gym with supervision and no AD - VC for pacing activity and reducing wide BOS. She navigated up/down x12 6inch steps using 1 hand rail with CGA. Husband then directed in providing CGA and pt able to complete with HHA from husband and PT providing supervision for added safety. Husband demonstrating appropriate guarding and ability to assist with patient. Ambulated to ortho rehab gym, ~139ft, with supervision and no AD. Completed car transfer at supervision level without difficulty, cueing for safety awareness. Ambulated back to her room, ~225ft, with supervision and no AD. Concluded session seated EOB, husband at bedside, no further questions or concerns. All needs met.  ? ?Therapy Documentation ?Precautions:  ?Precautions ?Precautions: None ?Precaution Comments: watch SpO2 ?Restrictions ?Weight Bearing Restrictions: No ?Other Position/Activity Restrictions: watch sats and HR ?General: ?  ? ? ?Therapy/Group: Individual Therapy ? ?Marily Konczal P Moe Brier ?12/20/2021, 3:33 PM  ?

## 2021-12-20 NOTE — Progress Notes (Signed)
Inpatient Rehabilitation Discharge Medication Review by a Pharmacist ? ?A complete drug regimen review was completed for this patient to identify any potential clinically significant medication issues. ? ?High Risk Drug Classes Is patient taking? Indication by Medication  ?Antipsychotic No    ?Anticoagulant No   ?Antibiotic No    ?Opioid No    ?Antiplatelet Yes Aspirin - ASCVD  ?Hypoglycemics/insulin No   ?Vasoactive Medication Yes Metoprolol tartrate, Spironolactone - Heart failure  ?Chemotherapy No    ?Other Yes Albuterol, Breo ellipta, Incruse ellipta - COPD  ? ? ?Type of Medication Issue Identified Description of Issue Recommendation(s)  ?Drug Interaction(s) (clinically significant) ?    ?Duplicate Therapy ?    ?Allergy ?    ?No Medication Administration End Date ?    ?Incorrect Dose ?    ?Additional Drug Therapy Needed ?    ?Significant med changes from prior encounter (inform family/care partners about these prior to discharge).    ?Other ?    ? ? ?Clinically significant medication issues were identified that warrant physician communication and completion of prescribed/recommended actions by midnight of the next day:  No ? ?Pharmacist comments: Resume alendronate eventually for osteoporosis  ? ?Time spent performing this drug regimen review (minutes):  20 minutes ? ? ?Elwin Sleight ?12/20/2021 3:45 PM ?

## 2021-12-20 NOTE — Plan of Care (Signed)
?Problem: RH Balance ?Goal: LTG Patient will maintain dynamic standing with ADLs (OT) ?Description: LTG:  Patient will maintain dynamic standing balance with assist during activities of daily living (OT)  ?12/20/2021 1245 by Howerton-Davis, Claude Swendsen R, OT ?Note: Supervision A currently; limited progress 2/2 very short length of stay ?12/20/2021 1245 by Turner Daniels R, OT ?Outcome: Not Met (add Reason) ?  ?Problem: Sit to Stand ?Goal: LTG:  Patient will perform sit to stand in prep for activites of daily living with assistance level (OT) ?Description: LTG:  Patient will perform sit to stand in prep for activites of daily living with assistance level (OT) ?12/20/2021 1245 by Howerton-Davis, Tyce Delcid R, OT ?Note: Supervision A currently; limited progress 2/2 very short length of stay ?12/20/2021 1245 by Turner Daniels R, OT ?Outcome: Not Met (add Reason) ?  ?Problem: RH Grooming ?Goal: LTG Patient will perform grooming w/assist,cues/equip (OT) ?Description: LTG: Patient will perform grooming with assist, with/without cues using equipment (OT) ?12/20/2021 1245 by Howerton-Davis, Edahi Kroening R, OT ?Note: Supervision A currently; limited progress 2/2 very short length of stay ?12/20/2021 1245 by Turner Daniels R, OT ?Outcome: Not Met (add Reason) ?  ?Problem: RH Bathing ?Goal: LTG Patient will bathe all body parts with assist levels (OT) ?Description: LTG: Patient will bathe all body parts with assist levels (OT) ?12/20/2021 1245 by Howerton-Davis, Kendrea Cerritos R, OT ?Note: Supervision A currently; limited progress 2/2 very short length of stay ?12/20/2021 1245 by Turner Daniels R, OT ?Outcome: Not Met (add Reason) ?  ?Problem: RH Dressing ?Goal: LTG Patient will perform lower body dressing w/assist (OT) ?Description: LTG: Patient will perform lower body dressing with assist, with/without cues in positioning using equipment (OT) ?12/20/2021 1245 by Howerton-Davis, Azam Gervasi R, OT ?Note: Supervision A  currently; limited progress 2/2 very short length of stay ?12/20/2021 1245 by Turner Daniels R, OT ?Outcome: Not Met (add Reason) ?  ?Problem: RH Toileting ?Goal: LTG Patient will perform toileting task (3/3 steps) with assistance level (OT) ?Description: LTG: Patient will perform toileting task (3/3 steps) with assistance level (OT)  ?12/20/2021 1245 by Howerton-Davis, Ketina Mars R, OT ?Note: Supervision A currently; limited progress 2/2 very short length of stay ?12/20/2021 1245 by Turner Daniels R, OT ?Outcome: Not Met (add Reason) ?  ?Problem: RH Light Housekeeping ?Goal: LTG Patient will perform light housekeeping w/assist (OT) ?Description: LTG: Patient will perform light housekeeping with assistance, with/without cues (OT). ?12/20/2021 1245 by Howerton-Davis, Avarae Zwart R, OT ?Note: Supervision A currently; limited progress 2/2 very short length of stay ?12/20/2021 1245 by Turner Daniels R, OT ?Outcome: Not Met (add Reason) ?  ?Problem: RH Toilet Transfers ?Goal: LTG Patient will perform toilet transfers w/assist (OT) ?Description: LTG: Patient will perform toilet transfers with assist, with/without cues using equipment (OT) ?12/20/2021 1245 by Howerton-Davis, Yanel Dombrosky R, OT ?Note: Supervision A currently; limited progress 2/2 very short length of stay ?12/20/2021 1245 by Turner Daniels R, OT ?Outcome: Not Met (add Reason) ?  ?Problem: RH Tub/Shower Transfers ?Goal: LTG Patient will perform tub/shower transfers w/assist (OT) ?Description: LTG: Patient will perform tub/shower transfers with assist, with/without cues using equipment (OT) ?12/20/2021 1245 by Howerton-Davis, Kang Ishida R, OT ?Note: Supervision A currently; limited progress 2/2 very short length of stay ?12/20/2021 1245 by Turner Daniels R, OT ?Outcome: Not Met (add Reason) ?  ?Problem: RH Furniture Transfers ?Goal: LTG Patient will perform furniture transfers w/assist (OT/PT) ?Description: LTG: Patient will perform  furniture transfers  with assistance (OT/PT). ?12/20/2021 1245 by Turner Daniels R, OT ?Note: Supervision A currently; limited  progress 2/2 very short length of stay ?12/20/2021 1245 by Turner Daniels R, OT ?Outcome: Not Met (add Reason) ?  ?

## 2021-12-20 NOTE — Progress Notes (Signed)
Occupational Therapy Session Note ? ?Patient Details  ?Name: Gina Cherry ?MRN: [id number] ?Date of Birth: 03/05/1956 ? ?Today's Date: 12/20/2021 ?OT Individual Time: 0702-0800 ?OT Individual Time Calculation (min): 58 min  ? ? ?Short Term Goals: ?Week 1:  OT Short Term Goal 1 (Week 1): STG=LTG 2/2 ELOS ? ?Skilled Therapeutic Interventions/Progress Updates:  ?Patient met seated EOB eating breakfast and in agreement with OT treatment session. Mild soreness reported in BLE secondary to therapies yesterday. Patient with request for fiance to be signed off on assisting with toilet transfers. Fiance demonstrates ability to turn off bed alarm, don gait belt and safely assist patient with CGA to and from commode. Hand hygiene in standing with CGA. Patient then completed oral hygiene in standing with CGA for steadying. Declined bathing/dressing this a.m. Functional mobility to ADL apartment with CGA and no AD. Patient completed EOB <> supine in standard bed with Mod I. Education on safety in home with use of RW. Patient states that she does not thing RW will fit between drawers and side of bed. Education on HHA from spouse vs switching to other side of the bed. Patient expressed verbal understanding. Tub/shower transfer with BSC and CGA for safety. Patient reports having a grab bar in tub/shower and HHSH. Education on placement of non-slip mat on outside of tub and supervision A from spouse at time of first shower s/p d/c. Patient again expressed verbal understanding. Focus shifted to dynamic standing balance with ball toss to rebound machine. First trial without challenge to balance and 2nd trial on blue foam mat. Patient required CGA to step onto/off off mat 2/2 balance deficits. Functional mobility back to rooom in same manner as indicated above. Session concluded with patient seated in recliner with call bell within reach, chair alarm activated and all needs met.  ? ?Therapy Documentation ?Precautions:   ?Precautions ?Precautions: None ?Precaution Comments: watch SpO2 ?Restrictions ?Weight Bearing Restrictions: No ?Other Position/Activity Restrictions: watch sats and HR ?General: ?  ? ?Therapy/Group: Individual Therapy ? ? R Howerton-Davis ?12/20/2021, 6:50 AM ?

## 2021-12-20 NOTE — Discharge Summary (Signed)
Physician Discharge Summary  ?Patient ID: ?Arville Lime Liberati ?MRN: [id number] ?DOB/AGE: 1955/11/21 66 y.o. ? ?Admit date: 12/18/2021 ?Discharge date: 12/21/2021 ? ?Discharge Diagnoses:  ?Principal Problem: ?  Debility ?Active Problems: ?  Elevated LFTs ?  Tobacco abuse ?  Risk for falls ?  Malnutrition of moderate degree ?  Hyperglycemia ?  Hypokalemia ? ? ?Discharged Condition: good ? ?Significant Diagnostic Studies:N/A ? ? ?Labs:  ?Basic Metabolic Panel: ?Recent Labs  ?Lab 12/19/21 ?0600 12/21/21 ?0617  ?NA 139 135  ?K 3.2* 3.5  ?CL 106 101  ?CO2 26 26  ?GLUCOSE 86 87  ?BUN 6* 6*  ?CREATININE 0.49 0.50  ?CALCIUM 8.5* 8.4*  ? ? ?CBC: ?CBC Latest Ref Rng & Units 12/14/2021 12/13/2021 12/12/2021  ?WBC 4.0 - 10.5 K/uL 7.3 7.9 11.3(H)  ?Hemoglobin 12.0 - 15.0 g/dL 9.2(L) 9.1(L) 9.5(L)  ?Hematocrit 36.0 - 46.0 % 27.3(L) 27.3(L) 28.5(L)  ?Platelets 150 - 400 K/uL 451(H) 367 429(H)  ?  ? ? ?CBG: ?Recent Labs  ?Lab 12/20/21 ?1230 12/20/21 ?1657 12/20/21 ?1746 12/20/21 ?2147 12/21/21 ?0604  ?GLUCAP 137* 65* 124* 150* 87  ? ? ?Brief HPI:   ASCHLEY SALAY is a 66 y.o. female with history of COPD with ongoing tobacco use, osteoporosis otherwise in reasonably good health was admitted on 11/28/2021 with multiple falls, shortness of breath with cough and generalized weakness with diarrhea.  She was treated with broad-spectrum antibiotics but developed ARDS requiring intubation as well as proning.  She was found to have Legionella PNA as well as acute systolic heart failure from Takotsubo cardiomyopathy with EF 30 to 35%.  ? ?She was found to have elevated troponins which was felt to be due to demand ischemia.  Dr. Haroldine Laws was consulted to help manage fluid overload and recommended further cardiac work-up after recovery.  Repeat echo showed improvement of EF to 65 to 70%.  She tolerated extubation without difficulty and was being weaned off supplemental oxygen.  Swallow function was improving however she continued to be limited by  weakness with balance deficits.  CIR was recommended due to functional decline. ? ? ?Hospital Course: CLARISSIA HAVLIK was admitted to rehab 12/18/2021 for inpatient therapies to consist of PT and OT at least three hours five days a week. Past admission physiatrist, therapy team and rehab RN have worked together to provide customized collaborative inpatient rehab.  Her blood pressures were monitored on TID basis and have been controlled.  She continues on low-dose aspirin, metoprolol as well as Aldactone without signs of overload.  Follow-up labs showed hypokalemia which  has improved with brief supplementation and AKI has resolved. Repeat CBC shows H&H to be stable.  Her respiratory status has been stable with increase in activity.  ? ?Small stage I noted on sacrum as well as multiple skin tears on lower extremities which is stable. Her BS were monitored on ac/hs basis and insulin was discontinued as blood sugars were noted to be extremely well controlled.  She has had some asymptomatic hypoglycemia and was advised to eat between meals.  She was also educated on high potassium foods to maintain adequate potassium levels.  Patient and husband have requested short length of stay with discharge to home as soon as able.  She currently requires supervision for safety and will continue to receive follow-up outpatient PT and OT at [facility] outpatient rehab at [facility] after discharge. ? ? ?Rehab course: During patient's stay in rehab weekly team conferences were held to monitor patient's progress, set goals and  discuss barriers to discharge. At admission, patient required GCA with mobility and with ADL tasks.  She  has had improvement in activity tolerance, balance, postural control as well as ability to compensate for deficits. She requires provision with use of rolling walker complete ADL tasks. She is able to ambulate 125' without  and supervision. Patient and family requested short LOS and discharge to home with family  providing 24/7 assist as needed. Family education was completed with husband.  ? ?Disposition: Home ? ?Diet: Heart healthy ? ?Special Instructions: ?1.  No driving or strenuous activity if cleared by MD. ?2.  Recommend follow-up with PCP in 1 week for repeat BMET to monitor potassium levels. ? ? ?Discharge Instructions   ? ? Ambulatory referral to Occupational Therapy   Complete by: As directed ?  ? Eval and treat  ? Ambulatory referral to Physical Therapy   Complete by: As directed ?  ? ?  ? ?Allergies as of 12/21/2021   ? ?   Reactions  ? Codeine Other (See Comments)  ? Unknown reaction (possibly sick stomach)  ? ?  ? ?  ?Medication List  ?  ? ?STOP taking these medications   ? ?alendronate 70 MG tablet ?Commonly known as: FOSAMAX ?  ?Vitamin D3 50 MCG (2000 UT) Tabs ?  ? ?  ? ?TAKE these medications   ? ?acetaminophen 500 MG tablet ?Commonly known as: TYLENOL ?Take 500 mg by mouth every 6 (six) hours as needed for headache (pain). ?  ?albuterol 108 (90 Base) MCG/ACT inhaler ?Commonly known as: VENTOLIN HFA ?Inhale 2 puffs into the lungs every 6 (six) hours as needed for wheezing or shortness of breath. ?  ?aspirin 81 MG chewable tablet ?Chew 1 tablet (81 mg total) by mouth daily. ?  ?fluticasone furoate-vilanterol 200-25 MCG/ACT Aepb ?Commonly known as: BREO ELLIPTA ?Inhale 1 puff into the lungs daily. ?  ?Hydrocortisone Ace-Pramoxine 2.5-1 % Crea ?Apply 1 application topically 2 (two) times daily as needed (hemorrhoids). ?  ?metoprolol tartrate 25 MG tablet ?Commonly known as: LOPRESSOR ?Take 1 tablet (25 mg total) by mouth 2 (two) times daily. ?  ?multivitamin with minerals Tabs tablet ?Take 1 tablet by mouth at bedtime. ?  ?nicotine 21 mg/24hr patch ?Commonly known as: NICODERM CQ - dosed in mg/24 hours ?Place 1 patch (21 mg total) onto the skin daily. ?Notes to patient: Do not smoke with patch in place ?  ?spironolactone 25 MG tablet ?Commonly known as: ALDACTONE ?Take 0.5 tablets (12.5 mg total) by mouth  daily. ?  ?umeclidinium bromide 62.5 MCG/ACT Aepb ?Commonly known as: INCRUSE ELLIPTA ?Inhale 1 puff into the lungs daily. ?  ? ?  ? ? Follow-up Information   ? ? Izora Ribas, MD Follow up.   ?Specialty: Physical Medicine and Rehabilitation ?Why: 7/25 please arrive at 12:40pm for 1:00pm appointment, thank you! ?Contact information: ?[address] ?[place] [address] ?[phone] ? ? ?  ?  ? ? Bensimhon, Shaune Pascal, MD. Call.   ?Specialty: Cardiology ?Why: for follow up on cardiac issues ?Contact information: ?[address] ?[address] ?[place] [address] ?[phone] ? ? ?  ?  ? ? Vania Rea, MD. Call today.   ?Specialty: Obstetrics and Gynecology ?Why: for post hospital follow up in 7-10 days ?Contact information: ?[address] ?[address] ?[place] [address] ?[phone] ? ? ?  ?  ? ?  ?  ? ?  ? ? ?Signed: ?Ivan Anchors Avy Barlett ?12/21/2021, 4:50  PM ?  ?

## 2021-12-20 NOTE — Progress Notes (Signed)
Occupational Therapy Session Note ? ?Patient Details  ?Name: Gina Cherry ?MRN: [id number] ?Date of Birth: 1956/06/04 ? ?Today's Date: 12/20/2021 ?OT Individual Time: 1130-1200 ?OT Individual Time Calculation (min): 30 min  ? ? ?Short Term Goals: ?Week 1:  OT Short Term Goal 1 (Week 1): STG=LTG 2/2 ELOS ? ?Skilled Therapeutic Interventions/Progress Updates:  ?Skilled OT intervention completed with focus on home management and DME education. Pt received seated EOB with nursing in room and pt's husband. DME had been delivered to pt's room with this therapist providing education on 3 in 1 BSC assembly, purpose, the bed-side and over toilet options, as well as bagging method for increased hygiene and management of waste. Pt completed supervised ambulatory transfer <> the toilet without AD, completed all pericare with distant supervision. Education provided to pt on no f/u OT recommendation due to pt's rec off OPPT and insurance not covering both home and outpatient services as well as due to pt's functional level. Education provided to both pt and husband about supervision, energy conservation. Pt was left EOB, with husband in room, bed alarm on and all needs in reach at end of session. ? ? ?Therapy Documentation ?Precautions:  ?Precautions ?Precautions: None ?Precaution Comments: watch SpO2 ?Restrictions ?Weight Bearing Restrictions: No ?Other Position/Activity Restrictions: watch sats and HR ? ?Pain: ?No c/o pain ? ?Therapy/Group: Individual Therapy ? ?Gina Cherry ?12/20/2021, 7:58 AM ?

## 2021-12-21 LAB — BASIC METABOLIC PANEL
Anion gap: 8 (ref 5–15)
BUN: 6 mg/dL — ABNORMAL LOW (ref 8–23)
CO2: 26 mmol/L (ref 22–32)
Calcium: 8.4 mg/dL — ABNORMAL LOW (ref 8.9–10.3)
Chloride: 101 mmol/L (ref 98–111)
Creatinine, Ser: 0.5 mg/dL (ref 0.44–1.00)
GFR, Estimated: >60 mL/min (ref >60)
Glucose, Bld: 87 mg/dL (ref 70–99)
Potassium: 3.5 mmol/L (ref 3.5–5.1)
Sodium: 135 mmol/L (ref 135–145)

## 2021-12-21 LAB — GLUCOSE, CAPILLARY: Glucose-Capillary: 87 mg/dL (ref 70–99)

## 2021-12-21 NOTE — Progress Notes (Signed)
?                                                       PROGRESS NOTE ? ? ?Subjective/Complaints: ?Stable for d/c ?Reviewed lab work ?Provided dietary education ?Discussed plan for d/c ? ?ROS: denies pain, constipation ? ?Objective: ?  ?No results found. ?No results for input(s): WBC, HGB, HCT, PLT in the last 72 hours. ?Recent Labs  ?  12/19/21 ?0600 12/21/21 ?0617  ?NA 139 135  ?K 3.2* 3.5  ?CL 106 101  ?CO2 26 26  ?GLUCOSE 86 87  ?BUN 6* 6*  ?CREATININE 0.49 0.50  ?CALCIUM 8.5* 8.4*  ? ? ?Intake/Output Summary (Last 24 hours) at 12/21/2021 0900 ?Last data filed at 12/21/2021 7510 ?Gross per 24 hour  ?Intake 598 ml  ?Output --  ?Net 598 ml  ?  ? ?  ? ?Physical Exam: ?Vital Signs ?Blood pressure 116/62, pulse 88, temperature 98.4 ?F (36.9 ?C), resp. rate 16, height 5\' 4"  (1.626 m), weight 45.3 kg, SpO2 98 %. ?Gen: no distress, normal appearing, BMI 17.14 ?HEENT: oral mucosa pink and moist, NCAT ?Cardio: Reg rate ?Chest: normal effort, normal rate of breathing ?Abd: soft, non-distended ?Ext: no edema ?Psych: pleasant, normal affect ?Skin: ?   General: Skin is warm and dry.  ?   Comments: Small stg 1 sacrum. Skin tears bilateral lower legs  ?Neurological:  ?   General: No focal deficit present.  ?   Mental Status: She is oriented to person, place, and time.  ?   Cranial Nerves: No cranial nerve deficit.  ?   Sensory: No sensory deficit.  ?   Comments: Motor 4-5/5 UE, 4/5 LE prox to distal. No abnl tone. Normal coordination.   ?Psychiatric:     ?   Mood and Affect: Mood normal.     ?   Behavior: Behavior normal.     ?   Thought Content: Thought content normal.     ?   Judgment: Judgment normal. ? ? ?Assessment/Plan: ?1. Functional deficits which require 3+ hours per day of interdisciplinary therapy in a comprehensive inpatient rehab setting. ?Physiatrist is providing close team supervision and 24 hour management of active medical problems listed below. ?Physiatrist and rehab team continue to assess barriers to  discharge/monitor patient progress toward functional and medical goals ? ?Care Tool: ? ?Bathing ?   ?Body parts bathed by patient: Right arm, Left arm, Chest, Abdomen, Front perineal area, Buttocks, Right upper leg, Left upper leg, Right lower leg, Left lower leg, Face  ?   ?  ?  ?Bathing assist Assist Level: Contact Guard/Touching assist ?  ?  ?Upper Body Dressing/Undressing ?Upper body dressing   ?What is the patient wearing?: Pull over shirt ?   ?Upper body assist Assist Level: Set up assist ?   ?Lower Body Dressing/Undressing ?Lower body dressing ? ? ?   ?What is the patient wearing?: Pants, Underwear/pull up ? ?  ? ?Lower body assist Assist for lower body dressing: Contact Guard/Touching assist ?   ? ?Toileting ?Toileting    ?Toileting assist Assist for toileting: Contact Guard/Touching assist ?  ?  ?Transfers ?Chair/bed transfer ? ?Transfers assist ?   ? ?Chair/bed transfer assist level: Independent with assistive device ?  ?  ?Locomotion ?Ambulation ? ? ?Ambulation assist ? ?   ? ?  Assist level: Supervision/Verbal cueing ?Assistive device: No Device ?Max distance: >169ft  ? ?Walk 10 feet activity ? ? ?Assist ?   ? ?Assist level: Supervision/Verbal cueing ?Assistive device: No Device  ? ?Walk 50 feet activity ? ? ?Assist   ? ?Assist level: Supervision/Verbal cueing ?Assistive device: No Device  ? ? ?Walk 150 feet activity ? ? ?Assist   ? ?Assist level: Supervision/Verbal cueing ?Assistive device: No Device ?  ? ?Walk 10 feet on uneven surface  ?activity ? ? ?Assist   ? ? ?Assist level: Contact Guard/Touching assist ?   ? ?Wheelchair ? ? ? ? ?Assist Is the patient using a wheelchair?: No ?  ?  ? ?  ?   ? ? ?Wheelchair 50 feet with 2 turns activity ? ? ? ?Assist ? ?  ?  ? ? ?   ? ?Wheelchair 150 feet activity  ? ? ? ?Assist ?   ? ? ?   ? ?Blood pressure 116/62, pulse 88, temperature 98.4 ?F (36.9 ?C), resp. rate 16, height 5\' 4"  (1.626 m), weight 45.3 kg, SpO2 98 %. ? ?Medical Problem List and Plan: ?1.  Functional deficits secondary to debility after Acute respiratory failure d/t legionella pneumonia ?            -patient may shower ?            -ELOS/Goals: 5 days, mod I ? D/c today ?2.  Antithrombotics: ?-DVT/anticoagulation:  Pharmaceutical: Lovenox ?            -antiplatelet therapy: asa ?3. Pain: continue tylenol prn.  ?4. Mood: LCSW to follow for evaluation and support.  ?            -antipsychotic agents: N/A ?5. Neuropsych: This patient is capable of making decisions on her own behalf. ?6. Skin/Wound Care: Maintain adequate nutrition and hydration status.  ?            --PRAFO's/ pressure-relief measures for heels, lower legs, sacrum ?            -OOB ?7. Fluids/Electrolytes/Nutrition: monitor I/O. Check CMET on 03/06 ?8.  Acute systolic heart failure: initial ECHO with EF 30-45% but repeat demonstrated EF 65-70%. ?-Monitor weights daily.   ?-Low-salt diet.   ?-Monitor for signs of overload ?            --on ASA, metoprolol and low dose aldactone.  ?            -outpt cards follow up ?9.  Legionella PNA: Hypoxic respiratory resolved and has completed Levaquin for treatment. ?10.  Stress induced hyperglycemia: Hemoglobin A1c 5.5. Likely due to tube feeds.  ?-- Continue to monitor blood sugars ac/hs ?            -- d/c semglee ? -d/c correction scale ?11.  COPD: Continue Incruse and Breo. Wean oxygen as table to keep sats > 88%  ?12. Acute renal failure: avoid nephrotoxic meds. SCr ranging 3.4 to 0.41 despite fluids.  ?--recheck 03/06 . ?13. Abnormal LFTs: Likely due to alcohol use and shock-->improving. ?            -- Continue to trend for recovery. ? -discussed excellent improvements with her ?14. Hypokalemia: supplemented 05/06 BID, improved, discussed high potassium foods for home ?15. Hypoalbuminemia: encouraged high protein foods ?16. Underweight: liberalize diet to regular. Provided dietary education ? ? >30 minutes spent in discharge of patient including review of medications and follow-up  appointments, physical examination, and in answering all patient's  questions  ? ?LOS: ?3 days ?A FACE TO FACE EVALUATION WAS PERFORMED ? ?Clint Bolder P Sammy Douthitt ?12/21/2021, 9:00 AM  ? ?  ?

## 2021-12-21 NOTE — IPOC Note (Signed)
Overall Plan of Care (IPOC) ?Patient Details ?Name: Gina Cherry ?MRN: [id number] ?DOB: Jul 22, 1956 ? ?Admitting Diagnosis: Debility ? ?Hospital Problems: Principal Problem: ?  Debility ?Active Problems: ?  Elevated LFTs ?  Tobacco abuse ?  Risk for falls ?  Malnutrition of moderate degree ?  Hyperglycemia ?  Hypokalemia ? ? ? ? Functional Problem List: ?Nursing Nutrition, Bladder, Safety, Medication Management, Skin Integrity  ?PT Balance, Endurance, Motor, Safety, Sensory  ?OT Balance, Endurance, Safety  ?SLP    ?TR    ?    ? Basic ADL?s: ?OT Grooming, Bathing, Dressing, Toileting  ? ?  Advanced  ADL?s: ?OT Simple Meal Preparation  ?   ?Transfers: ?PT Bed Mobility, Bed to Chair, Car, Furniture  ?OT Toilet, Tub/Shower  ? ?  Locomotion: ?PT Ambulation, Stairs  ? ?  Additional Impairments: ?OT None  ?SLP   ?  ?   ?TR    ? ? ?Anticipated Outcomes ?Item Anticipated Outcome  ?Self Feeding N/A  ?Swallowing ?   ?  ?Basic self-care ? Mod I  ?Toileting ? Mod I ?  ?Bathroom Transfers Mod I  ?Bowel/Bladder ? Pt will be continent x 2  ?Transfers ? mod(I)  ?Locomotion ? mod(I)  ?Communication ?    ?Cognition ?    ?Pain ? less than 3  ?Safety/Judgment ? Pt will remain fall free while in rehab  ? ?Therapy Plan: ?PT Intensity: Minimum of 1-2 x/day ,45 to 90 minutes ?PT Frequency: 5 out of 7 days ?PT Duration Estimated Length of Stay: 3-5 Days ?OT Intensity: Minimum of 1-2 x/day, 45 to 90 minutes ?OT Frequency: 5 out of 7 days ?OT Duration/Estimated Length of Stay: 5-7 days ?   ? ?Due to the current state of emergency, patients may not be receiving their 3-hours of Medicare-mandated therapy. ? ? Team Interventions: ?Nursing Interventions Patient/Family Education, Disease Management/Prevention, Skin Care/Wound Management, Discharge Planning, Bladder Management, Medication Management  ?PT interventions Ambulation/gait training, Community reintegration, DME/adaptive equipment instruction, Neuromuscular re-education, Psychosocial  support, Stair training, UE/LE Strength taining/ROM, Warden/ranger, Discharge planning, Skin care/wound management, Therapeutic Activities, UE/LE Coordination activities, Disease management/prevention, Functional mobility training, Patient/family education, Therapeutic Exercise  ?OT Interventions Warden/ranger, [employer] reintegration, Discharge planning, DME/adaptive equipment instruction, Functional mobility training, Patient/family education, Self Care/advanced ADL retraining, Therapeutic Activities, Therapeutic Exercise, UE/LE Strength taining/ROM  ?SLP Interventions    ?TR Interventions    ?SW/CM Interventions Discharge Planning, Psychosocial Support, Patient/Family Education, Disease Management/Prevention  ? ?Barriers to Discharge ?MD  Medical stability  ?Nursing Decreased caregiver support ?1 level 3 ste left rail with partner  ?PT   ?   ?OT   ?   ?SLP   ?   ?SW Other (comments), New diabetic, Wound Care, Insurance for SNF coverage, Lack of/limited family support, Decreased caregiver support ?oxygen, Pressure Injury  ? ?Team Discharge Planning: ?Destination: PT-Home ,OT- Home , SLP-Home ?Projected Follow-up: PT-Outpatient PT, OT-  None, SLP-None ?Projected Equipment Needs: PT-To be determined, OT- To be determined, SLP-None recommended by SLP ?Equipment Details: PT- , OT-  ?Patient/family involved in discharge planning: PT- Patient,  OT-Patient, SLP-Patient, Family member/caregiver ? ?MD ELOS: 3 days ?Medical Rehab Prognosis:  Excellent ?Assessment: The patient has been admitted for CIR therapies with the diagnosis of debility. The team will be addressing functional mobility, strength, stamina, balance, safety, adaptive techniques and equipment, self-care, bowel and bladder mgt, patient and caregiver education. Goals have been set at modI. Anticipated discharge destination is home. Medications are being managed, and labs and vitals  are being monitored regularly.    ? ? ?See Team  Conference Notes for weekly updates to the plan of care  ?

## 2021-12-28 ENCOUNTER — Other Ambulatory Visit (HOSPITAL_COMMUNITY): Payer: Self-pay

## 2021-12-30 ENCOUNTER — Ambulatory Visit: Payer: Medicare HMO | Admitting: Occupational Therapy

## 2021-12-30 ENCOUNTER — Other Ambulatory Visit: Payer: Self-pay

## 2021-12-30 ENCOUNTER — Encounter: Payer: Self-pay | Admitting: Occupational Therapy

## 2021-12-30 ENCOUNTER — Ambulatory Visit: Payer: Medicare HMO | Attending: Physical Medicine and Rehabilitation

## 2021-12-30 DIAGNOSIS — M6281 Muscle weakness (generalized): Secondary | ICD-10-CM

## 2021-12-30 DIAGNOSIS — R262 Difficulty in walking, not elsewhere classified: Secondary | ICD-10-CM | POA: Diagnosis not present

## 2021-12-30 DIAGNOSIS — R2681 Unsteadiness on feet: Secondary | ICD-10-CM

## 2021-12-30 DIAGNOSIS — R5381 Other malaise: Secondary | ICD-10-CM | POA: Diagnosis not present

## 2021-12-30 NOTE — Patient Instructions (Signed)
11. Grip Strengthening (Resistive Putty) ? ? ?Squeeze putty using thumb and all fingers. ?Repeat 15 times. Do 1-2 sessions per day. ? ? ?Extension (Assistive Putty) ? ? ?Roll putty back and forth, being sure to use all fingertips. ?Repeat 3 times. Do 1-2 sessions per day.  Then pinch as below. ? ? ?Palmar Pinch Strengthening (Resistive Putty) ? ? ?Pinch putty between thumb and each fingertip in turn after rolling out  ? ? ?Finger and Thumb Extension (Resistive Putty) ? ? ?With thumb and all fingers in center of putty donut, stretch out. ?Repeat 5-10 times. Do 1-2 sessions per day. ? ? ? ? ? ? ? ?MP Flexion (Resistive Putty) ? ? ?Bending only at large knuckles, press putty down against thumb. Keep fingertips straight. ?Repeat 10 times. Do 1-2 sessions per day. ? ? ? ? ? ? ? ? ? ? ? ? ? ? ? ? ? ? ? ? ? ? ? ? ? ? ? ? ? ?  ? ?

## 2021-12-30 NOTE — Therapy (Signed)
[place] [facility] ?[address], [address] ?[place], [place], [address] ?Phone: [phone]   Fax:  [phone] ? ?Physical Therapy Evaluation ? ?Patient Details  ?Name: Gina Cherry ?MRN: [id number] ?Date of Birth: 1955/12/07 ?Referring Provider (PT): Love, Evlyn Kanner, PA-C ? ? ?Encounter Date: 12/30/2021 ? ? PT End of Session - 12/30/21 1024   ? ? Visit Number 1   ? Number of Visits 3   ? Date for PT Re-Evaluation 01/27/22   ? Authorization Type [employer] 2023   ? Authorization Time Period auth not required, no VL   ? PT Start Time 1024   late arrival  ? PT Stop Time 1100   ? PT Time Calculation (min) 36 min   ? Activity Tolerance Patient tolerated treatment well   ? Behavior During Therapy [facility] for tasks assessed/performed;Anxious   ? ?  ?  ? ?  ? ? ?Past Medical History:  ?Diagnosis Date  ? COPD (chronic obstructive pulmonary disease) (HCC)   ? Osteoporosis   ? Underweight   ? ? ?No past surgical history on file. ? ?There were no vitals filed for this visit. ? ? ? Subjective Assessment - 12/30/21 1027   ? ? Subjective Hospitalized 11/28/21 found to have Legionella PNA as well as acute systolic heart failure from Takotsubo cardiomyopathy with EF 30 to 35% and was admitted to inpatient rehab for 3 days with D/C to home. Pt reports no serious issues have arisen since returning home from hospital and has been able to walk in the home without AD and reports no issue with household mobility at present. Pt reports some community excursions since returning home and has noted any issues other than fatigue   ? Limitations Walking   ? How long can you walk comfortably? 5-10 min   ? Currently in Pain? No/denies   ? Pain Score 0-No pain   ? ?  ?  ? ?  ? ? ? ? ? [facility] PT Assessment - 12/30/21 0001   ? ?  ? Assessment  ? Medical Diagnosis Debility   ? Referring Provider (PT) Love, Evlyn Kanner, PA-C   ?  ? Balance Screen  ? Has the patient fallen in the past 6 months Yes   ? How many times? 1    ? Has the patient had a decrease in activity level because of a fear of falling?  No   ? Is the patient reluctant to leave their home because of a fear of falling?  No   ?  ? Home Environment  ? Living Environment Private residence   ? Living Arrangements Spouse/significant other   ? Available Help at Discharge Family   ? Type of Home House   ? Home Access Stairs to enter   ? Entrance Stairs-Number of Steps 3   ? Home Equipment Walker - 2 wheels   ?  ? Prior Function  ? Level of Independence Independent   ? Vocation Retired   ? Leisure socialize with friends   ?  ? Coordination  ? Gross Motor Movements are Fluid and Coordinated Yes   ? Fine Motor Movements are Fluid and Coordinated Yes   ?  ? ROM / Strength  ? AROM / PROM / Strength AROM   ?  ? AROM  ? Overall AROM  Within functional limits for tasks performed   ?  ? Strength  ? Overall Strength Within functional limits for tasks performed   ?  Overall Strength Comments 4/5 gross BLE   ?  ? Transfers  ? Transfers Sit to Stand   ? Sit to Stand 7: Independent   ? Five time sit to stand comments  11.73   ?  ? Ambulation/Gait  ? Ambulation/Gait Yes   ? Ambulation/Gait Assistance 7: Independent   ? Assistive device None   ? Gait Pattern Wide base of support   ? Gait velocity 4.11 ft/ sec   ?  ? 6 minute walk test results   ? Endurance additional comments pt reports perofrming at inpatient rehab where she traversed nearly 700 ft during that time   ?  ? Standardized Balance Assessment  ? Standardized Balance Assessment Berg Balance Test   ?  ? Berg Balance Test  ? Sit to Stand Able to stand without using hands and stabilize independently   ? Standing Unsupported Able to stand safely 2 minutes   ? Sitting with Back Unsupported but Feet Supported on Floor or Stool Able to sit safely and securely 2 minutes   ? Stand to Sit Sits safely with minimal use of hands   ? Transfers Able to transfer safely, minor use of hands   ? Standing Unsupported with Eyes Closed Able to  stand 10 seconds with supervision   ? Standing Unsupported with Feet Together Able to place feet together independently and stand 1 minute safely   ? From Standing, Reach Forward with Outstretched Arm Can reach confidently >25 cm (10")   ? From Standing Position, Pick up Object from Floor Able to pick up shoe safely and easily   ? From Standing Position, Turn to Look Behind Over each Shoulder Looks behind from both sides and weight shifts well   ? Turn 360 Degrees Able to turn 360 degrees safely in 4 seconds or less   ? Standing Unsupported, Alternately Place Feet on Step/Stool Able to stand independently and safely and complete 8 steps in 20 seconds   ? Standing Unsupported, One Foot in [employer] balance while stepping or standing   ? Standing on One Leg Tries to lift leg/unable to hold 3 seconds but remains standing independently   ? Total Score 48   ? ?  ?  ? ?  ? ? ? ? ? ? ? ? ? ? ? ? ? ?Objective measurements completed on examination: See above findings.  ? ? ? ? ? ? ? ? ? ? Balance Exercises - 12/30/21 0001   ? ?  ? Balance Exercises: Standing  ? Tandem Stance Eyes open;Upper extremity support 1;2 reps;15 secs   ? SLS Eyes open;Upper extremity support 1;2 reps;10 secs   ? ?  ?  ? ?  ? ? ? ? ? PT Education - 12/30/21 1233   ? ? Education Details education on assessment findings and rationale for PT intervention   ? Person(s) Educated Patient   ? Methods Explanation   ? Comprehension Verbalized understanding   ? ?  ?  ? ?  ? ? ? PT Short Term Goals - 12/30/21 1237   ? ?  ? PT SHORT TERM GOAL #1  ? Title Patient will be independent in HEP to improve functional outcomes   ? Time 2   ? Period Weeks   ? Status New   ? Target Date 01/13/22   ? ?  ?  ? ?  ? ? ? ? PT Long Term Goals - 12/30/21 1239   ? ?  ? PT  LONG TERM GOAL #1  ? Title Demonstrate improved balance per [employer] Test score 52/56   ? Baseline 48/56   ? Time 3   ? Period Weeks   ? Status New   ? Target Date 01/20/22   ?  ? PT LONG TERM GOAL #2  ?  Title Report/demonstrate improved endurance as evidenced by ability to ambulate x 15 min without fatigue   ? Baseline 5-10 min self-report   ? Time 3   ? Period Weeks   ? Status New   ? Target Date 01/20/22   ? ?  ?  ? ?  ? ? ? ? ? ? ? ? ? Plan - 12/30/21 1233   ? ? Clinical Impression Statement Pt is 66 yo lady with recent hx of hospitalization related to PNA and subsequent cardiac issues who presents to outpatient therapy following D/C from inpatient rehabilitation.  Pt demonstrates some mild gait and balance deviations ambulating with wide BOS and score of 48/56 Berg Balance Test.  Pt experiences difficulty with single limb and narrow BOS positions and reports chief limitation being endurance only able to ambulate 10-15 minutes before fatigue dictates rest period.  PT services indicated to develop/instruct in HEP and improve dynamic balance to lessen risk for falls and facilitate return to unrestricted community-level activity   ? Personal Factors and Comorbidities Comorbidity 2   ? Comorbidities COPD, osteoporosis   ? Examination-Activity Limitations Stairs;Locomotion Level   ? Examination-Participation Restrictions Community Activity   ? Stability/Clinical Decision Making Stable/Uncomplicated   ? Clinical Decision Making Low   ? Rehab Potential Excellent   ? PT Frequency 1x / week   ? PT Duration 3 weeks   ? PT Treatment/Interventions ADLs/Self Care Home Management;DME Instruction;Gait training;Functional mobility training;Therapeutic activities;Therapeutic exercise;Balance training;Neuromuscular re-education   ? PT Next Visit Plan Treadmill endurance?   ? PT Home Exercise Plan tandem and single leg balance at counter   ? Consulted and Agree with Plan of Care Patient   ? ?  ?  ? ?  ? ? ?Patient will benefit from skilled therapeutic intervention in order to improve the following deficits and impairments:  Decreased activity tolerance, Decreased endurance, Decreased balance ? ?Visit Diagnosis: ?Difficulty in  walking, not elsewhere classified ? ? ? ? ?Problem List ?Patient Active Problem List  ? Diagnosis Date Noted  ? Hyperglycemia 12/20/2021  ? Hypokalemia 12/20/2021  ? Debility 12/18/2021  ? Pressure injury of sk

## 2021-12-30 NOTE — Therapy (Signed)
[place] ?[facility] ?[address], [address] ?[place], [place], [address] ?Phone: [phone]   Fax:  [phone] ? ?Occupational Therapy Evaluation ? ?Patient Details  ?Name: Gina Cherry ?MRN: [id number] ?Date of Birth: Dec 16, 1955 ?Referring Provider (OT): Delle Reining ? ? ?Encounter Date: 12/30/2021 ? ? OT End of Session - 12/30/21 1205   ? ? Visit Number 1   ? Number of Visits 1   ? Authorization Type Aetna Medicare   ? OT Start Time 1100   ? OT Stop Time 1150   ? OT Time Calculation (min) 50 min   ? Activity Tolerance Patient tolerated treatment well   ? Behavior During Therapy [facility] for tasks assessed/performed   ? ?  ?  ? ?  ? ? ?Past Medical History:  ?Diagnosis Date  ? COPD (chronic obstructive pulmonary disease) (HCC)   ? Osteoporosis   ? Underweight   ? ? ?History reviewed. No pertinent surgical history. ? ?There were no vitals filed for this visit. ? ? Subjective Assessment - 12/30/21 1105   ? ? Subjective  I maybe lost a little strength in my hand (right) - Hard to open bottles   ? Currently in Pain? No/denies   ? Pain Score 0-No pain   ? ?  ?  ? ?  ? ? ? ? [facility] OT Assessment - 12/30/21 1111   ? ?  ? Assessment  ? Medical Diagnosis Debility   ? Referring Provider (OT) Delle Reining   ? Onset Date/Surgical Date 11/28/21   ? Hand Dominance Right   ? Prior Therapy CIR 3/5-3/8   ?  ? Precautions  ? Precautions Fall   ?  ? Prior Function  ? Level of Independence Independent with basic ADLs;Independent with homemaking with ambulation;Independent with community mobility without device   ? Vocation Retired   ? Leisure TV, Caring for mom, hanging out with boyfriend   ?  ? ADL  ? Eating/Feeding Independent   ? Grooming Independent   ? Upper Body Bathing Supervision/safety   ? Lower Body Bathing Supervision/safety   ? Upper Body Dressing Independent   ? Lower Body Dressing Independent   ? Toilet Transfer Independent   ? Toileting - Clothing Manipulation Independent   ? Toileting -   Hygiene Independent   ? ADL comments Has not tried showering at home yet.  Has been home since 3/8.  Discussed at length shower strategies with patient and boyfriend.  Both are in agreement   ?  ? IADL  ? Prior Level of Function Light Housekeeping Independent   ? Light Housekeeping Performs light daily tasks such as dishwashing, bed making   ? Prior Level of Function Meal Prep Independent   ? Meal Prep Able to complete simple cold meal and snack prep;Able to complete simple warm meal prep   ? Prior Level of Function Medication Managment Independent   ? Medication Management Is responsible for taking medication in correct dosages at correct time   ?  ? Vision - History  ? Baseline Vision Wears contact   ? Visual History --   Astigmatism  ? Additional Comments No changes   ?  ? Vision Assessment  ? Eye Alignment Within Functional Limits   ?  ? Activity Tolerance  ? Activity Tolerance Comments Building up endurance daily   ?  ? Cognition  ? Overall Cognitive Status Within Functional Limits for tasks assessed   ?  ? Observation/Other Assessments  ? Focus  on Therapeutic Outcomes (FOTO)  NA   ?  ? Posture/Postural Control  ? Posture/Postural Control No significant limitations   ?  ? Sensation  ? Light Touch Appears Intact   ?  ? Coordination  ? Gross Motor Movements are Fluid and Coordinated Yes   ? Fine Motor Movements are Fluid and Coordinated Yes   ? 9 Hole Peg Test Right;Left   ? Right 9 Hole Peg Test 24   ? Left 9 Hole Peg Test 21.85   ?  ? ROM / Strength  ? AROM / PROM / Strength AROM;Strength   ?  ? AROM  ? Overall AROM  Within functional limits for tasks performed   ? Overall AROM Comments BUE   ?  ? Strength  ? Overall Strength Within functional limits for tasks performed   ? Overall Strength Comments 4/5 BUE   ?  ? Hand Function  ? Right Hand Gross Grasp Impaired   ? Right Hand Grip (lbs) 18   ? Right Hand Lateral Pinch 8 lbs   ? Left Hand Gross Grasp Impaired   ? Left Hand Grip (lbs) 18   ? Left Hand Lateral  Pinch 10 lbs   ? ?  ?  ? ?  ? ? ? ? ? ? ? ? ? ? ? ? ? ? ? ? ? ? ? OT Education - 12/30/21 1204   ? ? Education Details Safety with shower, equipment and supervision level, putty exercise to increase hand strength   ? Person(s) Educated Patient;Spouse   ? Methods Explanation;Demonstration;Tactile cues;Verbal cues;Handout   ? Comprehension Verbalized understanding;Returned demonstration   ? ?  ?  ? ?  ? ? ? ? ? ? ? ? ? ? ? ? ? Plan - 12/30/21 1205   ? ? Clinical Impression Statement Patient is a 66 yr old female referred to OT following prolonged hospitalization with ARDS / PNA and acute systolic heart failure.  Patient had brief stay in inpatient rehab 3/5-12/21/21 and has been home with boyfriend since that time.  Patient arrived to OT evaluation walking without walker, and without loss of balance, Patient presents with weakness in bilateral hands, mild balance deficits, and mild endurance deficits.  Patient will be seen for OP PT to address balance, strength, and endurance.  Patient issued hand strengthening exercises, and discussed shower safety with patient and boyfriend.  No further OT warranted at this time.   ? OT Occupational Profile and History Problem Focused Assessment - Including review of records relating to presenting problem   ? Occupational performance deficits (Please refer to evaluation for details): ADL's   ? Body Structure / Function / Physical Skills ADL;Strength;Balance;Endurance   ? Clinical Decision Making Limited treatment options, no task modification necessary   ? Comorbidities Affecting Occupational Performance: May have comorbidities impacting occupational performance   ? Modification or Assistance to Complete Evaluation  No modification of tasks or assist necessary to complete eval   ? OT Treatment/Interventions Self-care/ADL training;Therapeutic exercise   ? Plan No further OT warranted.  Issued HEP hand strengthening, and shower safety technique   ? Consulted and Agree with Plan of Care  Patient;Family member/caregiver   ? ?  ?  ? ?  ? ? ?Patient will benefit from skilled therapeutic intervention in order to improve the following deficits and impairments:   ?Body Structure / Function / Physical Skills: ADL, Strength, Balance, Endurance ?  ?  ? ? ?Visit Diagnosis: ?Muscle weakness (generalized) - Plan: Ot  plan of care cert/re-cert ? ?Unsteadiness on feet - Plan: Ot plan of care cert/re-cert ? ? ? ?Problem List ?Patient Active Problem List  ? Diagnosis Date Noted  ? Hyperglycemia 12/20/2021  ? Hypokalemia 12/20/2021  ? Debility 12/18/2021  ? Pressure injury of skin 12/08/2021  ? Malnutrition of moderate degree 12/01/2021  ? Takotsubo cardiomyopathy 11/30/2021  ? Tobacco abuse 11/29/2021  ? Risk for falls 11/29/2021  ? Protein-calorie malnutrition, severe 11/29/2021  ? Septic shock (HCC) 11/28/2021  ? CAP (community acquired pneumonia)   ? Weakness   ? Acute respiratory failure (HCC)   ? Elevated LFTs   ? ? ?Collier Salina, OT ?12/30/2021, 12:13 PM ? ?[place] ?[facility] ?[address], [address] ?[place], [place], [address] ?Phone: [phone]   Fax:  [phone] ? ?Name: Gina Cherry ?MRN: [id number] ?Date of Birth: May 08, 1956 ? ?

## 2022-01-04 ENCOUNTER — Ambulatory Visit: Payer: Medicare HMO | Admitting: Physical Therapy

## 2022-01-04 ENCOUNTER — Encounter: Payer: Self-pay | Admitting: Physical Therapy

## 2022-01-04 ENCOUNTER — Other Ambulatory Visit: Payer: Self-pay

## 2022-01-04 DIAGNOSIS — R2681 Unsteadiness on feet: Secondary | ICD-10-CM

## 2022-01-04 DIAGNOSIS — R262 Difficulty in walking, not elsewhere classified: Secondary | ICD-10-CM | POA: Diagnosis not present

## 2022-01-04 DIAGNOSIS — M6281 Muscle weakness (generalized): Secondary | ICD-10-CM | POA: Diagnosis not present

## 2022-01-04 DIAGNOSIS — R5381 Other malaise: Secondary | ICD-10-CM | POA: Diagnosis not present

## 2022-01-04 NOTE — Therapy (Addendum)
[place] ?[facility] ?[address], [address] ?[place], [place], [address] ?Phone: [phone]   Fax:  [phone] ? ?Physical Therapy Treatment ? ?Patient Details  ?Name: Gina Cherry ?MRN: [id number] ?Date of Birth: 08-11-1956 ?Referring Provider (PT): Love, Evlyn Kanner, PA-C ? ? ?Encounter Date: 01/04/2022 ? ? PT End of Session - 01/04/22 1925   ? ? Visit Number 2   ? Number of Visits 3   ? Date for PT Re-Evaluation 01/27/22   ? Authorization Type Aetna Medicare 2023   ? Authorization Time Period auth not required, no VL   ? PT Start Time 1103   ? PT Stop Time 1146   ? PT Time Calculation (min) 43 min   ? Activity Tolerance Patient tolerated treatment well   ? Behavior During Therapy [facility] for tasks assessed/performed;Anxious   ? ?  ?  ? ?  ? ? ?Past Medical History:  ?Diagnosis Date  ? COPD (chronic obstructive pulmonary disease) (HCC)   ? Osteoporosis   ? Underweight   ? ? ?History reviewed. No pertinent surgical history. ? ?There were no vitals filed for this visit. ? ? Subjective Assessment - 01/04/22 1101   ? ? Subjective Pt reports no problems or changes;  states biggest problem is getting tired easily; reports walking daily - walking approx. 5" at a time - trying to increase it   ? Limitations Walking   ? How long can you walk comfortably? 5-10 min   ? Currently in Pain? No/denies   ? ?  ?  ? ?  ? ? ? ? ? ? ? ? ? ? ? ? ? ? ? ? ? ? ? ? [facility] Adult PT Treatment/Exercise - 01/04/22 1114   ? ?  ? Transfers  ? Transfers Sit to Stand   ? Number of Reps Other reps (comment);10 reps   3  ? Comments no UE support used   ?  ? Ambulation/Gait  ? Ambulation/Gait Yes   ? Ambulation/Gait Assistance 5: Supervision   ? Ambulation Distance (Feet) 430 Feet   ? Assistive device None   ? Gait Pattern Within Functional Limits   ?  ? Exercises  ? Exercises Knee/Hip;Ankle   ?  ? Knee/Hip Exercises: Aerobic  ? Nustep level 4 x 7" with UE's and LE's   ?  ? Knee/Hip Exercises: Standing  ? Heel Raises  Both;1 set;10 reps   ? Hip Flexion Stengthening;Right;Left;10 reps;2 sets;Knee bent;Knee straight   with red theraband  ? Hip Abduction Stengthening;Right;Left;1 set;10 reps;Knee straight   red theraband  ? Hip Extension Stengthening;Right;Left;1 set;10 reps   red theraband  ? Forward Step Up Right;Left;1 set;10 reps   ? Functional Squat 5 reps;2 sets   ? Other Standing Knee Exercises stepping up onto step and lifting other leg up to 90 degrees hip flexion with min HHA for balance   ? ?  ?  ? ?  ? ? ? ? ? ? Balance Exercises - 01/04/22 0001   ? ?  ? Balance Exercises: Standing  ? Tandem Stance Eyes open;2 reps;30 secs   ? SLS Eyes open;Solid surface;1 rep   1 rep on each foot performed  ? ?  ?  ? ?  ? ? ?Access Code: [email or web address] ?URL: [email or web address] ?Date: 01/04/2022 ?Prepared by: Maebelle Munroe ? ?Exercises ?Standing Hip Flexion with Resistance at Ankles and Counter Support - 1 x daily - 7 x weekly - 1 sets - 10 reps ?Standing Hip  Extension with Resistance at Ankles and Counter Support - 1 x daily - 7 x weekly - 1 sets - 10 reps ?Standing Hip Abduction with Resistance at Ankles and Counter Support - 1 x daily - 7 x weekly - 1 sets - 10 reps ?Hip and Knee Flexion with Anchored Resistance - 1 x daily - 7 x weekly - 1 sets - 10 reps ?Standing Heel Raise with Support - 1 x daily - 7 x weekly - 2 sets - 10 reps ? ? ? PT Education - 01/04/22 1925   ? ? Education Details Medbridge HEP   ? Person(s) Educated Patient   ? Methods Explanation;Demonstration;Handout   ? Comprehension Verbalized understanding;Returned demonstration   ? ?  ?  ? ?  ? ? ? PT Short Term Goals - 01/04/22 1928   ? ?  ? PT SHORT TERM GOAL #1  ? Title Patient will be independent in HEP to improve functional outcomes   ? Time 2   ? Period Weeks   ? Status New   ? Target Date 01/13/22   ? ?  ?  ? ?  ? ? ? ? PT Long Term Goals - 01/04/22 1134   ? ?  ? PT LONG TERM GOAL #1  ? Title Demonstrate improved balance per [employer] Test score  52/56   ? Baseline 48/56   ? Time 3   ? Period Weeks   ? Status New   ? Target Date 01/20/22   ?  ? PT LONG TERM GOAL #2  ? Title Report/demonstrate improved endurance as evidenced by ability to ambulate x 15 min without fatigue   ? Baseline 5-10 min self-report   ? Time 3   ? Period Weeks   ? Status New   ? Target Date 01/20/22   ? ?  ?  ? ?  ? ? ? ? ? ? ? ? Plan - 01/04/22 1925   ? ? Clinical Impression Statement Pt reported fatigue in legs with PRE's with use of red theraband and also reported some discomfort in Lt lower calf with unilateral heel raise.  Pt required short seated rest periods during session.  Cont with POC.   ? Personal Factors and Comorbidities Comorbidity 2   ? Comorbidities COPD, osteoporosis   ? Examination-Activity Limitations Stairs;Locomotion Level   ? Examination-Participation Restrictions Community Activity   ? Stability/Clinical Decision Making Stable/Uncomplicated   ? Rehab Potential Excellent   ? PT Frequency 1x / week   ? PT Duration 3 weeks   ? PT Treatment/Interventions ADLs/Self Care Home Management;DME Instruction;Gait training;Functional mobility training;Therapeutic activities;Therapeutic exercise;Balance training;Neuromuscular re-education   ? PT Next Visit Plan check HEP - endurance & strengthening   ? PT Home Exercise Plan tandem and single leg balance at counter   ? Consulted and Agree with Plan of Care Patient   ? ?  ?  ? ?  ? ? ?Patient will benefit from skilled therapeutic intervention in order to improve the following deficits and impairments:  Decreased activity tolerance, Decreased endurance, Decreased balance ? ?Visit Diagnosis: ?Muscle weakness (generalized) ? ?Unsteadiness on feet ? ? ? ? ?Problem List ?Patient Active Problem List  ? Diagnosis Date Noted  ? Hyperglycemia 12/20/2021  ? Hypokalemia 12/20/2021  ? Debility 12/18/2021  ? Pressure injury of skin 12/08/2021  ? Malnutrition of moderate degree 12/01/2021  ? Takotsubo cardiomyopathy 11/30/2021  ? Tobacco  abuse 11/29/2021  ? Risk for falls 11/29/2021  ? Protein-calorie malnutrition, severe 11/29/2021  ?  Septic shock (HCC) 11/28/2021  ? CAP (community acquired pneumonia)   ? Weakness   ? Acute respiratory failure (HCC)   ? Elevated LFTs   ? ? ?WPVXYI, AXKPV VZSMOLM, PT ?01/04/2022, 7:32 PM ? ?[place] ?[facility] ?[address], [address] ?[place], [place], [address] ?Phone: [phone]   Fax:  [phone] ? ?Name: Gina Cherry ?MRN: [id number] ?Date of Birth: 06/30/1956 ? ? ? ?

## 2022-01-13 DIAGNOSIS — Z1231 Encounter for screening mammogram for malignant neoplasm of breast: Secondary | ICD-10-CM | POA: Diagnosis not present

## 2022-01-18 ENCOUNTER — Ambulatory Visit: Payer: Medicare HMO | Attending: Physical Medicine and Rehabilitation | Admitting: Physical Therapy

## 2022-01-18 DIAGNOSIS — R262 Difficulty in walking, not elsewhere classified: Secondary | ICD-10-CM | POA: Insufficient documentation

## 2022-01-18 DIAGNOSIS — M6281 Muscle weakness (generalized): Secondary | ICD-10-CM | POA: Insufficient documentation

## 2022-01-18 DIAGNOSIS — R2681 Unsteadiness on feet: Secondary | ICD-10-CM | POA: Insufficient documentation

## 2022-01-18 NOTE — Therapy (Signed)
[place] ?[facility] ?[address], [address] ?[place], [place], [address] ?Phone: [phone]   Fax:  [phone] ? ?Physical Therapy Treatment & Discharge Summary ? ?Patient Details  ?Name: Gina Cherry ?MRN: [id number] ?Date of Birth: 11-07-1955 ?Referring Provider (PT): Love, Ivan Anchors, PA-C ? ? ?Encounter Date: 01/18/2022 ? ? PT End of Session - 01/18/22 1554   ? ? Visit Number 3   ? Number of Visits 3   ? Date for PT Re-Evaluation 01/27/22   ? Authorization Type Aetna Medicare 2023   ? Authorization Time Period auth not required, no VL   ? PT Start Time 1100   ? PT Stop Time 3559   ? PT Time Calculation (min) 34 min   ? Activity Tolerance Patient tolerated treatment well   ? Behavior During Therapy [facility] for tasks assessed/performed   ? ?  ?  ? ?  ? ? ?Past Medical History:  ?Diagnosis Date  ? COPD (chronic obstructive pulmonary disease) ([place])   ? Osteoporosis   ? Underweight   ? ? ?No past surgical history on file. ? ?There were no vitals filed for this visit. ? ? Subjective Assessment - 01/18/22 1101   ? ? Subjective Pt reports she has been doing exercises at home - feels ready for discharge today   ? Limitations Walking   ? How long can you walk comfortably? 5-10 min   ? Currently in Pain? No/denies   ? ?  ?  ? ?  ? ? ? ? ? [facility] PT Assessment - 01/18/22 1117   ? ?  ? Berg Balance Test  ? Sit to Stand Able to stand without using hands and stabilize independently   ? Standing Unsupported Able to stand safely 2 minutes   ? Sitting with Back Unsupported but Feet Supported on Floor or Stool Able to sit safely and securely 2 minutes   ? Stand to Sit Sits safely with minimal use of hands   ? Transfers Able to transfer safely, minor use of hands   ? Standing Unsupported with Eyes Closed Able to stand 10 seconds safely   ? Standing Unsupported with Feet Together Able to place feet together independently and stand 1 minute safely   ? From Standing, Reach Forward with Outstretched Arm Can  reach confidently >25 cm (10")   ? From Standing Position, Pick up Object from [place] to pick up shoe safely and easily   ? From Standing Position, Turn to Look Behind Over each Shoulder Looks behind from both sides and weight shifts well   ? Turn 360 Degrees Able to turn 360 degrees safely in 4 seconds or less   ? Standing Unsupported, Alternately Place Feet on Step/Stool Able to stand independently and safely and complete 8 steps in 20 seconds   ? Standing Unsupported, One Foot in Front Able to plae foot ahead of the other independently and hold 30 seconds   ? Standing on One Leg Able to lift leg independently and hold 5-10 seconds   ? Total Score 54   ? ?  ?  ? ?  ? ? ? ? ?Self Care; Discussed LTG's and progress towards goals; pt reports she is not having difficulty with any mobility related task at home- ?Feels she is ready for discharge ? ? ? ? ? ? ? ?TherEx: ? ?Access Code: [email or web address] ?URL: [email or web address] ?Date: 01/18/2022 ?Prepared by: Ethelene Browns ? ?Exercises ?- Standing Press photographer at [employer]  -  1 x daily - 7 x weekly - 1 sets - 1-2 reps - 20-30 hold ?- Standing Bilateral Gastroc Stretch with Step  - 1 x daily - 7 x weekly - 1 sets - 1-2 reps - 20-30 hold ? [place] Adult PT Treatment/Exercise - 01/18/22 0001   ? ?  ? Transfers  ? Transfers Sit to Stand   ? Number of Reps Other reps (comment)   3 reps  ?  ? Knee/Hip Exercises: Stretches  ? Active Hamstring Stretch Right;Left;1 rep;30 seconds   Runner's stretch  ? Gastroc Stretch Right;Left;1 rep;30 seconds   using 2" block on top of // floor  ?  ? Knee/Hip Exercises: Standing  ? Heel Raises Both;1 set;10 reps   unilateral heel raise - 10 reps each leg  ? ?  ?  ? ?  ? ? ? ? ? ? ? ? ? ? PT Education - 01/18/22 1554   ? ? Education Details Medbridge [phone] - hamstring & heel cord stretches   ? Person(s) Educated Patient   ? Methods Explanation;Demonstration;Handout   ? Comprehension Verbalized understanding;Returned demonstration    ? ?  ?  ? ?  ? ? ? PT Short Term Goals - 01/18/22 1555   ? ?  ? PT SHORT TERM GOAL #1  ? Title Patient will be independent in HEP to improve functional outcomes   ? Time 2   ? Period Weeks   ? Status Achieved   ? Target Date 01/13/22   ? ?  ?  ? ?  ? ? ? ? PT Long Term Goals - 01/18/22 1108   ? ?  ? PT LONG TERM GOAL #1  ? Title Demonstrate improved balance per [employer] Test score 52/56   ? Baseline 48/56; score 54/56 on 01-18-22   ? Time 3   ? Period Weeks   ? Status Achieved   ? Target Date 01/20/22   ?  ? PT LONG TERM GOAL #2  ? Title Report/demonstrate improved endurance as evidenced by ability to ambulate x 15 min without fatigue   ? Baseline 5-10 min self-report; pt reports amb. 10" nonstop - has not amb. 15" due to busy schedule/ weather but pt states she could amb. for this duration without difficulty   ? Time 3   ? Period Weeks   ? Status Achieved   ? Target Date 01/20/22   ? ?  ?  ? ?  ? ? ? ? ? ? ? ? Plan - 01/18/22 1559   ? ? Clinical Impression Statement Pt has met LTG's #1-2; Berg score has increased from 48/56 to 54/56.  Pt states she is pleased with progress and is ready for discharge.   ? Personal Factors and Comorbidities Comorbidity 2   ? Comorbidities COPD, osteoporosis   ? Examination-Activity Limitations Stairs;Locomotion Level   ? Examination-Participation Restrictions Community Activity   ? Stability/Clinical Decision Making Stable/Uncomplicated   ? Rehab Potential Excellent   ? PT Frequency 1x / week   ? PT Duration 3 weeks   ? PT Treatment/Interventions ADLs/Self Care Home Management;DME Instruction;Gait training;Functional mobility training;Therapeutic activities;Therapeutic exercise;Balance training;Neuromuscular re-education   ? PT Next Visit Plan D/C on 01-18-22   ? PT Home Exercise Plan tandem and single leg balance at counter   ? Consulted and Agree with Plan of Care Patient   ? ?  ?  ? ?  ? ? ?Patient will benefit from skilled therapeutic intervention in order to improve the  following deficits and impairments:  Decreased activity tolerance, Decreased endurance, Decreased balance ? ?Visit Diagnosis: ?Unsteadiness on feet ? ?Difficulty in walking, not elsewhere classified ? ?Muscle weakness (generalized) ? ? ? ? ?Problem List ?Patient Active Problem List  ? Diagnosis Date Noted  ? Hyperglycemia 12/20/2021  ? Hypokalemia 12/20/2021  ? Debility 12/18/2021  ? Pressure injury of skin 12/08/2021  ? Malnutrition of moderate degree 12/01/2021  ? Takotsubo cardiomyopathy 11/30/2021  ? Tobacco abuse 11/29/2021  ? Risk for falls 11/29/2021  ? Protein-calorie malnutrition, severe 11/29/2021  ? Septic shock ([place]) 11/28/2021  ? CAP (community acquired pneumonia)   ? Weakness   ? Acute respiratory failure ([place])   ? Elevated LFTs   ? ? ? ?PHYSICAL THERAPY DISCHARGE SUMMARY ? ?Visits from Start of Care: 3 ? ?Current functional level related to goals / functional outcomes: ?See above for progress towards goals  ?  ?Remaining deficits: ?Minimally decreased high level balance skills but does not seem to be interfering with function ?  ?Education / Equipment: ?Pt has been instructed in HEP for balance and LE strengthening  ? ?Patient agrees to discharge. Patient goals were met. Patient is being discharged due to meeting the stated rehab goals.  ? ?Alda Lea, PT ?01/18/2022, 4:00 PM ? ?[place] ?[facility] ?[address], [address] ?[place], [place], [address] ?Phone: [phone]   Fax:  [phone] ? ?Name: Gina Cherry ?MRN: [id number] ?Date of Birth: Feb 24, 1956 ? ? ? ?

## 2022-01-25 DIAGNOSIS — R922 Inconclusive mammogram: Secondary | ICD-10-CM | POA: Diagnosis not present

## 2022-01-25 DIAGNOSIS — R921 Mammographic calcification found on diagnostic imaging of breast: Secondary | ICD-10-CM | POA: Diagnosis not present

## 2022-01-25 DIAGNOSIS — R928 Other abnormal and inconclusive findings on diagnostic imaging of breast: Secondary | ICD-10-CM | POA: Diagnosis not present

## 2022-02-06 DIAGNOSIS — N6011 Diffuse cystic mastopathy of right breast: Secondary | ICD-10-CM | POA: Diagnosis not present

## 2022-02-06 DIAGNOSIS — R921 Mammographic calcification found on diagnostic imaging of breast: Secondary | ICD-10-CM | POA: Diagnosis not present

## 2022-05-09 ENCOUNTER — Encounter: Payer: Self-pay | Admitting: Physical Medicine and Rehabilitation

## 2022-05-09 ENCOUNTER — Encounter: Payer: Medicare HMO | Attending: Physical Medicine and Rehabilitation | Admitting: Physical Medicine and Rehabilitation

## 2022-05-09 VITALS — BP 182/95 | HR 88 | Ht 64.0 in | Wt 98.0 lb

## 2022-05-09 DIAGNOSIS — R5381 Other malaise: Secondary | ICD-10-CM | POA: Diagnosis not present

## 2022-05-09 DIAGNOSIS — J9601 Acute respiratory failure with hypoxia: Secondary | ICD-10-CM | POA: Diagnosis not present

## 2022-05-09 DIAGNOSIS — I5181 Takotsubo syndrome: Secondary | ICD-10-CM

## 2022-05-09 MED ORDER — METOPROLOL TARTRATE 25 MG PO TABS: 25.0000 mg | ORAL_TABLET | Freq: Two times a day (BID) | ORAL | 3 refills | Status: DC

## 2022-05-09 MED ORDER — SPIRONOLACTONE 25 MG PO TABS: 12.5000 mg | ORAL_TABLET | Freq: Every day | ORAL | 3 refills | Status: DC

## 2022-05-09 NOTE — Patient Instructions (Signed)
Foods for magnesium and potassium  HTN: -BP is 180/83 today.  -Advised checking BP daily at home and logging results to bring into follow-up appointment with PCP and myself. -Reviewed BP meds today.  -Advised regarding healthy foods that can help lower blood pressure and provided with a list: 1) citrus foods- high in vitamins and minerals 2) salmon and other fatty fish - reduces inflammation and oxylipins 3) swiss chard (leafy green)- high level of nitrates 4) pumpkin seeds- one of the best natural sources of magnesium 5) Beans and lentils- high in fiber, magnesium, and potassium 6) Berries- high in flavonoids 7) Amaranth (whole grain, can be cooked similarly to rice and oats)- high in magnesium and fiber 8) Pistachios- even more effective at reducing BP than other nuts 9) Carrots- high in phenolic compounds that relax blood vessels and reduce inflammation 10) Celery- contain phthalides that relax tissues of arterial walls 11) Tomatoes- can also improve cholesterol and reduce risk of heart disease 12) Broccoli- good source of magnesium, calcium, and potassium 13) Greek yogurt: high in potassium and calcium 14) Herbs and spices: Celery seed, cilantro, saffron, lemongrass, black cumin, ginseng, cinnamon, cardamom, sweet basil, and ginger 15) Chia and flax seeds- also help to lower cholesterol and blood sugar 16) Beets- high levels of nitrates that relax blood vessels  17) spinach and bananas- high in potassium  -Provided lise of supplements that can help with hypertension:  1) magnesium: one high quality brand is Bioptemizers since it contains all 7 types of magnesium, otherwise over the counter magnesium gluconate 400mg  is a good option 2) B vitamins 3) vitamin D 4) potassium 5) CoQ10 6) L-arginine 7) Vitamin C 8) Beetroot -Educated that goal BP is 120/80. -Made goal to incorporate some of the above foods into diet.

## 2022-05-09 NOTE — Progress Notes (Signed)
Subjective:    Patient ID: Gina Cherry, female    DOB: 1956-06-08, 66 y.o.   MRN: [id number]  HPI Gina Cherry is a 66 year old woman who presents for hospital follow-up after CIR admission for debility  1) Cardiopulmonary Debility -she has elevated blood pressure today -has not been checking blood pressure at home -denies shortness of breath or other symptoms -denies lower extremity edema -has not had cardiology follow-up  2)  HTN -she remembers we discussed she was low in potassium and magnesium in the hospital -she asks about supplementation  3) Active smoker -says she smokes a few cigarettes per day -has patches available to her at home  Pain Inventory Average Pain 0 Pain Right Now 0 My pain is  no pain  In the last 24 hours, has pain interfered with the following? General activity 0 Relation with others 0 Enjoyment of life 0 What TIME of day is your pain at its worst? varies Sleep (in general) Good  Pain is worse with:  no pain Pain improves with:  no pain Relief from Meds:  on no meds  walk without assistance  retired  No problems in this area  Any changes since last visit?  no  Any changes since last visit?  no    History reviewed. No pertinent family history. Social History   Socioeconomic History   Marital status: Single    Spouse name: Not on file   Number of children: Not on file   Years of education: Not on file   Highest education level: Not on file  Occupational History   Not on file  Tobacco Use   Smoking status: Every Day    Packs/day: 0.50    Types: Cigarettes   Smokeless tobacco: Not on file  Vaping Use   Vaping Use: Never used  Substance and Sexual Activity   Alcohol use: Yes    Comment: couple glasses of wine a day   Drug use: Never   Sexual activity: Not on file  Other Topics Concern   Not on file  Social History Narrative   Not on file   Social Determinants of Health   Financial Resource Strain: Not on  file  Food Insecurity: Not on file  Transportation Needs: Not on file  Physical Activity: Not on file  Stress: Not on file  Social Connections: Not on file   History reviewed. No pertinent surgical history. Past Medical History:  Diagnosis Date   COPD (chronic obstructive pulmonary disease) (HCC)    Osteoporosis    Underweight    BP (!) 180/83   Pulse 88   Ht 5\' 4"  (1.626 m)   Wt 98 lb (44.5 kg)   SpO2 98%   BMI 16.82 kg/m   Opioid Risk Score:   Fall Risk Score:  `1  Depression screen [facility] 2/9     05/09/2022    1:09 PM  Depression screen PHQ 2/9  Down, Depressed, Hopeless 0  PHQ - 2 Score 0  Altered sleeping 1  Tired, decreased energy 0  Change in appetite 0  Feeling bad or failure about yourself  0  Trouble concentrating 0  Moving slowly or fidgety/restless 0  Suicidal thoughts 0  PHQ-9 Score 1     Review of Systems  Constitutional: Negative.   HENT: Negative.    Eyes: Negative.   Respiratory: Negative.    Cardiovascular: Negative.   Gastrointestinal: Negative.   Endocrine: Negative.   Genitourinary: Negative.   Musculoskeletal: Negative.  Skin: Negative.   Allergic/Immunologic: Negative.   Neurological: Negative.   Hematological: Negative.   Psychiatric/Behavioral: Negative.        Objective:   Physical Exam  Gen: no distress, normal appearing, BMI 16.82 HEENT: oral mucosa pink and moist, NCAT Cardio: Reg rate Chest: normal effort, normal rate of breathing Abd: soft, non-distended Ext: no edema Psych: pleasant, normal affect Skin: intact Neuro: Alert and oriented x3 Musculoskeletal: Ambulating normally      Assessment & Plan:   1) Cardiac debility -provided referral to cardiology -reviewed hospital notes with her -discussed EF while she was hospitalized -discussed current mobility, lack of symptoms, and goals  2) HTN: -BP is 182/95today.  -refilled lopressor and spironolactone and discussed how she should restart these medications  depending on her home blood pressures.  -Advised checking BP daily at home and logging results to bring into follow-up appointment with PCP and myself. -Reviewed BP meds today.  -Advised regarding healthy foods that can help lower blood pressure and provided with a list: 1) citrus foods- high in vitamins and minerals 2) salmon and other fatty fish - reduces inflammation and oxylipins 3) swiss chard (leafy green)- high level of nitrates 4) pumpkin seeds- one of the best natural sources of magnesium 5) Beans and lentils- high in fiber, magnesium, and potassium 6) Berries- high in flavonoids 7) Amaranth (whole grain, can be cooked similarly to rice and oats)- high in magnesium and fiber 8) Pistachios- even more effective at reducing BP than other nuts 9) Carrots- high in phenolic compounds that relax blood vessels and reduce inflammation 10) Celery- contain phthalides that relax tissues of arterial walls 11) Tomatoes- can also improve cholesterol and reduce risk of heart disease 12) Broccoli- good source of magnesium, calcium, and potassium 13) Greek yogurt: high in potassium and calcium 14) Herbs and spices: Celery seed, cilantro, saffron, lemongrass, black cumin, ginseng, cinnamon, cardamom, sweet basil, and ginger 15) Chia and flax seeds- also help to lower cholesterol and blood sugar 16) Beets- high levels of nitrates that relax blood vessels  17) spinach and bananas- high in potassium  -Provided lise of supplements that can help with hypertension:  1) magnesium: one high quality brand is Bioptemizers since it contains all 7 types of magnesium, otherwise over the counter magnesium gluconate 400mg  is a good option 2) B vitamins 3) vitamin D 4) potassium 5) CoQ10 6) L-arginine 7) Vitamin C 8) Beetroot -Educated that goal BP is 120/80. -Made goal to incorporate some of the above foods into diet.

## 2022-06-15 ENCOUNTER — Encounter: Payer: Self-pay | Admitting: *Deleted

## 2022-06-15 ENCOUNTER — Telehealth: Payer: Self-pay | Admitting: *Deleted

## 2022-06-15 NOTE — Patient Outreach (Signed)
  Care Coordination   Initial Visit Note   06/15/2022 Name: Gina Cherry MRN: [id number] DOB: 1955/10/28  Gina Cherry is a 66 y.o. year old female who sees Gina Helling, MD for primary care. I spoke with  Gina Cherry by phone today.  What matters to the patients health and wellness today?  "I'm doing well and want to continue"    Goals Addressed               This Visit's Progress     COMPLETED: "I'm doing well and want to continue" (pt-stated)        Care Coordination Interventions: Patient interviewed about adult health maintenance status including  making appointment to see primary care provider and complete Annual Wellness Visit (pt states she has  not seen primary care provider in over a year, states she will consider getting Annual Wellness Visit) Provided education about importance of taking all medications as prescribed (pt states she has all medications and able to afford) Reviewed importance of continuing to follow up with cardiologist for optimal outcomes CAD Reviewed [facility] care coordination program, patient agreeable to today's outreach but declined any further outreach.          SDOH assessments and interventions completed:  Yes  SDOH Interventions Today    Flowsheet Row Most Recent Value  SDOH Interventions   Food Insecurity Interventions Intervention Not Indicated  Transportation Interventions Intervention Not Indicated        Care Coordination Interventions Activated:  Yes  Care Coordination Interventions:  Yes, provided   Follow up plan: No further intervention required.   Encounter Outcome:  Pt. Visit Completed   Gina Cherry [facility], BSN [facility] RN Care Coordinator [phone]

## 2022-06-16 ENCOUNTER — Ambulatory Visit: Payer: Medicare HMO | Attending: Interventional Cardiology | Admitting: Interventional Cardiology

## 2022-06-16 ENCOUNTER — Encounter: Payer: Self-pay | Admitting: Interventional Cardiology

## 2022-06-16 VITALS — BP 150/76 | HR 83 | Ht 64.0 in | Wt 100.6 lb

## 2022-06-16 DIAGNOSIS — Z720 Tobacco use: Secondary | ICD-10-CM | POA: Diagnosis not present

## 2022-06-16 DIAGNOSIS — I10 Essential (primary) hypertension: Secondary | ICD-10-CM

## 2022-06-16 DIAGNOSIS — I428 Other cardiomyopathies: Secondary | ICD-10-CM | POA: Diagnosis not present

## 2022-06-16 NOTE — Patient Instructions (Addendum)
Medication Instructions:  Your physician recommends that you continue on your current medications as directed. Please refer to the Current Medication list given to you today.  *If you need a refill on your cardiac medications before your next appointment, please call your pharmacy*   Lab Work: none If you have labs (blood work) drawn today and your tests are completely normal, you will receive your results only by: MyChart Message (if you have MyChart) OR A paper copy in the mail If you have any lab test that is abnormal or we need to change your treatment, we will call you to review the results.   Testing/Procedures: none   Follow-Up: At [facility], you and your health needs are our priority.  As part of our continuing mission to provide you with exceptional heart care, we have created designated Provider Care Teams.  These Care Teams include your primary Cardiologist (physician) and Advanced Practice Providers (APPs -  Physician Assistants and Nurse Practitioners) who all work together to provide you with the care you need, when you need it.  We recommend signing up for the patient portal called "MyChart".  Sign up information is provided on this After Visit Summary.  MyChart is used to connect with patients for Virtual Visits (Telemedicine).  Patients are able to view lab/test results, encounter notes, upcoming appointments, etc.  Non-urgent messages can be sent to your provider as well.   To learn more about what you can do with MyChart, go to [email or web address].    Your next appointment:   12 month(s)  The format for your next appointment:   In Person  Provider:   Lance Muss, MD     Other Instructions Please schedule patient for nurse room visit in next week or so. Bring your home blood pressure cuff to this appointment  Important Information About Sugar

## 2022-06-16 NOTE — Progress Notes (Signed)
Cardiology Office Note   Date:  06/16/2022   ID:  Gina Cherry, DOB Jun 26, 1956, MRN [id number]  PCP:  Annamaria Helling, MD    No chief complaint on file.  NICM  Wt Readings from Last 3 Encounters:  06/16/22 100 lb 9.6 oz (45.6 kg)  05/09/22 98 lb (44.5 kg)  12/19/21 99 lb 13.9 oz (45.3 kg)       History of Present Illness: Gina Cherry is a 66 y.o. female who had acute systolic heart failure while in the hospital she.  She was managed by Dr. Gala Romney in February 2023.  She had pneumonia and sepsis.  Records show: "She was admitted to ICU and started on braod spectrum abx. Blood cx negative. Strep pneumo and legionella test pending.    As part of w/u, echo was obtained and showed moderately reduced LVEF 30-35%, w/ HK of the basal to mid segments of all walls of the LV + apical hyperkinesis. Pattern c/w possible atypical stress induced CM. RV normal. + small pericardial effusion. No tamponade.    There was concern for potential developing cardiogenic shock given patient condition during am rounds by primary team. Still hypoxic w/ more cool and mottled looking extremities on exam. Subsequently, CVC was placed to check Co-ox.    Co-ox normal 68%, CVP 6-7 "  Second Echo in 2/23: "Left ventricular ejection fraction, by estimation, is 70 to 75%. The  left ventricle has hyperdynamic function. The left ventricle has no  regional wall motion abnormalities. There is moderate left ventricular  hypertrophy.   2. Right ventricular systolic function is moderately reduced. The right  ventricular size is mildly enlarged. Moderately increased right  ventricular wall thickness. There is mildly elevated pulmonary artery  systolic pressure.   3. The mitral valve is normal in structure. No evidence of mitral valve  regurgitation.   4. The aortic valve is normal in structure. Aortic valve regurgitation is  not visualized. No aortic stenosis is present. "  EF improved as noted above.   She went to rehab.  She feels well.    Home Readings are in the 110s to 125 systolic, even on metoprolol 25 mg daily.   Denies : Chest pain. Dizziness. Leg edema. Nitroglycerin use. Orthopnea. Palpitations. Paroxysmal nocturnal dyspnea. Shortness of breath. Syncope.      Past Medical History:  Diagnosis Date   COPD (chronic obstructive pulmonary disease) (HCC)    Osteoporosis    Underweight     History reviewed. No pertinent surgical history.   Current Outpatient Medications  Medication Sig Dispense Refill   acetaminophen (TYLENOL) 500 MG tablet Take 500 mg by mouth every 6 (six) hours as needed for headache (pain).     aspirin 81 MG chewable tablet Chew 1 tablet (81 mg total) by mouth daily. 30 tablet 0   metoprolol tartrate (LOPRESSOR) 25 MG tablet Take 25 mg by mouth daily.     Multiple Vitamin (MULTIVITAMIN WITH MINERALS) TABS tablet Take 1 tablet by mouth at bedtime.     nicotine (NICODERM CQ - DOSED IN MG/24 HOURS) 21 mg/24hr patch Place 1 patch (21 mg total) onto the skin daily. 28 patch 0   Pramoxine-HC (HYDROCORTISONE ACE-PRAMOXINE) 2.5-1 % CREA Apply 1 application topically 2 (two) times daily as needed (hemorrhoids).     albuterol (VENTOLIN HFA) 108 (90 Base) MCG/ACT inhaler Inhale 2 puffs into the lungs every 6 (six) hours as needed for wheezing or shortness of breath. (Patient not taking: Reported on 06/16/2022)  8.5 g 2   fluticasone furoate-vilanterol (BREO ELLIPTA) 200-25 MCG/ACT AEPB Inhale 1 puff into the lungs daily. (Patient not taking: Reported on 06/16/2022) 60 each 0   spironolactone (ALDACTONE) 25 MG tablet Take 0.5 tablets (12.5 mg total) by mouth daily. (Patient not taking: Reported on 06/16/2022) 180 tablet 3   umeclidinium bromide (INCRUSE ELLIPTA) 62.5 MCG/ACT AEPB Inhale 1 puff into the lungs daily. (Patient not taking: Reported on 06/16/2022) 30 each 1   No current facility-administered medications for this visit.    Allergies:   Codeine    Social  History:  The patient  reports that she has been smoking cigarettes. She has been smoking an average of .5 packs per day. She does not have any smokeless tobacco history on file. She reports current alcohol use. She reports that she does not use drugs.   Family History:  The patient's family history includes Heart disease in her father; Hypertension in her brother.    ROS:  Please see the history of present illness.   Otherwise, review of systems are positive for back pain.   All other systems are reviewed and negative.    PHYSICAL EXAM: VS:  BP (!) 168/82   Pulse 83   Ht 5\' 4"  (1.626 m)   Wt 100 lb 9.6 oz (45.6 kg)   SpO2 98%   BMI 17.27 kg/m  , BMI Body mass index is 17.27 kg/m. GEN: Well nourished, well developed, in no acute distress HEENT: normal Neck: no JVD, carotid bruits, or masses Cardiac: RRR; no murmurs, rubs, or gallops,no edema  Respiratory:  clear to auscultation bilaterally, normal work of breathing GI: soft, nontender, nondistended, + BS MS: no deformity or atrophy Skin: warm and dry, no rash Neuro:  Strength and sensation are intact Psych: euthymic mood, full affect   EKG:   The ekg ordered today demonstrates NSR, nonspecific ST changes   Recent Labs: 12/10/2021: B Natriuretic Peptide 694.9 12/13/2021: Magnesium 2.1 12/14/2021: Hemoglobin 9.2; Platelets 451 12/19/2021: ALT 56 12/21/2021: BUN 6; Creatinine, Ser 0.50; Potassium 3.5; Sodium 135   Lipid Panel    Component Value Date/Time   TRIG 100 12/10/2021 0421     Other studies Reviewed: Additional studies/ records that were reviewed today with results demonstrating: Hospital records reviewed.   ASSESSMENT AND PLAN:  NICM: improved in 2/23.  Follow-up echo showed resolution of wall motion abnormalities.  No symptoms of heart failure.  Cardiac cath has been considered when she was in the hospital.  Given her lack of symptoms and improvement in LVEF, would not plan for angiography at this time. Elevated BP  : home readings are well controlled.  In office blood pressures are higher.  My check after 10 minutes of sitting did show a lower blood pressure.  We will check home cuff through a nurse visit in the office.  Depending on the reading we get, if her blood pressure is in fact normal at home, she may not need much more in the way of medications.  For now, continue metoprolol.  No need to refill spironolactone at this point. Smoking cessation: She needs to stop smoking.   Current medicines are reviewed at length with the patient today.  The patient concerns regarding her medicines were addressed.  The following changes have been made:  No change  Labs/ tests ordered today include:  No orders of the defined types were placed in this encounter.   Recommend 150 minutes/week of aerobic exercise Low fat, low carb, high fiber  diet recommended  Disposition:   FU in 1 year   Signed, Larae Grooms, MD  06/16/2022 3:36 PM    [facility] [address], [place], [place]  [address] Phone: [phone]; Fax: [phone]

## 2022-06-23 ENCOUNTER — Ambulatory Visit: Payer: Medicare HMO | Attending: Cardiovascular Disease

## 2022-06-23 NOTE — Progress Notes (Signed)
See RN Visit note:  06/23/2022.

## 2022-06-23 NOTE — Patient Instructions (Signed)
   Nurse Visit   Date of Encounter: 06/23/2022 ID: Gina Cherry, DOB 1956/09/29, MRN [id number]  PCP:  Annamaria Helling, MD   [name] Cardiologist:  Lance Muss, MD      Visit Details   VS:  BP (!) 146/88 (BP Location: Left Arm, Patient Position: Sitting, Cuff Size: Normal)   Pulse (!) 105   Ht 5\' 4"  (1.626 m)   Wt 96 lb (43.5 kg)   SpO2 96%   BMI 16.48 kg/m  , BMI Body mass index is 16.48 kg/m.  Wt Readings from Last 3 Encounters:  06/23/22 96 lb (43.5 kg)  06/16/22 100 lb 9.6 oz (45.6 kg)  05/09/22 98 lb (44.5 kg)     Reason for visit: Blood pressure check; Home BP cuff, and readings vs. Clinic manual BP cuff and reading Performed today: Ht, Wt, BP, HR, O2 Changes (medications, testing, etc.) : Lopressor 25 mg, and Spironolactone 12.5 mg Length of Visit: 20 minutes    Medications Adjustments/Labs and Tests Ordered:  Pt nurse visit for BP reassessment, and to check home BP machine vs manual BP cuff in clinic.  Pt stated home blood pressures have improved, and in clinic BP reading of 138/90 and 146/88, has improved / comparing it to BP value of 06/16/2022.  Pt Bp check manual vs. Bp machine in clinic was 25-30 points higher ( systolic ) than the manual readings.  Pt advised to watch the systolic blood pressure numbers on her machine at home, and if they get to high, call [facility] for another RN visit to do a manual Bp check.  Pt stated takes Metoprolol Tartrate daily, but only takes Spironolactone "as needed" as ordered by her PCP, 08/16/2022, MD.  Even with improvement with her Bp reading / check, I will forward ths nurse visit note to Dr. Annamaria Helling and RN.   Pt home Bp log reviewed / assessed; Systolic Bp between 110's to mid 120's on average.  Diastolic Bp runs between 54 to 67, at home. Pt stated the clinic environment is a stressor that cause her Bp to be elevated. Pt encouraged to continue tracking home Bp readings, and share with provider at  next visit.            Signed, Eldridge Dace, RN  06/23/2022 3:37 PM

## 2022-07-31 DIAGNOSIS — R636 Underweight: Secondary | ICD-10-CM | POA: Diagnosis not present

## 2022-07-31 DIAGNOSIS — Z8249 Family history of ischemic heart disease and other diseases of the circulatory system: Secondary | ICD-10-CM | POA: Diagnosis not present

## 2022-07-31 DIAGNOSIS — Z9181 History of falling: Secondary | ICD-10-CM | POA: Diagnosis not present

## 2022-07-31 DIAGNOSIS — Z681 Body mass index (BMI) 19 or less, adult: Secondary | ICD-10-CM | POA: Diagnosis not present

## 2022-07-31 DIAGNOSIS — I10 Essential (primary) hypertension: Secondary | ICD-10-CM | POA: Diagnosis not present

## 2022-07-31 DIAGNOSIS — Z885 Allergy status to narcotic agent status: Secondary | ICD-10-CM | POA: Diagnosis not present

## 2022-07-31 DIAGNOSIS — R69 Illness, unspecified: Secondary | ICD-10-CM | POA: Diagnosis not present

## 2022-07-31 DIAGNOSIS — Z833 Family history of diabetes mellitus: Secondary | ICD-10-CM | POA: Diagnosis not present

## 2022-07-31 DIAGNOSIS — Z809 Family history of malignant neoplasm, unspecified: Secondary | ICD-10-CM | POA: Diagnosis not present

## 2022-07-31 DIAGNOSIS — M810 Age-related osteoporosis without current pathological fracture: Secondary | ICD-10-CM | POA: Diagnosis not present

## 2022-12-21 DIAGNOSIS — Z01419 Encounter for gynecological examination (general) (routine) without abnormal findings: Secondary | ICD-10-CM | POA: Diagnosis not present

## 2022-12-21 DIAGNOSIS — Z0000 Encounter for general adult medical examination without abnormal findings: Secondary | ICD-10-CM | POA: Diagnosis not present

## 2022-12-21 DIAGNOSIS — Z681 Body mass index (BMI) 19 or less, adult: Secondary | ICD-10-CM | POA: Diagnosis not present

## 2023-01-18 DIAGNOSIS — H524 Presbyopia: Secondary | ICD-10-CM | POA: Diagnosis not present

## 2023-01-26 DIAGNOSIS — Z780 Asymptomatic menopausal state: Secondary | ICD-10-CM | POA: Diagnosis not present

## 2023-01-26 DIAGNOSIS — Z1231 Encounter for screening mammogram for malignant neoplasm of breast: Secondary | ICD-10-CM | POA: Diagnosis not present

## 2023-01-26 DIAGNOSIS — M810 Age-related osteoporosis without current pathological fracture: Secondary | ICD-10-CM | POA: Diagnosis not present

## 2023-02-09 IMAGING — DX DG CHEST 1V PORT
1 series · 1 of 1 positions shown · non-contrast
Comparison: December 01, 21.

CLINICAL DATA: Diarrhea, cough, weakness.

EXAM:
PORTABLE CHEST 1 VIEW

[chest]
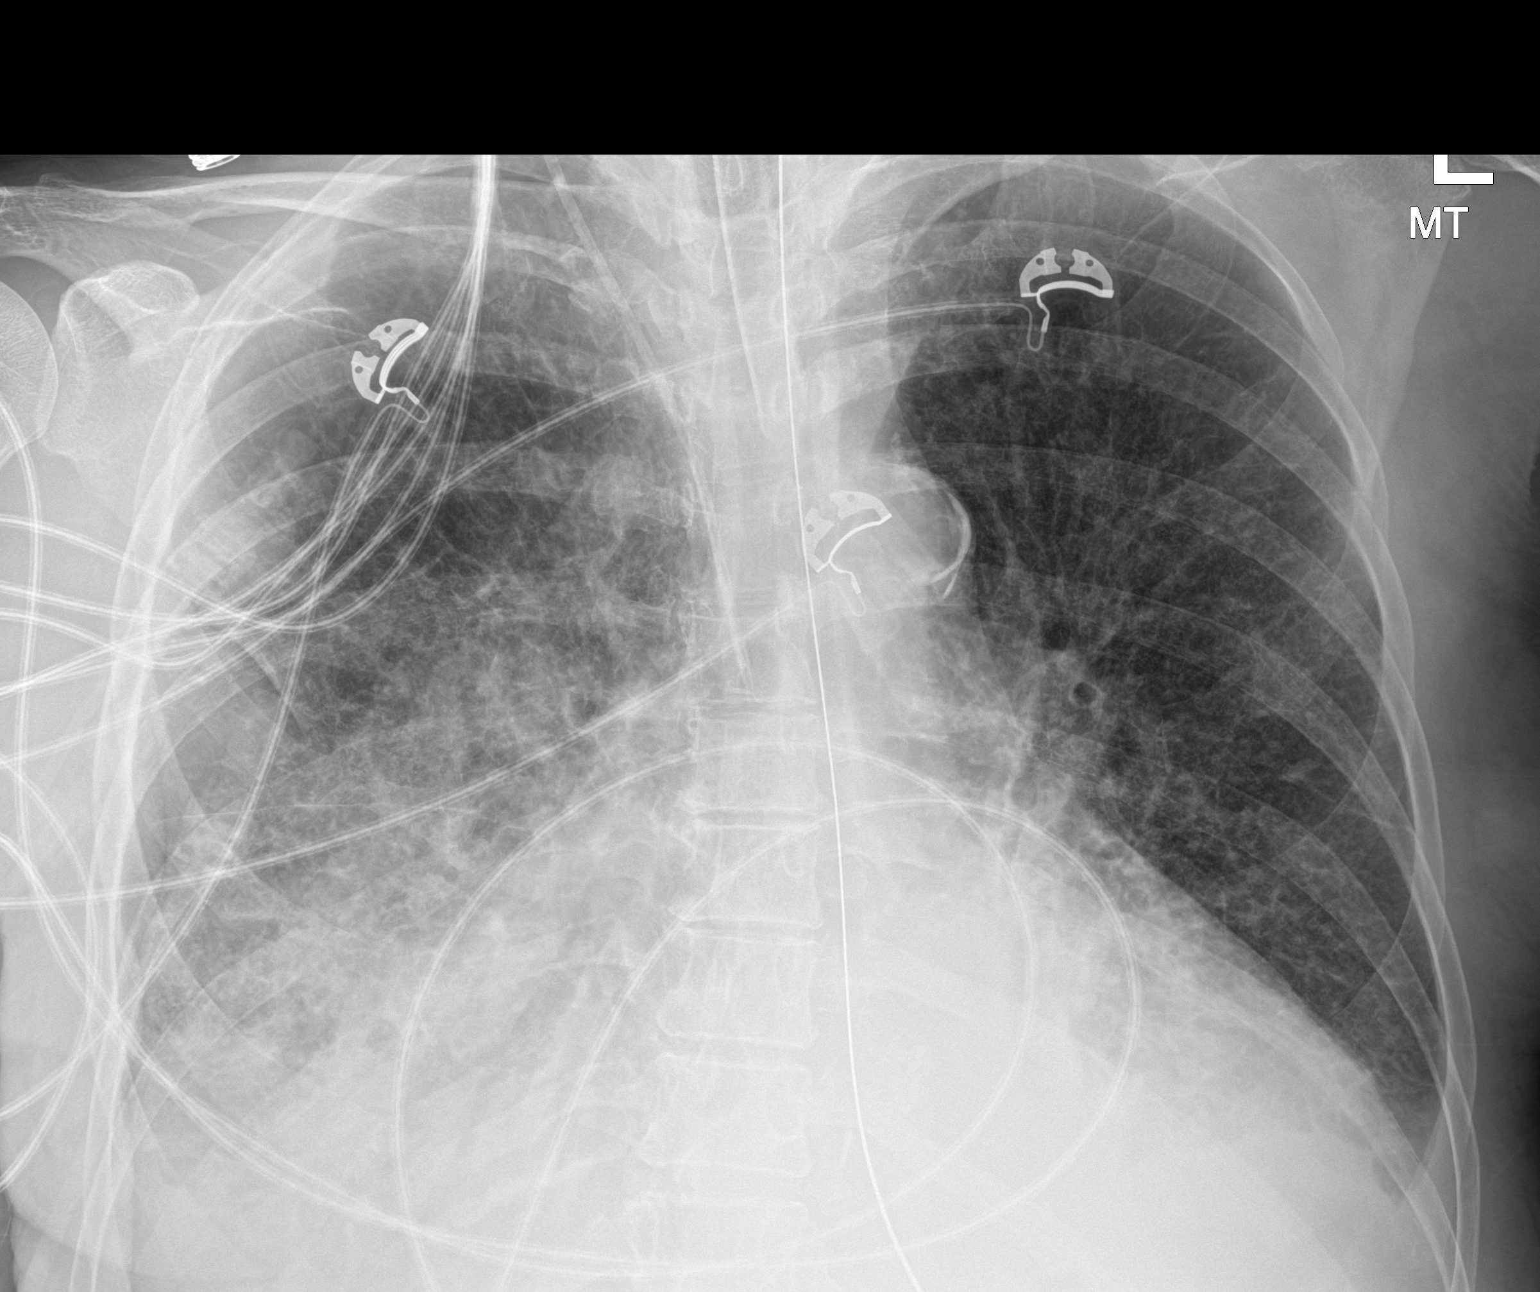

[1 of 1 positions shown; findings below may reference images not displayed]

FINDINGS: Increased retrocardiac opacity with otherwise similar patchy
interstitial airspace opacities in the right greater than left
lungs. Probable small left and moderate right layering pleural
effusions. No visible pneumothorax. Endotracheal tube tip is
approximately 4.5 cm above the carina. Right IJ central venous
catheter with the tip projecting in the lower SVC. Gastric tube
courses below the diaphragm in outside the field of view.
IMPRESSION: 1. Increased retrocardiac opacity with otherwise similar patchy
interstitial airspace opacities in the right greater than left
lungs. Findings could relate to asymmetric edema and/or multifocal
pneumonia.
2. Probable small left and moderate right layering pleural
effusions.

## 2023-02-12 DIAGNOSIS — H2513 Age-related nuclear cataract, bilateral: Secondary | ICD-10-CM | POA: Diagnosis not present

## 2023-02-12 DIAGNOSIS — H2512 Age-related nuclear cataract, left eye: Secondary | ICD-10-CM | POA: Diagnosis not present

## 2023-02-12 DIAGNOSIS — I10 Essential (primary) hypertension: Secondary | ICD-10-CM | POA: Diagnosis not present

## 2023-02-12 IMAGING — DX DG CHEST 1V PORT
1 series · 1 of 1 positions shown · non-contrast
Comparison: 12/03/2021; 12/01/2021

CLINICAL DATA: Respiratory failure with hypoxia.

EXAM:
PORTABLE CHEST 1 VIEW

[chest]
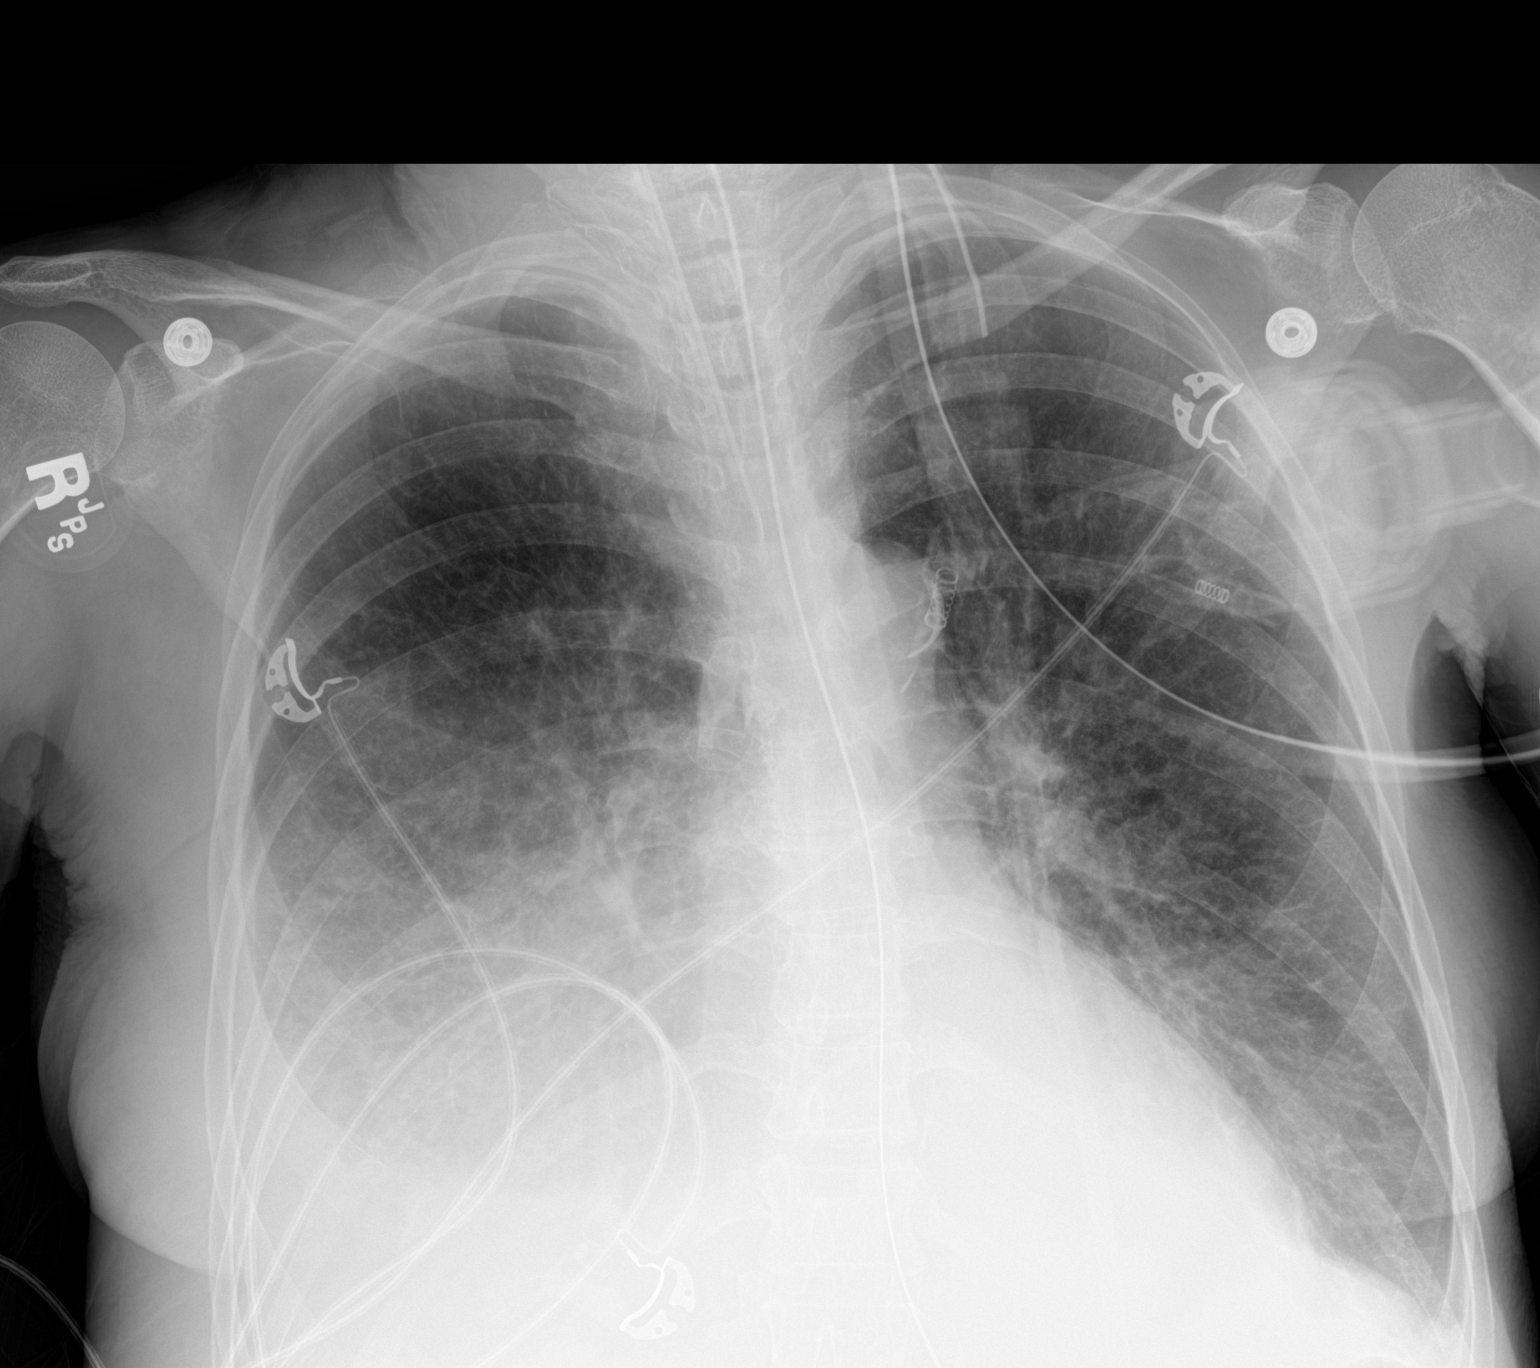

[1 of 1 positions shown; findings below may reference images not displayed]

FINDINGS: Grossly unchanged cardiac silhouette and mediastinal contours with
atherosclerotic plaque within thoracic aorta. Interval right jugular
approach central venous catheter. Otherwise, stable positioning
remaining support apparatus. No pneumothorax. The pulmonary
vasculature remains indistinct with cephalization flow. Unchanged
small to moderate-sized right-sided pleural effusion with associated
bibasilar heterogeneous/consolidative opacities, right greater than
left. No new focal airspace opacities. No acute osseous
abnormalities.
IMPRESSION: 1. Interval removal of right jugular approach central venous
catheter. Otherwise, stable positioning of remaining support
apparatus. No pneumothorax.
2. Otherwise similar findings of pulmonary edema, small to
moderate-sized right-sided effusion and associated bibasilar
heterogeneous/consolidative opacities, right greater than left,
atelectasis versus infiltrate.

## 2023-02-13 IMAGING — DX DG ABD PORTABLE 1V
1 series · 1 of 1 positions shown · non-contrast
Comparison: 12/01/2021

CLINICAL DATA: Feeding tube placement

EXAM:
PORTABLE ABDOMEN - 1 VIEW

[abdomen supine]
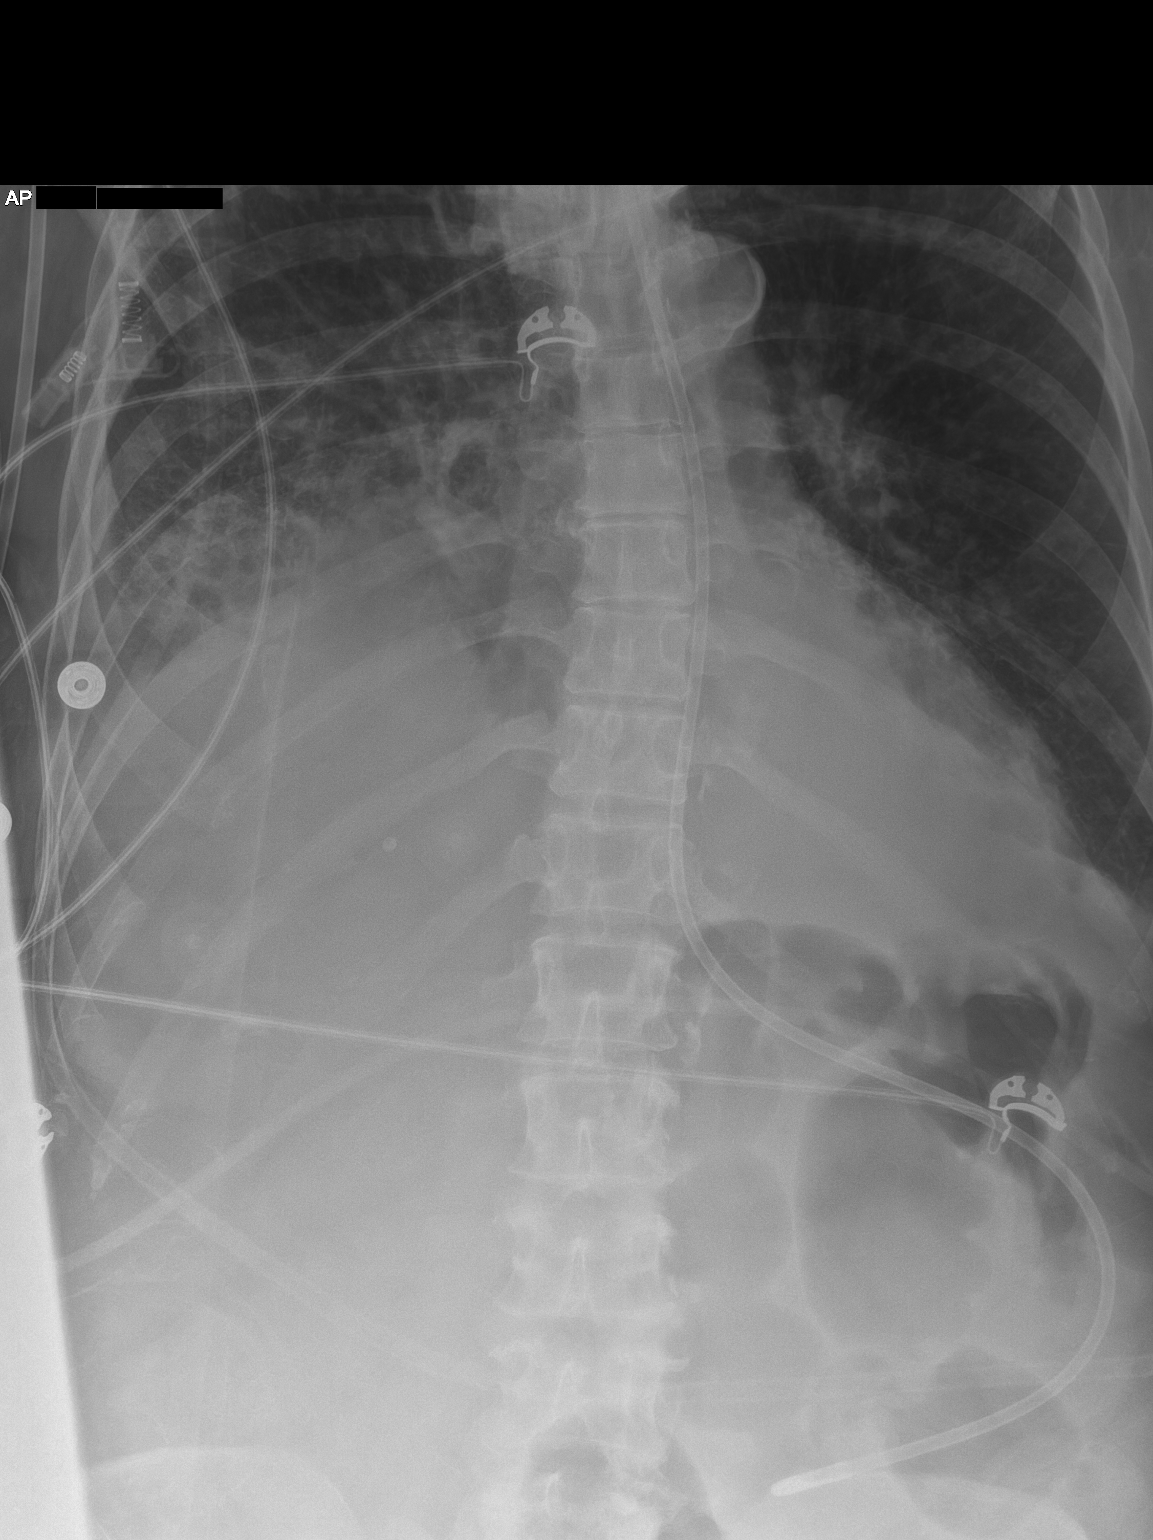

[1 of 1 positions shown; findings below may reference images not displayed]

FINDINGS: Feeding tube has been placed in the interval. This terminates in the
body of the stomach. Nasogastric tube has been removed.

Endotracheal tube terminates 3.5 cm above carina. Right greater than
left base airspace disease again identified. Cardiomegaly. No gross
free intraperitoneal air.
IMPRESSION: Feeding tube terminating at the gastric body.

## 2023-02-15 IMAGING — DX DG CHEST 1V PORT
1 series · 2 of 2 positions shown · non-contrast
Comparison: 12/06/2021, 12/03/2021, 12/01/2021, 11/30/2021

CLINICAL DATA: 65-year-old female with pulmonary edema.

EXAM:
PORTABLE CHEST - 1 VIEW

[Series 1: chest · 0.14mm/px · 2 of 2 slices shown]
[im 1/2]
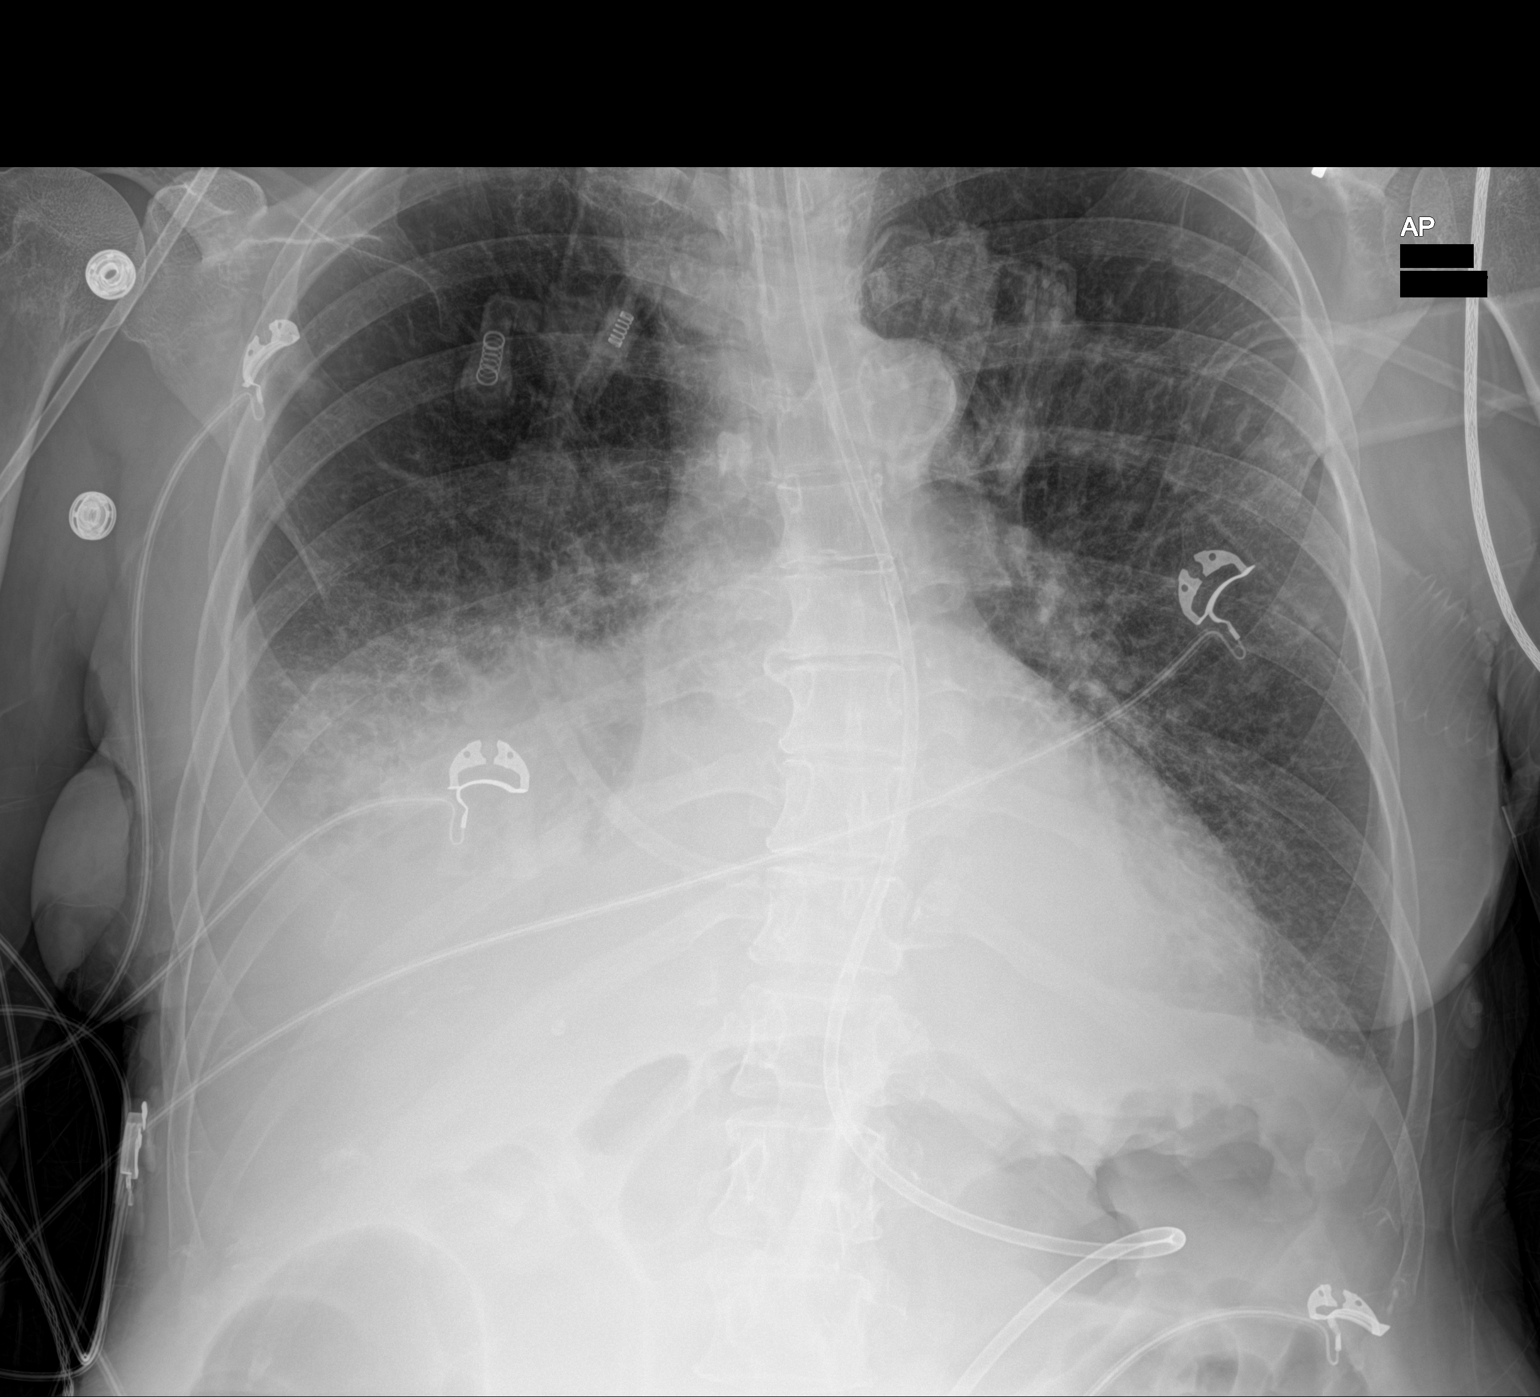
[im 2/2]
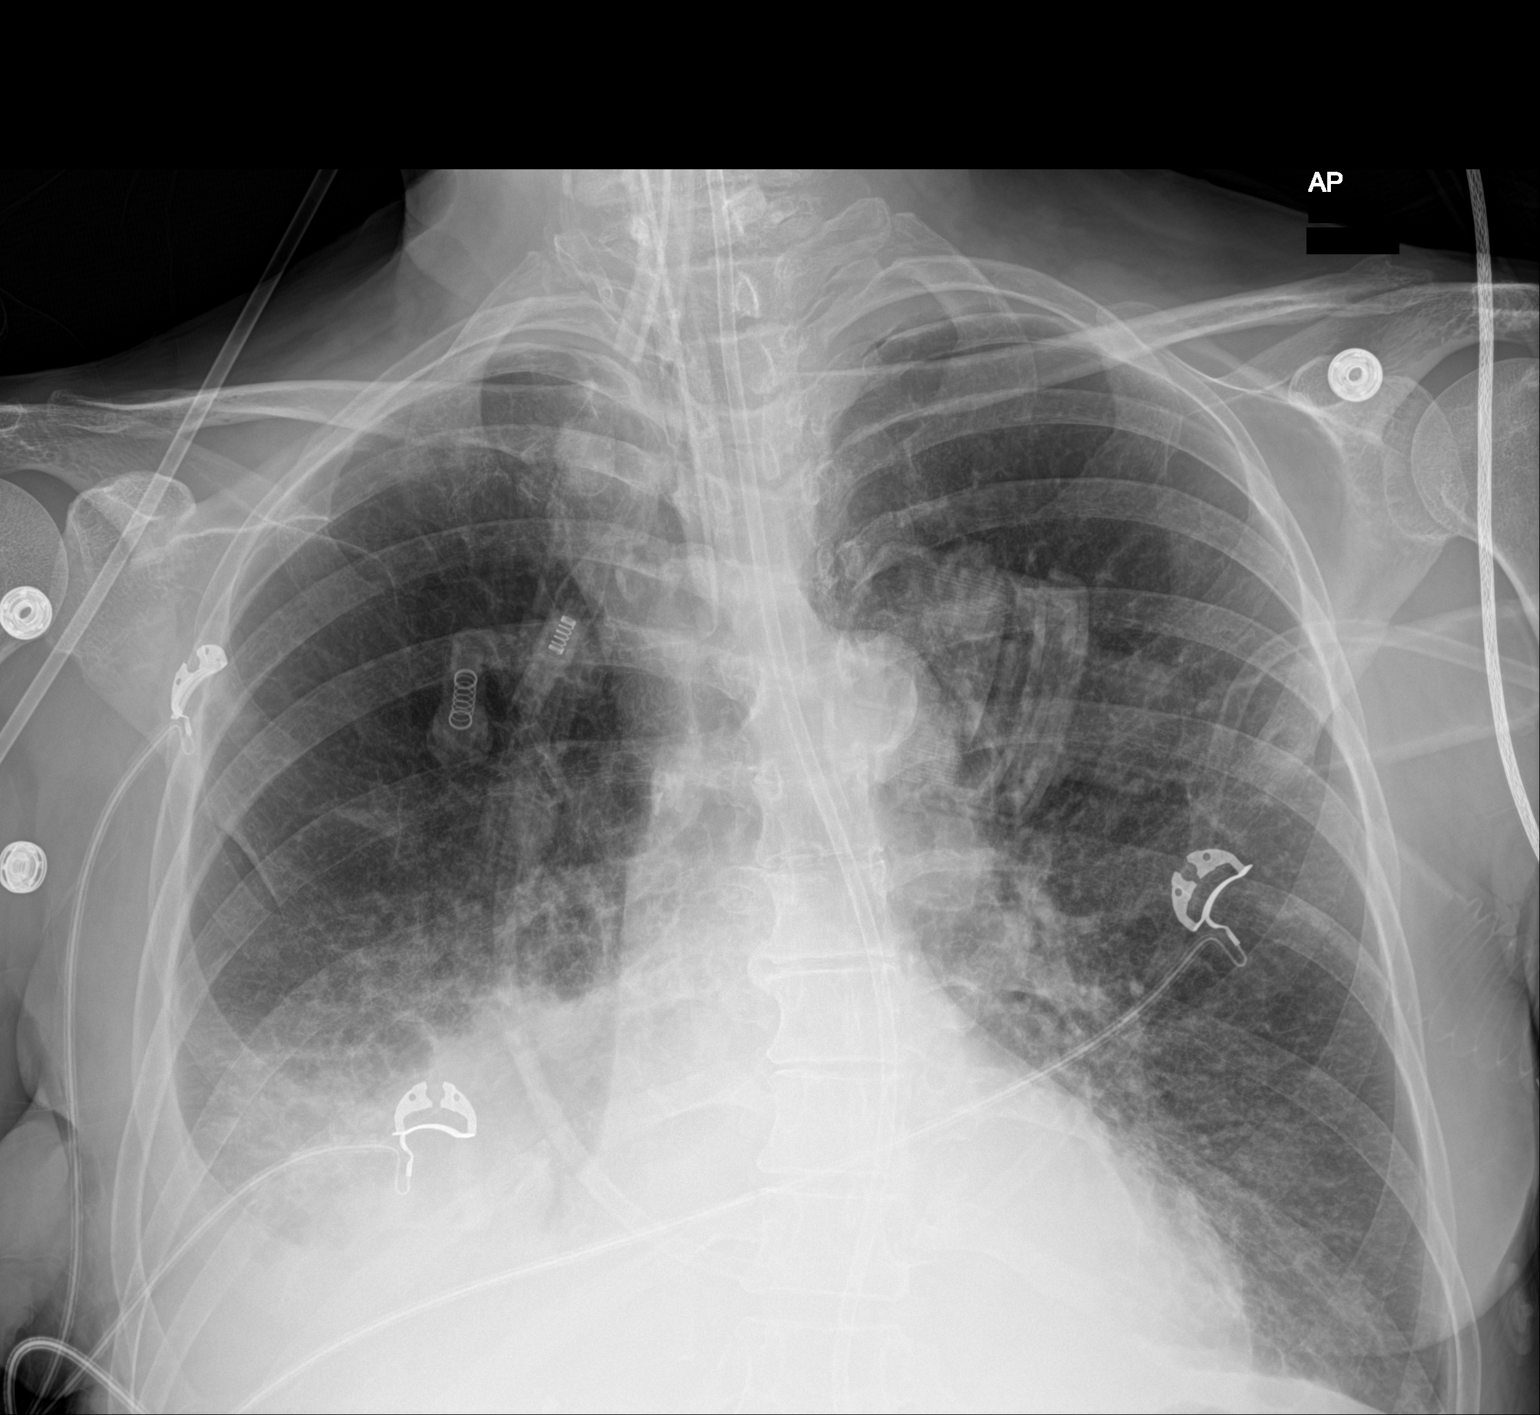

[2 of 2 positions shown; findings below may reference images not displayed]

FINDINGS: The mediastinal contours are unchanged. Similar appearing
atherosclerotic calcification of the aortic arch. Unchanged position
of indwelling endotracheal tube with the tip in the midthoracic
trachea. Enteric feeding tube terminates off the inferior aspect of
this image. Unchanged blunting of the right costophrenic angle. Hazy
bibasilar pulmonary opacities, right greater than left. Mild
cephalization of pulmonary vasculature. No acute osseous
abnormality.
IMPRESSION: 1. Similar position of indwelling endotracheal tube with the tip in
the midthoracic trachea.
2. Similar appearing mild pulmonary edema, small right pleural
effusion, and bibasilar subsegmental atelectasis.

## 2023-05-10 DIAGNOSIS — H2512 Age-related nuclear cataract, left eye: Secondary | ICD-10-CM | POA: Diagnosis not present

## 2023-05-11 DIAGNOSIS — H25041 Posterior subcapsular polar age-related cataract, right eye: Secondary | ICD-10-CM | POA: Diagnosis not present

## 2023-05-11 DIAGNOSIS — H2511 Age-related nuclear cataract, right eye: Secondary | ICD-10-CM | POA: Diagnosis not present

## 2023-05-11 DIAGNOSIS — H25011 Cortical age-related cataract, right eye: Secondary | ICD-10-CM | POA: Diagnosis not present

## 2023-05-24 DIAGNOSIS — H2511 Age-related nuclear cataract, right eye: Secondary | ICD-10-CM | POA: Diagnosis not present

## 2023-07-01 NOTE — Progress Notes (Unsigned)
Cardiology Office Note   Date:  07/02/2023   ID:  Gina Cherry, DOB 1956/03/22, MRN [id number]  PCP:  Annamaria Helling, MD    No chief complaint on file.  NICM  Wt Readings from Last 3 Encounters:  07/02/23 96 lb 12.8 oz (43.9 kg)  06/23/22 96 lb (43.5 kg)  06/16/22 100 lb 9.6 oz (45.6 kg)       History of Present Illness: Gina Cherry is a 67 y.o. female  who had acute systolic heart failure while in the hospital she.  She was managed by Dr. Gala Romney in February 2023.  She had pneumonia and sepsis.  Records show: "She was admitted to ICU and started on braod spectrum abx. Blood cx negative. Strep pneumo and legionella test pending.    As part of w/u, echo was obtained and showed moderately reduced LVEF 30-35%, w/ HK of the basal to mid segments of all walls of the LV + apical hyperkinesis. Pattern c/w possible atypical stress induced CM. RV normal. + small pericardial effusion. No tamponade.    There was concern for potential developing cardiogenic shock given patient condition during am rounds by primary team. Still hypoxic w/ more cool and mottled looking extremities on exam. Subsequently, CVC was placed to check Co-ox.    Co-ox normal 68%, CVP 6-7 "   Second Echo in 2/23: "Left ventricular ejection fraction, by estimation, is 70 to 75%. The  left ventricle has hyperdynamic function. The left ventricle has no  regional wall motion abnormalities. There is moderate left ventricular  hypertrophy.   2. Right ventricular systolic function is moderately reduced. The right  ventricular size is mildly enlarged. Moderately increased right  ventricular wall thickness. There is mildly elevated pulmonary artery  systolic pressure.   3. The mitral valve is normal in structure. No evidence of mitral valve  regurgitation.   4. The aortic valve is normal in structure. Aortic valve regurgitation is  not visualized. No aortic stenosis is present. "   EF improved as noted  above.  She went to rehab.   In the past, home readings have been normal.  Office blood pressures have been elevated.  Denies : Chest pain. Dizziness. Leg edema. Nitroglycerin use. Orthopnea. Palpitations. Paroxysmal nocturnal dyspnea. Shortness of breath. Syncope.      Past Medical History:  Diagnosis Date   COPD (chronic obstructive pulmonary disease) (HCC)    Osteoporosis    Underweight     History reviewed. No pertinent surgical history.   Current Outpatient Medications  Medication Sig Dispense Refill   acetaminophen (TYLENOL) 500 MG tablet Take 500 mg by mouth every 6 (six) hours as needed for headache (pain).     alendronate (FOSAMAX) 70 MG tablet TAKE 1 TABLET BY MOUTH 1 TIME A WEEK EARLY IN THE MORNING. DO NOT LIE DOWN OR HAVE ANYTHING BUT WATER TO DRINK FOR 30 MINUTES     aspirin 81 MG chewable tablet Chew 1 tablet (81 mg total) by mouth daily. 30 tablet 0   fluticasone furoate-vilanterol (BREO ELLIPTA) 200-25 MCG/ACT AEPB Inhale 1 puff into the lungs daily. 60 each 0   metoprolol tartrate (LOPRESSOR) 25 MG tablet Take 25 mg by mouth daily.     Multiple Vitamin (MULTIVITAMIN WITH MINERALS) TABS tablet Take 1 tablet by mouth at bedtime.     Pramoxine-HC (HYDROCORTISONE ACE-PRAMOXINE) 2.5-1 % CREA Apply 1 application topically 2 (two) times daily as needed (hemorrhoids).     PREVIDENT 5000 BOOSTER PLUS 1.1 %  PSTE Place onto teeth as directed.     spironolactone (ALDACTONE) 25 MG tablet Take 0.5 tablets (12.5 mg total) by mouth daily. (Patient taking differently: Take 12.5 mg by mouth daily. Per patient haven't had to use.) 180 tablet 3   No current facility-administered medications for this visit.    Allergies:   Codeine    Social History:  The patient  reports that she has been smoking cigarettes. She does not have any smokeless tobacco history on file. She reports current alcohol use. She reports that she does not use drugs.   Family History:  The patient's family  history includes Heart disease in her father; Hypertension in her brother.    ROS:  Please see the history of present illness.   Otherwise, review of systems are positive for difficulty gaining weight.   All other systems are reviewed and negative.    PHYSICAL EXAM: VS:  BP (!) 160/100 Comment: BP right arm 160/100  Pulse 93   Ht 5\' 4"  (1.626 m)   Wt 96 lb 12.8 oz (43.9 kg)   SpO2 97%   BMI 16.62 kg/m  , BMI Body mass index is 16.62 kg/m. GEN: Well nourished, well developed, in no acute distress HEENT: normal Neck: no JVD, carotid bruits, or masses Cardiac: RRR; no murmurs, rubs, or gallops,no edema  Respiratory:  clear to auscultation bilaterally, normal work of breathing GI: soft, nontender, nondistended, + BS MS: no deformity or atrophy Skin: warm and dry, no rash Neuro:  Strength and sensation are intact Psych: euthymic mood, full affect   EKG:   The ekg ordered today demonstrates NSR, RBBB, nonspecific ST segment changes   Recent Labs: No results found for requested labs within last 365 days.   Lipid Panel    Component Value Date/Time   TRIG 100 12/10/2021 0421     Other studies Reviewed: Additional studies/ records that were reviewed today with results demonstrating: labs 2024 TC 205, HDL 85.   ASSESSMENT AND PLAN:  Nonischemic cardiomyopathy: Appears euvolemic.  No sx of CHF.  Resolved by second echo in 11/2022.  Taking only metoprolol at bedtime.   She did not fill spironolactone.  SInce LVEF improved, will continue current meds.  Elevated blood pressure: Home readings are 110-120 systolic.   Tobacco abuse: Still smoking 1/2 ppd.  Still not ready to quit.   Current medicines are reviewed at length with the patient today.  The patient concerns regarding her medicines were addressed.  The following changes have been made:  No change  Labs/ tests ordered today include:   Orders Placed This Encounter  Procedures   EKG 12-Lead    Recommend 150  minutes/week of aerobic exercise Low fat, low carb, high fiber diet recommended  Disposition:   FU in 1 year with noninvasive MD   Signed, Lance Muss, MD  07/02/2023 1:40 PM    [facility] [address] [place], [place], [place]  [address] Phone: [phone]; Fax: [phone]

## 2023-07-02 ENCOUNTER — Ambulatory Visit: Payer: Medicare HMO | Attending: Interventional Cardiology | Admitting: Interventional Cardiology

## 2023-07-02 ENCOUNTER — Encounter: Payer: Self-pay | Admitting: Interventional Cardiology

## 2023-07-02 VITALS — BP 160/100 | HR 93 | Ht 64.0 in | Wt 96.8 lb

## 2023-07-02 DIAGNOSIS — I428 Other cardiomyopathies: Secondary | ICD-10-CM

## 2023-07-02 DIAGNOSIS — I10 Essential (primary) hypertension: Secondary | ICD-10-CM

## 2023-07-02 DIAGNOSIS — Z720 Tobacco use: Secondary | ICD-10-CM

## 2023-07-02 NOTE — Patient Instructions (Signed)
Medication Instructions:  Your physician recommends that you continue on your current medications as directed. Please refer to the Current Medication list given to you today.  *If you need a refill on your cardiac medications before your next appointment, please call your pharmacy*   Lab Work: none If you have labs (blood work) drawn today and your tests are completely normal, you will receive your results only by: MyChart Message (if you have MyChart) OR A paper copy in the mail If you have any lab test that is abnormal or we need to change your treatment, we will call you to review the results.   Testing/Procedures: none   Follow-Up: At [facility], you and your health needs are our priority.  As part of our continuing mission to provide you with exceptional heart care, we have created designated Provider Care Teams.  These Care Teams include your primary Cardiologist (physician) and Advanced Practice Providers (APPs -  Physician Assistants and Nurse Practitioners) who all work together to provide you with the care you need, when you need it.  We recommend signing up for the patient portal called "MyChart".  Sign up information is provided on this After Visit Summary.  MyChart is used to connect with patients for Virtual Visits (Telemedicine).  Patients are able to view lab/test results, encounter notes, upcoming appointments, etc.  Non-urgent messages can be sent to your provider as well.   To learn more about what you can do with MyChart, go to [email or web address].    Your next appointment:   12 month(s)  Provider:     Other Instructions I would be happy to help recommend someone for you to see in the future who will take excellent care of you.  If you prefer to stay at our [facility] street office, I am referring my patient's to Drs. Skains and [place],  If you prefer to go to our [place] office near [facility], I recommend seeing Drs. Cindie Crumbly  and [place].  If you prefer to go to our [place] office,I recommend Dr. Cristal Deer and Dr. Duke Salvia.  Once you choose your provider, feel free to call their office to schedule!  Wishing you all the best!  Sincerely,  Everette Rank, MD

## 2023-10-06 DIAGNOSIS — E785 Hyperlipidemia, unspecified: Secondary | ICD-10-CM | POA: Diagnosis not present

## 2023-10-06 DIAGNOSIS — M810 Age-related osteoporosis without current pathological fracture: Secondary | ICD-10-CM | POA: Diagnosis not present

## 2023-10-06 DIAGNOSIS — F1010 Alcohol abuse, uncomplicated: Secondary | ICD-10-CM | POA: Diagnosis not present

## 2023-10-06 DIAGNOSIS — I2510 Atherosclerotic heart disease of native coronary artery without angina pectoris: Secondary | ICD-10-CM | POA: Diagnosis not present

## 2023-10-06 DIAGNOSIS — I501 Left ventricular failure: Secondary | ICD-10-CM | POA: Diagnosis not present

## 2023-10-06 DIAGNOSIS — F17210 Nicotine dependence, cigarettes, uncomplicated: Secondary | ICD-10-CM | POA: Diagnosis not present

## 2023-10-06 DIAGNOSIS — I429 Cardiomyopathy, unspecified: Secondary | ICD-10-CM | POA: Diagnosis not present

## 2023-10-06 DIAGNOSIS — Z8249 Family history of ischemic heart disease and other diseases of the circulatory system: Secondary | ICD-10-CM | POA: Diagnosis not present

## 2023-10-06 DIAGNOSIS — Z9181 History of falling: Secondary | ICD-10-CM | POA: Diagnosis not present

## 2023-10-06 DIAGNOSIS — J4489 Other specified chronic obstructive pulmonary disease: Secondary | ICD-10-CM | POA: Diagnosis not present

## 2023-10-06 DIAGNOSIS — Z809 Family history of malignant neoplasm, unspecified: Secondary | ICD-10-CM | POA: Diagnosis not present

## 2023-10-06 DIAGNOSIS — I700 Atherosclerosis of aorta: Secondary | ICD-10-CM | POA: Diagnosis not present

## 2023-10-24 ENCOUNTER — Emergency Department (HOSPITAL_COMMUNITY): Payer: Medicare HMO

## 2023-10-24 ENCOUNTER — Encounter (HOSPITAL_COMMUNITY): Payer: Self-pay

## 2023-10-24 ENCOUNTER — Other Ambulatory Visit: Payer: Self-pay

## 2023-10-24 ENCOUNTER — Observation Stay (HOSPITAL_COMMUNITY)
Admission: EM | Admit: 2023-10-24 | Discharge: 2023-10-25 | Disposition: A | Discharge status: Home / Self Care | Payer: Medicare HMO | Attending: Internal Medicine | Admitting: Internal Medicine

## 2023-10-24 DIAGNOSIS — R911 Solitary pulmonary nodule: Secondary | ICD-10-CM

## 2023-10-24 DIAGNOSIS — T68XXXA Hypothermia, initial encounter: Secondary | ICD-10-CM

## 2023-10-24 DIAGNOSIS — W19XXXA Unspecified fall, initial encounter: Principal | ICD-10-CM

## 2023-10-24 DIAGNOSIS — E872 Acidosis, unspecified: Secondary | ICD-10-CM | POA: Diagnosis present

## 2023-10-24 DIAGNOSIS — K623 Rectal prolapse: Secondary | ICD-10-CM | POA: Diagnosis not present

## 2023-10-24 DIAGNOSIS — I6529 Occlusion and stenosis of unspecified carotid artery: Secondary | ICD-10-CM | POA: Insufficient documentation

## 2023-10-24 DIAGNOSIS — R000 Tachycardia, unspecified: Secondary | ICD-10-CM | POA: Diagnosis not present

## 2023-10-24 DIAGNOSIS — R404 Transient alteration of awareness: Secondary | ICD-10-CM | POA: Diagnosis not present

## 2023-10-24 DIAGNOSIS — J449 Chronic obstructive pulmonary disease, unspecified: Secondary | ICD-10-CM | POA: Diagnosis not present

## 2023-10-24 DIAGNOSIS — I428 Other cardiomyopathies: Secondary | ICD-10-CM | POA: Diagnosis not present

## 2023-10-24 DIAGNOSIS — Z743 Need for continuous supervision: Secondary | ICD-10-CM | POA: Diagnosis not present

## 2023-10-24 DIAGNOSIS — Z23 Encounter for immunization: Secondary | ICD-10-CM | POA: Diagnosis not present

## 2023-10-24 DIAGNOSIS — S0101XA Laceration without foreign body of scalp, initial encounter: Secondary | ICD-10-CM | POA: Diagnosis not present

## 2023-10-24 DIAGNOSIS — F1090 Alcohol use, unspecified, uncomplicated: Secondary | ICD-10-CM | POA: Diagnosis not present

## 2023-10-24 DIAGNOSIS — M19071 Primary osteoarthritis, right ankle and foot: Secondary | ICD-10-CM | POA: Diagnosis not present

## 2023-10-24 DIAGNOSIS — Z79899 Other long term (current) drug therapy: Secondary | ICD-10-CM | POA: Insufficient documentation

## 2023-10-24 DIAGNOSIS — I700 Atherosclerosis of aorta: Secondary | ICD-10-CM | POA: Insufficient documentation

## 2023-10-24 DIAGNOSIS — J432 Centrilobular emphysema: Secondary | ICD-10-CM | POA: Diagnosis not present

## 2023-10-24 DIAGNOSIS — F17210 Nicotine dependence, cigarettes, uncomplicated: Secondary | ICD-10-CM | POA: Insufficient documentation

## 2023-10-24 DIAGNOSIS — R680 Hypothermia, not associated with low environmental temperature: Secondary | ICD-10-CM | POA: Insufficient documentation

## 2023-10-24 DIAGNOSIS — S91012A Laceration without foreign body, left ankle, initial encounter: Secondary | ICD-10-CM | POA: Diagnosis not present

## 2023-10-24 DIAGNOSIS — R739 Hyperglycemia, unspecified: Secondary | ICD-10-CM | POA: Diagnosis not present

## 2023-10-24 DIAGNOSIS — R918 Other nonspecific abnormal finding of lung field: Secondary | ICD-10-CM | POA: Diagnosis not present

## 2023-10-24 DIAGNOSIS — M818 Other osteoporosis without current pathological fracture: Secondary | ICD-10-CM | POA: Diagnosis not present

## 2023-10-24 DIAGNOSIS — I878 Other specified disorders of veins: Secondary | ICD-10-CM | POA: Diagnosis not present

## 2023-10-24 DIAGNOSIS — R4182 Altered mental status, unspecified: Secondary | ICD-10-CM | POA: Insufficient documentation

## 2023-10-24 DIAGNOSIS — S0003XA Contusion of scalp, initial encounter: Secondary | ICD-10-CM | POA: Diagnosis not present

## 2023-10-24 DIAGNOSIS — I739 Peripheral vascular disease, unspecified: Secondary | ICD-10-CM | POA: Diagnosis not present

## 2023-10-24 LAB — CBC
HCT: 44.7 % (ref 36.0–46.0)
Hemoglobin: 15.2 g/dL — ABNORMAL HIGH (ref 12.0–15.0)
MCH: 38.3 pg — ABNORMAL HIGH (ref 26.0–34.0)
MCHC: 34 g/dL (ref 30.0–36.0)
MCV: 112.6 fL — ABNORMAL HIGH (ref 80.0–100.0)
Platelets: 268 10*3/uL (ref 150–400)
RBC: 3.97 MIL/uL (ref 3.87–5.11)
RDW: 13.7 % (ref 11.5–15.5)
WBC: 13.6 10*3/uL — ABNORMAL HIGH (ref 4.0–10.5)
nRBC: 0 % (ref 0.0–0.2)

## 2023-10-24 LAB — RAPID URINE DRUG SCREEN, HOSP PERFORMED
Amphetamines: NOT DETECTED
Barbiturates: NOT DETECTED
Benzodiazepines: NOT DETECTED
Cocaine: NOT DETECTED
Opiates: NOT DETECTED
Tetrahydrocannabinol: NOT DETECTED

## 2023-10-24 LAB — URINALYSIS, ROUTINE W REFLEX MICROSCOPIC
Bilirubin Urine: NEGATIVE
Glucose, UA: NEGATIVE mg/dL
Ketones, ur: NEGATIVE mg/dL
Leukocytes,Ua: NEGATIVE
Nitrite: NEGATIVE
Protein, ur: 100 mg/dL — AB
Specific Gravity, Urine: 1.008 (ref 1.005–1.030)
pH: 6 (ref 5.0–8.0)

## 2023-10-24 LAB — I-STAT CHEM 8, ED
BUN: 8 mg/dL (ref 8–23)
BUN: 12 mg/dL (ref 8–23)
Calcium, Ion: 1.05 mmol/L — ABNORMAL LOW (ref 1.15–1.40)
Calcium, Ion: 1 mmol/L — ABNORMAL LOW (ref 1.15–1.40)
Chloride: 106 mmol/L (ref 98–111)
Chloride: 107 mmol/L (ref 98–111)
Creatinine, Ser: 0.7 mg/dL (ref 0.44–1.00)
Creatinine, Ser: 0.6 mg/dL (ref 0.44–1.00)
Glucose, Bld: 243 mg/dL — ABNORMAL HIGH (ref 70–99)
Glucose, Bld: 75 mg/dL (ref 70–99)
HCT: 44 % (ref 36.0–46.0)
HCT: 28 % — ABNORMAL LOW (ref 36.0–46.0)
Hemoglobin: 15 g/dL (ref 12.0–15.0)
Hemoglobin: 9.5 g/dL — ABNORMAL LOW (ref 12.0–15.0)
Potassium: 5.6 mmol/L — ABNORMAL HIGH (ref 3.5–5.1)
Potassium: 4.6 mmol/L (ref 3.5–5.1)
Sodium: 137 mmol/L (ref 135–145)
Sodium: 141 mmol/L (ref 135–145)
TCO2: 20 mmol/L — ABNORMAL LOW (ref 22–32)
TCO2: 20 mmol/L — ABNORMAL LOW (ref 22–32)

## 2023-10-24 LAB — I-STAT CG4 LACTIC ACID, ED
Lactic Acid, Venous: 8.2 mmol/L (ref 0.5–1.9)
Lactic Acid, Venous: 7.6 mmol/L (ref 0.5–1.9)
Lactic Acid, Venous: 6.1 mmol/L (ref 0.5–1.9)

## 2023-10-24 LAB — COMPREHENSIVE METABOLIC PANEL
ALT: 36 U/L (ref 0–44)
AST: 75 U/L — ABNORMAL HIGH (ref 15–41)
Albumin: 2.5 g/dL — ABNORMAL LOW (ref 3.5–5.0)
Alkaline Phosphatase: 38 U/L (ref 38–126)
Anion gap: 15 (ref 5–15)
BUN: 11 mg/dL (ref 8–23)
CO2: 16 mmol/L — ABNORMAL LOW (ref 22–32)
Calcium: 7.5 mg/dL — ABNORMAL LOW (ref 8.9–10.3)
Chloride: 109 mmol/L (ref 98–111)
Creatinine, Ser: 0.53 mg/dL (ref 0.44–1.00)
GFR, Estimated: >60 mL/min (ref >60)
Glucose, Bld: 80 mg/dL (ref 70–99)
Potassium: 4.5 mmol/L (ref 3.5–5.1)
Sodium: 140 mmol/L (ref 135–145)
Total Bilirubin: 0.7 mg/dL (ref 0.0–1.2)
Total Protein: 4.3 g/dL — ABNORMAL LOW (ref 6.5–8.1)

## 2023-10-24 LAB — TROPONIN I (HIGH SENSITIVITY)
Troponin I (High Sensitivity): 10 ng/L (ref <18)
Troponin I (High Sensitivity): 8 ng/L (ref <18)

## 2023-10-24 LAB — PROTIME-INR
INR: 1 (ref 0.8–1.2)
Prothrombin Time: 13.3 s (ref 11.4–15.2)

## 2023-10-24 LAB — CK: Total CK: 969 U/L — ABNORMAL HIGH (ref 38–234)

## 2023-10-24 LAB — MAGNESIUM: Magnesium: 2.2 mg/dL (ref 1.7–2.4)

## 2023-10-24 LAB — LACTIC ACID, PLASMA
Lactic Acid, Venous: 5.7 mmol/L (ref 0.5–1.9)
Lactic Acid, Venous: 1.7 mmol/L (ref 0.5–1.9)

## 2023-10-24 LAB — ETHANOL: Alcohol, Ethyl (B): 248 mg/dL — ABNORMAL HIGH (ref <10)

## 2023-10-24 MED ORDER — LACTATED RINGERS IV BOLUS (SEPSIS)
1000.0000 mL | Freq: Once | INTRAVENOUS | Status: AC
Start: 2023-10-24 — End: 2023-10-24
  Administered 2023-10-24: 1000 mL via INTRAVENOUS

## 2023-10-24 MED ORDER — LACTATED RINGERS IV BOLUS (SEPSIS)
250.0000 mL | Freq: Once | INTRAVENOUS | Status: AC
Start: 2023-10-24 — End: 2023-10-24
  Administered 2023-10-24: 250 mL via INTRAVENOUS

## 2023-10-24 MED ORDER — TETANUS-DIPHTH-ACELL PERTUSSIS 5-2.5-18.5 LF-MCG/0.5 IM SUSY
0.5000 mL | PREFILLED_SYRINGE | Freq: Once | INTRAMUSCULAR | Status: AC
  Administered 2023-10-24: 0.5 mL via INTRAMUSCULAR

## 2023-10-24 MED ORDER — VANCOMYCIN HCL IN DEXTROSE 1-5 GM/200ML-% IV SOLN
1000.0000 mg | Freq: Once | INTRAVENOUS | Status: AC
  Administered 2023-10-24: 1000 mg via INTRAVENOUS
  Filled 2023-10-24: qty 200

## 2023-10-24 MED ORDER — SODIUM CHLORIDE 0.9 % IV BOLUS
1000.0000 mL | Freq: Once | INTRAVENOUS | Status: AC
  Administered 2023-10-24: 1000 mL via INTRAVENOUS

## 2023-10-24 MED ORDER — LACTATED RINGERS IV SOLN: INTRAVENOUS | Status: AC

## 2023-10-24 MED ORDER — LACTATED RINGERS IV BOLUS
1000.0000 mL | Freq: Once | INTRAVENOUS | Status: AC
  Administered 2023-10-24: 1000 mL via INTRAVENOUS

## 2023-10-24 MED ORDER — THIAMINE MONONITRATE 100 MG PO TABS
100.0000 mg | ORAL_TABLET | Freq: Every day | ORAL | Status: DC
  Administered 2023-10-24 – 2023-10-25 (×2): 100 mg via ORAL
  Filled 2023-10-24 (×2): qty 1

## 2023-10-24 MED ORDER — SODIUM CHLORIDE 0.9 % IV SOLN
2.0000 g | Freq: Once | INTRAVENOUS | Status: AC
  Administered 2023-10-24: 2 g via INTRAVENOUS
  Filled 2023-10-24: qty 12.5

## 2023-10-24 MED ORDER — METRONIDAZOLE 500 MG/100ML IV SOLN
500.0000 mg | Freq: Once | INTRAVENOUS | Status: AC
  Administered 2023-10-24: 500 mg via INTRAVENOUS
  Filled 2023-10-24: qty 100

## 2023-10-24 MED ORDER — ACETAMINOPHEN 325 MG PO TABS
650.0000 mg | ORAL_TABLET | Freq: Four times a day (QID) | ORAL | Status: DC | PRN
  Administered 2023-10-24: 650 mg via ORAL
  Filled 2023-10-24 (×2): qty 2

## 2023-10-24 MED ORDER — THIAMINE HCL 100 MG/ML IJ SOLN: 100.0000 mg | Freq: Every day | INTRAMUSCULAR | Status: DC

## 2023-10-24 MED ORDER — ADULT MULTIVITAMIN W/MINERALS CH
1.0000 | ORAL_TABLET | Freq: Every day | ORAL | Status: DC
  Administered 2023-10-24 – 2023-10-25 (×2): 1 via ORAL
  Filled 2023-10-24 (×2): qty 1

## 2023-10-24 MED ORDER — FOLIC ACID 1 MG PO TABS
1.0000 mg | ORAL_TABLET | Freq: Every day | ORAL | Status: DC
  Administered 2023-10-24 – 2023-10-25 (×2): 1 mg via ORAL
  Filled 2023-10-24 (×2): qty 1

## 2023-10-24 MED ORDER — METOPROLOL TARTRATE 25 MG PO TABS
25.0000 mg | ORAL_TABLET | Freq: Every day | ORAL | Status: DC
  Administered 2023-10-24: 25 mg via ORAL
  Filled 2023-10-24: qty 1

## 2023-10-24 MED ORDER — ACETAMINOPHEN 650 MG RE SUPP: 650.0000 mg | Freq: Four times a day (QID) | RECTAL | Status: DC | PRN

## 2023-10-24 MED ORDER — OXYCODONE HCL 5 MG PO TABS
5.0000 mg | ORAL_TABLET | Freq: Four times a day (QID) | ORAL | Status: DC | PRN
  Administered 2023-10-24 – 2023-10-25 (×4): 5 mg via ORAL
  Filled 2023-10-24 (×4): qty 1

## 2023-10-24 MED ORDER — LIDOCAINE-EPINEPHRINE 1 %-1:100000 IJ SOLN
10.0000 mL | Freq: Once | INTRAMUSCULAR | Status: AC
  Administered 2023-10-24: 10 mL via INTRADERMAL
  Filled 2023-10-24: qty 1

## 2023-10-24 MED ORDER — NICOTINE 21 MG/24HR TD PT24
21.0000 mg | MEDICATED_PATCH | Freq: Every day | TRANSDERMAL | Status: DC
  Administered 2023-10-24 – 2023-10-25 (×2): 21 mg via TRANSDERMAL
  Filled 2023-10-24 (×2): qty 1

## 2023-10-24 NOTE — H&P (Signed)
 Date: 10/24/2023               Patient Name:  Gina Cherry MRN: [id number]  DOB: May 20, 1956 Age / Sex: 68 y.o., female   PCP: Rox [place], MD         Medical Service: Internal Medicine Teaching Service         Attending Physician: Dr. Rosan Dayton BROCKS, DO      First Contact: Dr. Damien Lease, DO Pager [phone]    Second Contact: Dr. Ozell Kung, MD Pager [phone]         After Hours (After 5p/  First Contact Pager: [phone]  weekends / holidays): Second Contact Pager: [phone]   SUBJECTIVE   Chief Complaint: altered mental status/fall  History of Present Illness: [name] is a 68 yo female with COPD, osteoporosis, alcohol use disorder, and NICM who presents after her husband found her down on the driveway around 5am.  The patient's boyfriend went to bed last night and woke up around 5 am and had realized the patient had not gone to bed. He then found her outside, laying down on the driveway. He is not sure if she just fell/tripped on the porch, or syncopized. Although she was awake when he found her, she was altered and cold. He called EMS immediately and when they arrived, she was noted to be combative, cool to the touch, with SBP in the 100s, CBG in the 300s. Temp on arrival was 82.7F.  By the time I evaluated the patient, she was awake, alert, and oriented x4. She denies any recent illnesses and states that she has been in her usual state of health for the last few days to weeks. She denies any fevers, chills, chest pain, SOB, abd pain, n/v/d, or urinary symptoms. She does not recall what happened last night, but states that she often goes out at night to smoke a cigarette. She drinks about 3 glasses of wine everyday, but denies drinking more than this yesterday. She has never had a syncopal episode before, but states that she has had a previous fall when she was sick with pneumonia.    Meds:  Current Meds  Medication Sig   alendronate (FOSAMAX) 70 MG tablet  Take 70 mg by mouth once a week. Saturday 10/20/23 last dose.   metoprolol  tartrate (LOPRESSOR ) 25 MG tablet Take 25 mg by mouth 2 (two) times daily.    Past Medical History  History reviewed. No pertinent surgical history.  Social:  Lives With: boyfriend in [place] Occupation: retired Support: boyfriend Level of Function: independent of ADLs, iADLs PCP: Dr. Rox (OBGYN) Substances: Smokes 0.5 ppd x 40 years, drinks ~3 glasses of wine/day, no other substance use   Family History:  Diabetes (mother)  Allergies: Allergies as of 10/24/2023 - Review Complete 10/24/2023  Allergen Reaction Noted   Codeine  10/24/2023    Review of Systems: A complete ROS was negative except as per HPI.   OBJECTIVE:   Physical Exam: Blood pressure (!) 103/54, pulse 90, temperature 98 F (36.7 C), resp. rate (!) 21, height 5' (1.524 m), weight 40.8 kg, SpO2 95%.   Constitutional: chronically ill-appearing female laying in bed, in no acute distress; she is laying comfortable with warming blankets  HENT: scalp with ~2 cm lac that has 2 staples in place; dried blood in hair Cardiovascular: regular rate and rhythm, no m/r/g Pulmonary/Chest: normal work of breathing on room air, lungs clear to auscultation bilaterally Abdominal: soft, non-tender, non-distended MSK: normal  bulk and tone Neurological: alert & oriented x 4, moves all extremities spontaneously, able to follow all commands; normal finger to nose Skin: skin is fragile, covered in abrasions, dried blood, and ecchymoses; 3 sutures on left ankle and 2 staples on scalp Psych: appropriate  Labs: CBC    Component Value Date/Time   WBC 13.6 (H) 10/24/2023 0546   RBC 3.97 10/24/2023 0546   HGB 9.5 (L) 10/24/2023 0853   HCT 28.0 (L) 10/24/2023 0853   PLT 268 10/24/2023 0546   MCV 112.6 (H) 10/24/2023 0546   MCH 38.3 (H) 10/24/2023 0546   MCHC 34.0 10/24/2023 0546   RDW 13.7 10/24/2023 0546     CMP     Component Value Date/Time   NA  140 10/24/2023 0856   K 4.5 10/24/2023 0856   CL 109 10/24/2023 0856   CO2 16 (L) 10/24/2023 0856   GLUCOSE 80 10/24/2023 0856   BUN 11 10/24/2023 0856   CREATININE 0.53 10/24/2023 0856   CALCIUM  7.5 (L) 10/24/2023 0856   PROT 4.3 (L) 10/24/2023 0856   ALBUMIN 2.5 (L) 10/24/2023 0856   AST 75 (H) 10/24/2023 0856   ALT 36 10/24/2023 0856   ALKPHOS 38 10/24/2023 0856   BILITOT 0.7 10/24/2023 0856   GFRNONAA >60 10/24/2023 0856    Imaging: CTH: Negative for bleed/skull fracture, scalp hematoma seen   EKG: personally reviewed my interpretation is sinus rhythm with one probable PVC. Prior EKG reviewed and was consistent with NSR.   ASSESSMENT & PLAN:   Assessment & Plan by Problem: Principal Problem:   Lactic acidosis   [name] is a 68 y.o. person living with a history of COPD, osteoporosis, alcohol use disorder, and NICM who presented after being found down and admitted for lactic acidosis on hospital day 0  Lactic acidosis  Fall vs syncope Patient presented after being found down outside on driveway. She met SIRS criteria with initial temp of 82.34F (improved to normal now), and RR in the 20s. HR and BP within normal range. Lactic acid level initially elevated to 8.2, but is down-trending to 5.7 after receving IVF. CK initially 969. She also received empiric vanc, flagyl , and cefepime , however, there is not a clear source of infection and her labs/vitals can be explained by being found down outside. U/A negative for nitrites/leukocytes. CXR with no focal consolidation. With her ethanol level of 248, I suspect she fell and was not able to get up on her own and my suspicion for syncope is lower.  - Follow up blood cultures, but will discontinue antibiotics now as my suspicion for infection is lower - Trend lactic acid - Continue LR 150 cc/hr - PT/OT - Telemetry monitoring  Alcohol use disorder Patient endorses drinking 3 glasses of wine per day. Ethanol level was 248 on  admission. She denies any history of alcohol withdrawal.  - CIWA without ativan - Thiamine , folic acid , and multivitamin  Lung nodule 14 mm lung nodule seen in medial right apex. Recommending follow up non-contrast CT at 3 months.   Nonischemic cardiomyopathy Last echo in 2023 with EF of 70-75%, moderate LVH, moderately enlarged RV, normal aortic and mitral valves, and minimal tricuspid regurgitation. On metoprolol  25 mg once daily at home. - Resumed home metoprolol  25 mg daily  Osteoporosis On fosamax 70 mg weekly.   Tobacco use disorder Smokes ~0.5 ppd x 40 years. Nicotine  patch provided.   Diet: Normal VTE: Enoxaparin  IVF: LR, 150 cc/hr Code: Full  Prior to Admission  Living Arrangement: Home, living boyfriend Anticipated Discharge Location: Home Barriers to Discharge: medical stability  Dispo: Admit patient to Observation with expected length of stay less than 2 midnights.  Signed: Lunabelle Oatley N, DO Internal Medicine Resident PGY-3  10/24/2023, 11:50 AM

## 2023-10-24 NOTE — ED Notes (Signed)
 Attempted rectal temperature thermometer read low.

## 2023-10-24 NOTE — ED Notes (Signed)
 Pt has multiple skin tears on both legs

## 2023-10-24 NOTE — Progress Notes (Signed)
 CSW added substance abuse resources.  Edwin Dada, MSW, LCSW Transitions of Care  Clinical Social Worker II [phone]

## 2023-10-24 NOTE — ED Provider Notes (Signed)
 Physical Exam  BP (!) 150/107   Pulse 81   Temp (!) 84.2 F (29 C)   Resp 18   Ht 1.524 m (5')   Wt 40.8 kg   SpO2 98%   BMI 17.58 kg/m   Physical Exam  Procedures  .Laceration Repair  Date/Time: 10/24/2023 8:57 AM  Performed by: Levander Houston, MD Authorized by: Levander Houston, MD   Consent:    Consent obtained:  Verbal   Consent given by:  Patient   Alternatives discussed:  No treatment Universal protocol:    Patient identity confirmed:  Verbally with patient Anesthesia:    Anesthesia method:  None Laceration details:    Location:  Scalp   Length (cm):  2.7 Pre-procedure details:    Preparation:  Patient was prepped and draped in usual sterile fashion Exploration:    Limited defect created (wound extended): no     Imaging obtained: x-Nelissa Bolduc     Imaging outcome: foreign body not noted   Treatment:    Area cleansed with:  Saline   Amount of cleaning:  Standard   Irrigation solution:  Sterile saline   Irrigation method:  Syringe   Visualized foreign bodies/material removed: no   Skin repair:    Repair method:  Staples   Number of staples:  2 Approximation:    Approximation:  Close Repair type:    Repair type:  Simple   ED Course / MDM   Clinical Course as of 10/24/23 0856  Wed Oct 24, 2023  0818 CT head without acute abnormality [DR]  0819 Ct spine without acute traumatic injury noted [DR]  0819 Left ankle without acute fracture  [DR]  0820 Right ankle without acute fracture [DR]  0821 No acute fracture or dislocation right knee [DR]  0821 No acute fx dislocation right foot [DR]  9178 No acute fx or dislocation left foot  [DR]  9177 Ct abdomen pelvis without acute abnormality but 14 mm lung nodule [DR]    Clinical Course User Index [DR] Levander Houston, MD   Medical Decision Making Amount and/or Complexity of Data Reviewed Labs: ordered. Radiology: ordered.  Risk Prescription drug management.   68 yo female ho copd etoh abuse found down  outside. Here hypothermic CT scans and labs pending. IV one liter infused   Patient is a 68 year old female who has a history of COPD, osteoporosis, nonischemic cardiomyopathy, on metoprolol , daily alcohol use continues to smoke.  Her husband states that last night she was drinking a considerable amount which is not unusual.  He went to bed and woke up in the early morning hours and could not find her.  Eventually found her outside on the driveway.  He thinks that she may have fallen off of the porch.  She was confused. On arrival to the emergency department her initial temp was 82.6 with oxygen saturations 91% and normal heart rate.  She had been hypertensive. Here in the ED she was evaluated with physical exam that revealed multiple skin tears and lacerations. Laceration to back of head that has been repaired Multiple skin tears that he did and dressed Laceration to the left ankle that is being repaired Patient noted to have leukocytosis white count of 13,600 and initial lactic that was elevated at 8.2.  Patient has received IV fluids and broad-spectrum antibiotics Blood cultures were ordered I-STAT reviewed significant for normal sodium, normal potassium, normal BUN and creatinine Repeat lactic decreased from 8.2-7.6 Patient is warming with last temp 89.2 with warming blanket  and warmed IV fluids in place Patient is now awake and alert. 1 found on ground-fall versus syncope-patient does not remember event husband at bedside unable to give further history other than finding her outside 2 multiple skin lacerations, abrasions, skin tears secondary to #1.  Patient had full trauma workup with CT head, neck, chest abdomen and pelvis and multiple x-rays which did not show any acute intrathoracic, intracranial or intra-abdominal injuries or fractures. 3 severe hypothermia secondary to #1 patient is warming at this time 4 patient meets sepsis criteria with hypothermia although likely environmental and  elevated leukocytosis and lactic acid.  Patient is given IV fluids and broad-spectrum antibiotics until cultures are negative.  No obvious source of infection noted on exam Alcohol 248.  Patient reports no significant history of alcohol withdrawal in the past.  She is borderline hypotensive at this time heart rate is normal but she is on beta-blocker.  She will need to be observed for alcohol withdrawal 5 confusion/encephalopathy likely secondary to alcohol and hypothermia appears to be clearing at this time. Plan admission for ongoing evaluation and treatment Care discussed with Dr. Jimmy on for IM ts and will see for admission    Levander Houston, MD 10/24/23 [phone]

## 2023-10-24 NOTE — ED Provider Notes (Signed)
.  Laceration Repair  Date/Time: 10/24/2023 9:17 AM  Performed by: Lorelle Aleck BROCKS, PA-C Authorized by: Lorelle Aleck BROCKS, PA-C   Consent:    Consent obtained:  Verbal   Consent given by:  Patient   Risks, benefits, and alternatives were discussed: yes     Risks discussed:  Infection, poor cosmetic result and pain Universal protocol:    Procedure explained and questions answered to patient or proxy's satisfaction: yes     Relevant documents present and verified: yes     Test results available: yes     Imaging studies available: yes     Required blood products, implants, devices, and special equipment available: yes     Site/side marked: yes     Immediately prior to procedure, a time out was called: yes     Patient identity confirmed:  Verbally with patient Anesthesia:    Anesthesia method:  Local infiltration   Local anesthetic:  Lidocaine  1% WITH epi Laceration details:    Location:  Foot   Foot location:  L ankle   Length (cm):  4   Depth (mm):  3 Pre-procedure details:    Preparation:  Patient was prepped and draped in usual sterile fashion and imaging obtained to evaluate for foreign bodies Exploration:    Limited defect created (wound extended): no     Hemostasis achieved with:  Direct pressure   Imaging obtained: x-ray     Imaging outcome: foreign body not noted     Wound exploration: wound explored through full range of motion and entire depth of wound visualized     Wound extent: no underlying fracture     Contaminated: no   Treatment:    Area cleansed with:  Saline   Amount of cleaning:  Standard   Irrigation solution:  Sterile saline   Irrigation volume:  100   Irrigation method:  Syringe   Visualized foreign bodies/material removed: no     Debridement:  None   Undermining:  None   Scar revision: no   Skin repair:    Repair method:  Sutures   Suture size:  4-0   Suture material:  Prolene   Suture technique:  Simple interrupted   Number of sutures:   3 Approximation:    Approximation:  Close Repair type:    Repair type:  Intermediate Post-procedure details:    Dressing:  Non-adherent dressing   Procedure completion:  Tolerated well, no immediate complications Comments:     Laceration does not appear to involve joint space. Full ROM of left ankle. X-ray negative for underlying fracture and foreign body.      Lorelle Aleck BROCKS, PA-C 10/24/23 0920    Levander Houston, MD 10/24/23 [phone]

## 2023-10-24 NOTE — Progress Notes (Signed)
 Transition of Care [facility]) - CAGE-AID Screening   Patient Details  Name: Gina Cherry MRN: [id number] Date of Birth: 1956/04/09  Transition of Care [facility]) CM/SW Contact:    Sallyanne MALVA Mettle, RN Phone Number: 10/24/2023, 7:29 AM   CAGE-AID Screening: Substance Abuse Screening unable to be completed due to: : Patient unable to participate           Confused, combative, ETOH on board at the time of arrival.

## 2023-10-24 NOTE — Sepsis Progress Note (Addendum)
 Elink is following per sepsis protocol.  Bedside nurse attempted to obtain blood cultures x2 unable. Started antibiotics.

## 2023-10-24 NOTE — ED Notes (Signed)
 ED TO INPATIENT HANDOFF REPORT  ED Nurse Name and Phone #: Dorthea PEAK [phone]  S Name/Age/Gender Gina Cherry Bellis 68 y.o. female Room/Bed: 037C/037C  Code Status   Code Status: Full Code  Home/SNF/Other Home Patient oriented to: self, place, time, and situation Is this baseline? Yes   Triage Complete: Triage complete  Chief Complaint Lactic acidosis [E87.20]  Triage Note Pt coming in from home where she was found down in drive way last known well midnight. Pt admits to drinking alcohol and pt family reports drinking today. Pt fell possibly hit head. C Collar placed by ems.    Ems vitals  Cbg 309 Bp 101palp Hr 74 Saturation 95% End title 25 Rr 17 Gcs 12    Allergies Allergies  Allergen Reactions   Codeine     unknown    Level of Care/Admitting Diagnosis ED Disposition     ED Disposition  Admit   Condition  --   Comment  Hospital Area: [facility] [[id number]]  Level of Care: Telemetry Medical [104]  May place patient in observation at [facility] or [facility] if equivalent level of care is available:: No  Covid Evaluation: Asymptomatic - no recent exposure (last 10 days) testing not required  Diagnosis: Lactic acidosis [827775]  Admitting Physician: ROSAN DAYTON JAYSON LORINE  Attending Physician: ROSAN DAYTON C [[id number]]          B Medical/Surgery History History reviewed. No pertinent past medical history. History reviewed. No pertinent surgical history.   A IV Location/Drains/Wounds Patient Lines/Drains/Airways Status     Active Line/Drains/Airways     Name Placement date Placement time Site Days   Peripheral IV 10/24/23 20 G Anterior;Right Forearm 10/24/23  0527  Forearm  less than 1   Peripheral IV 10/24/23 18 G Anterior;Left Forearm 10/24/23  0839  Forearm  less than 1   Urethral Catheter Mandy RN Temperature probe 10/24/23  0559  Temperature probe  less than 1            Intake/Output Last 24 hours No intake or  output data in the 24 hours ending 10/24/23 1235  Labs/Imaging Results for orders placed or performed during the hospital encounter of 10/24/23 (from the past 48 hours)  I-Stat Chem 8, ED     Status: Abnormal   Collection Time: 10/24/23  5:37 AM  Result Value Ref Range   Sodium 137 135 - 145 mmol/L   Potassium 5.6 (H) 3.5 - 5.1 mmol/L   Chloride 106 98 - 111 mmol/L   BUN 8 8 - 23 mg/dL   Creatinine, Ser 9.29 0.44 - 1.00 mg/dL   Glucose, Bld 756 (H) 70 - 99 mg/dL    Comment: Glucose reference range applies only to samples taken after fasting for at least 8 hours.   Calcium , Ion 1.05 (L) 1.15 - 1.40 mmol/L   TCO2 20 (L) 22 - 32 mmol/L   Hemoglobin 15.0 12.0 - 15.0 g/dL   HCT 55.9 63.9 - 53.9 %  I-Stat Lactic Acid, ED     Status: Abnormal   Collection Time: 10/24/23  5:39 AM  Result Value Ref Range   Lactic Acid, Venous 8.2 (HH) 0.5 - 1.9 mmol/L   Comment NOTIFIED PHYSICIAN   CBC     Status: Abnormal   Collection Time: 10/24/23  5:46 AM  Result Value Ref Range   WBC 13.6 (H) 4.0 - 10.5 K/uL   RBC 3.97 3.87 - 5.11 MIL/uL   Hemoglobin 15.2 (H) 12.0 - 15.0  g/dL   HCT 55.2 63.9 - 53.9 %   MCV 112.6 (H) 80.0 - 100.0 fL   MCH 38.3 (H) 26.0 - 34.0 pg   MCHC 34.0 30.0 - 36.0 g/dL   RDW 86.2 88.4 - 84.4 %   Platelets 268 150 - 400 K/uL   nRBC 0.0 0.0 - 0.2 %    Comment: Performed at [facility], [address] [place], [place] [address]  Protime-INR     Status: None   Collection Time: 10/24/23  5:46 AM  Result Value Ref Range   Prothrombin Time 13.3 11.4 - 15.2 seconds   INR 1.0 0.8 - 1.2    Comment: (NOTE) INR goal varies based on device and disease states. Performed at [facility], [address], [place], [place] [address]   Magnesium      Status: None   Collection Time: 10/24/23  5:46 AM  Result Value Ref Range   Magnesium  2.2 1.7 - 2.4 mg/dL    Comment: HEMOLYSIS AT THIS LEVEL MAY AFFECT RESULT Performed at [facility], [address],  [place], [place] [address]   Troponin I (High Sensitivity)     Status: None   Collection Time: 10/24/23  5:46 AM  Result Value Ref Range   Troponin I (High Sensitivity) 10 <18 ng/L    Comment: (NOTE) Elevated high sensitivity troponin I (hsTnI) values and significant  changes across serial measurements may suggest ACS but many other  chronic and acute conditions are known to elevate hsTnI results.  Refer to the Links section for chest pain algorithms and additional  guidance. Performed at [facility], [address], [place], [place] [address]   Ethanol     Status: Abnormal   Collection Time: 10/24/23  5:47 AM  Result Value Ref Range   Alcohol, Ethyl (B) 248 (H) <10 mg/dL    Comment: (NOTE) Lowest detectable limit for serum alcohol is 10 mg/dL.  For medical purposes only. Performed at [facility], [address] [place], [place] [address]   Urinalysis, Routine w reflex microscopic -Urine, Catheterized     Status: Abnormal   Collection Time: 10/24/23  6:19 AM  Result Value Ref Range   Color, Urine YELLOW YELLOW   APPearance CLEAR CLEAR   Specific Gravity, Urine 1.008 1.005 - 1.030   pH 6.0 5.0 - 8.0   Glucose, UA NEGATIVE NEGATIVE mg/dL   Hgb urine dipstick SMALL (A) NEGATIVE   Bilirubin Urine NEGATIVE NEGATIVE   Ketones, ur NEGATIVE NEGATIVE mg/dL   Protein, ur 899 (A) NEGATIVE mg/dL   Nitrite NEGATIVE NEGATIVE   Leukocytes,Ua NEGATIVE NEGATIVE   RBC / HPF 0-5 0 - 5 RBC/hpf   WBC, UA 0-5 0 - 5 WBC/hpf   Bacteria, UA RARE (A) NONE SEEN   Squamous Epithelial / HPF 0-5 0 - 5 /HPF   Hyaline Casts, UA PRESENT     Comment: Performed at [facility], [address] [place], [place] [address]  Rapid urine drug screen (hospital performed)     Status: None   Collection Time: 10/24/23  6:19 AM  Result Value Ref Range   Opiates NONE DETECTED NONE DETECTED   Cocaine NONE DETECTED NONE DETECTED   Benzodiazepines NONE DETECTED NONE DETECTED   Amphetamines NONE  DETECTED NONE DETECTED   Tetrahydrocannabinol NONE DETECTED NONE DETECTED   Barbiturates NONE DETECTED NONE DETECTED    Comment: (NOTE) DRUG SCREEN FOR MEDICAL PURPOSES ONLY.  IF  CONFIRMATION IS NEEDED FOR ANY PURPOSE, NOTIFY LAB WITHIN 5 DAYS.  LOWEST DETECTABLE LIMITS FOR URINE DRUG SCREEN Drug Class                     Cutoff (ng/mL) Amphetamine and metabolites    1000 Barbiturate and metabolites    200 Benzodiazepine                 200 Opiates and metabolites        300 Cocaine and metabolites        300 THC                            50 Performed at [facility], [address], [place], [place] [address]   I-stat chem 8, ED (not at [facility], [facility] or [facility])     Status: Abnormal   Collection Time: 10/24/23  8:53 AM  Result Value Ref Range   Sodium 141 135 - 145 mmol/L   Potassium 4.6 3.5 - 5.1 mmol/L   Chloride 107 98 - 111 mmol/L   BUN 12 8 - 23 mg/dL   Creatinine, Ser 9.39 0.44 - 1.00 mg/dL   Glucose, Bld 75 70 - 99 mg/dL    Comment: Glucose reference range applies only to samples taken after fasting for at least 8 hours.   Calcium , Ion 1.00 (L) 1.15 - 1.40 mmol/L   TCO2 20 (L) 22 - 32 mmol/L   Hemoglobin 9.5 (L) 12.0 - 15.0 g/dL   HCT 71.9 (L) 63.9 - 53.9 %  I-Stat CG4 Lactic Acid     Status: Abnormal   Collection Time: 10/24/23  8:53 AM  Result Value Ref Range   Lactic Acid, Venous 7.6 (HH) 0.5 - 1.9 mmol/L   Comment NOTIFIED PHYSICIAN   Troponin I (High Sensitivity)     Status: None   Collection Time: 10/24/23  8:56 AM  Result Value Ref Range   Troponin I (High Sensitivity) 8 <18 ng/L    Comment: (NOTE) Elevated high sensitivity troponin I (hsTnI) values and significant  changes across serial measurements may suggest ACS but many other  chronic and acute conditions are known to elevate hsTnI results.  Refer to the Links section for chest pain algorithms and additional  guidance. Performed at [facility], [address], [place], [place] [address]    CK     Status: Abnormal   Collection Time: 10/24/23  8:56 AM  Result Value Ref Range   Total CK 969 (H) 38 - 234 U/L    Comment: HEMOLYSIS AT THIS LEVEL MAY AFFECT RESULT Performed at [facility], [address], [place], [place] [address]   Comprehensive metabolic panel     Status: Abnormal   Collection Time: 10/24/23  8:56 AM  Result Value Ref Range   Sodium 140 135 - 145 mmol/L   Potassium 4.5 3.5 - 5.1 mmol/L    Comment: HEMOLYSIS AT THIS LEVEL MAY AFFECT RESULT   Chloride 109 98 - 111 mmol/L   CO2 16 (L) 22 - 32 mmol/L   Glucose, Bld 80 70 - 99 mg/dL    Comment: Glucose reference range applies only to samples taken after fasting for at least 8 hours.   BUN 11 8 - 23 mg/dL   Creatinine, Ser 9.46 0.44 - 1.00 mg/dL   Calcium  7.5 (L) 8.9 - 10.3 mg/dL   Total Protein 4.3 (L) 6.5 -  8.1 g/dL   Albumin 2.5 (L) 3.5 - 5.0 g/dL   AST 75 (H) 15 - 41 U/L    Comment: HEMOLYSIS AT THIS LEVEL MAY AFFECT RESULT   ALT 36 0 - 44 U/L    Comment: HEMOLYSIS AT THIS LEVEL MAY AFFECT RESULT   Alkaline Phosphatase 38 38 - 126 U/L   Total Bilirubin 0.7 0.0 - 1.2 mg/dL    Comment: HEMOLYSIS AT THIS LEVEL MAY AFFECT RESULT   GFR, Estimated >60 >60 mL/min    Comment: (NOTE) Calculated using the CKD-EPI Creatinine Equation (2021)    Anion gap 15 5 - 15    Comment: Performed at [facility], [address] [place], [place] [address]  Lactic acid, plasma     Status: Abnormal   Collection Time: 10/24/23 10:30 AM  Result Value Ref Range   Lactic Acid, Venous 5.7 (HH) 0.5 - 1.9 mmol/L    Comment: CRITICAL RESULT CALLED TO, READ BACK BY AND VERIFIED WITH A.REYNOLDS RN @1127  01.08.2025 E.AHMED Performed at [facility], [address], [place], [place] [address]   I-Stat CG4 Lactic Acid     Status: Abnormal   Collection Time: 10/24/23 10:43 AM  Result Value Ref Range   Lactic Acid, Venous 6.1 (HH) 0.5 - 1.9 mmol/L   Comment NOTIFIED PHYSICIAN    DG Ankle Left Port Result Date:  10/24/2023 CLINICAL DATA:  68 year old female found down outside in driveway. Combative. EXAM: PORTABLE LEFT ANKLE - 2 VIEW COMPARISON:  Left foot series today. FINDINGS: 2 portable views at 0542 hours. Bone mineralization is within normal limits. Maintained left mortise joint alignment. No evidence of joint effusion on the lateral view. Distal tibia, fibula, talus and calcaneus appear intact. Some calcified peripheral vascular disease. Sock artifact at the left foot. IMPRESSION: No acute fracture or dislocation identified about the left ankle. Electronically Signed   By: VEAR Hurst M.D.   On: 10/24/2023 06:58   DG Ankle Right Port Result Date: 10/24/2023 CLINICAL DATA:  68 year old female found down outside in driveway. Combative. EXAM: PORTABLE RIGHT ANKLE - 2 VIEW COMPARISON:  Right foot series today. FINDINGS: 2 portable views at 0542 hours. Bone mineralization is within normal limits. Maintained mortise joint alignment. No evidence of right ankle joint effusion. Distal tibia, fibula, talus and calcaneus appear intact. No acute osseous abnormality identified. Some calcified peripheral vascular disease. Sock/clothing artifact at the foot. IMPRESSION: No acute fracture or dislocation identified about the right ankle. Electronically Signed   By: VEAR Hurst M.D.   On: 10/24/2023 06:57   DG Knee Right Port Result Date: 10/24/2023 CLINICAL DATA:  68 year old female found down outside in driveway. Combative. EXAM: PORTABLE RIGHT KNEE - 1-2 VIEW COMPARISON:  None Available. FINDINGS: Portable AP and cross-table lateral views at 0545 hours. Some calcified peripheral vascular disease. Mild medial compartment joint space loss. Otherwise normal joint spaces and alignment at the right knee. Patella intact. No evidence of joint effusion. No acute osseous abnormality identified. IMPRESSION: No acute fracture or dislocation identified about the right knee. Electronically Signed   By: VEAR Hurst M.D.   On: 10/24/2023 06:57   DG Foot  Complete Right Result Date: 10/24/2023 CLINICAL DATA:  68 year old female found down outside in driveway. Combative. EXAM: RIGHT FOOT COMPLETE - 3+ VIEW COMPARISON:  None Available. FINDINGS: Two portable views at 0541 hours. 2nd toe ring artifact. Bone mineralization is within normal limits. Some calcified peripheral vascular disease. No acute osseous abnormality identified. 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 No discrete soft tissue injury. IMPRESSION: No acute fracture or dislocation identified about the right foot. Electronically Signed   By: VEAR Hurst M.D.   On: 10/24/2023 06:56   DG Foot Complete Left Result Date: 10/24/2023 CLINICAL DATA:  68 year old female found down outside in driveway. Combative. EXAM: LEFT FOOT - COMPLETE 3+ VIEW COMPARISON:  None Available. FINDINGS: Two portable views at 0542 hours. 2nd and 3rd toe ring artifact. Bone mineralization is within normal limits. Some calcified peripheral vascular disease. No acute osseous abnormality identified. Sock/clothing artifact limits detail of the soft tissues. IMPRESSION: No acute fracture or dislocation identified about the left foot. Electronically Signed   By: VEAR Hurst M.D.   On: 10/24/2023 06:55   CT CHEST ABDOMEN PELVIS WO CONTRAST Result Date: 10/24/2023 CLINICAL DATA:  68 year old female found down outside in driveway. Combative. EXAM: CT CHEST, ABDOMEN AND PELVIS WITHOUT CONTRAST TECHNIQUE: Multidetector CT imaging of the chest, abdomen and pelvis was performed following the standard protocol without IV contrast. RADIATION DOSE REDUCTION: This exam was performed according to the departmental dose-optimization program which includes automated exposure control, adjustment of the mA and/or kV according to patient size and/or use of iterative reconstruction technique. COMPARISON:  Trauma series radiographs today. CT cervical spine today. Chest radiographs in 2023. FINDINGS: CT CHEST FINDINGS Cardiovascular: Noncontrast exam. Calcified aortic  atherosclerosis. Calcified coronary artery atherosclerosis. Normal heart size. No pericardial effusion. Vascular patency is not evaluated in the absence of IV contrast. Mediastinum/Nodes: Negative noncontrast mediastinum, no hematoma, mass, or lymphadenopathy identified. Lungs/Pleura: Centrilobular emphysema. Major airways are patent with trace layering secretions in the right bronchus intermedius on series 5, image 100. No pneumothorax. No pleural effusion. Curvilinear, platelike, and partially nodular and calcified opacity in the right lower lobe most resembles postinflammatory scarring, with right lung base opacification in 2023. Nearby small calcified granuloma in the medial basal segment on series 5, image 140. No pulmonary contusion identified. However, indeterminate appearing 14 mm medial right apical solid lung nodule also on series 6, image 40, series 5, image 45. Musculoskeletal: Less motion artifact in the lower cervical spine at the C7 level. Cervicothoracic junction alignment is within normal limits. Maintained thoracic vertebral height. Chronic thoracic disc and endplate degeneration primarily in the midthoracic spine. No thoracic vertebral fracture identified. No sternal fracture identified. Visible shoulder osseous structures appear grossly intact with mild motion artifact. Intermittent rib motion artifact. No acute rib fracture is identified. CT ABDOMEN PELVIS FINDINGS Hepatobiliary: Mild motion artifact. No perihepatic fluid identified. Negative noncontrast liver and gallbladder. Pancreas: Negative noncontrast pancreas. Spleen: Diminutive.  Mild motion artifact.  No perisplenic fluid. Adrenals/Urinary Tract: Noncontrast adrenal glands and kidneys appear symmetric and within normal limits. Foley catheter decompresses the urinary bladder. Stomach/Bowel: No dilated large or small bowel. No free air or free fluid identified. Limited bowel detail due to motion. Small volume residual fluid in the  stomach. No obvious bowel inflammation. But there is evidence of pelvic floor laxity and a left of midline rectal prolapse as seen on sagittal image 66 and series 3, image 22. No regional inflammation. Vascular/Lymphatic: Extensive Aortoiliac calcified atherosclerosis. Vascular patency is not evaluated in the absence of IV contrast. Normal caliber abdominal aorta. No lymphadenopathy identified. Reproductive: Diminutive or absent uterus, ovaries. Other: No pelvis free fluid. Musculoskeletal: Motion artifact at the L1-L2 level. Chronic appearing L3 superior endplate irregularity. Maintained vertebral height. No acute lumbar fracture is evident. Mild motion artifact also in the pelvis. Sacrum, SI joints, pelvis, and proximal femurs  appear grossly intact. No superficial soft tissue injury identified. IMPRESSION: 1. Noncontrast exam intermittently degraded by motion. No acute traumatic injury identified. 2. Emphysema (ICD10-J43.9), with indeterminate 14 mm lung nodule in the medial right apex. Per Fleischner Society Guidelines, consider a non-contrast Chest CT at 3 months, a PET/CT, or tissue sampling (Reference: Radiology. 2017; 284(1):228-43). Furthermore, partially calcified right lower lobe lung scarring is suspected, and attention is directed on the above follow-up. 3. Evidence of pelvic floor laxity with left of midline rectal prolapse. 4.  Aortic Atherosclerosis (ICD10-I70.0). Electronically Signed   By: VEAR Hurst M.D.   On: 10/24/2023 06:54   DG Pelvis Portable Result Date: 10/24/2023 CLINICAL DATA:  68 year old female found down outside in driveway. Combative. EXAM: PORTABLE PELVIS 1-2 VIEWS COMPARISON:  Abdominal radiographs 11/30/2021. FINDINGS: Portable AP supine view at 0535 hours. Bone mineralization is within normal limits. Femoral heads are normally located. No pelvis fracture identified. Grossly intact proximal femurs. Nonobstructed bowel-gas pattern. Pelvic phleboliths. IMPRESSION: No acute fracture or  dislocation identified about the pelvis. Electronically Signed   By: VEAR Hurst M.D.   On: 10/24/2023 06:43   DG Chest Port 1 View Result Date: 10/24/2023 CLINICAL DATA:  68 year old female found down outside in driveway. Combative. EXAM: PORTABLE CHEST 1 VIEW COMPARISON:  Portable chest 12/09/2021 and earlier. FINDINGS: Portable AP supine view at 0534 hours. Extubated on the previous exam. Larger lung volumes and largely cleared bilateral lung base opacity seen in 2023. Plate like atelectasis or scarring now in the right lower lung. External spring like artifact projects over the right diaphragm. Normal cardiac size and mediastinal contours. Visualized tracheal air column is within normal limits. No pneumothorax, pleural effusion, pulmonary edema or other confluent opacity. Negative visible bowel gas. No acute osseous abnormality identified. IMPRESSION: 1.  No acute traumatic injury identified. 2. Substantially improved lung base ventilation since 2023 with platelike residual atelectasis or scarring on the right. 3. Some external artifact. Electronically Signed   By: VEAR Hurst M.D.   On: 10/24/2023 06:43   CT CERVICAL SPINE WO CONTRAST Result Date: 10/24/2023 CLINICAL DATA:  68 year old female found down outside in driveway. EXAM: CT CERVICAL SPINE WITHOUT CONTRAST TECHNIQUE: Multidetector CT imaging of the cervical spine was performed without intravenous contrast. Multiplanar CT image reconstructions were also generated. RADIATION DOSE REDUCTION: This exam was performed according to the departmental dose-optimization program which includes automated exposure control, adjustment of the mA and/or kV according to patient size and/or use of iterative reconstruction technique. COMPARISON:  Head CT today. FINDINGS: Intermittent motion artifact. Alignment: Straightening of cervical lordosis. Motion artifact limits detail of the cervicothoracic junction, but the bilateral posterior element alignment appears maintained.  Skull base and vertebrae: Visualized skull base is intact. No atlanto-occipital dissociation. C1 and C2 appear intact and aligned. Motion artifact is most pronounced in the cervical spine C5 through C7. No cervical vertebral fracture is identified. Soft tissues and spinal canal: No prevertebral fluid or swelling. No visible canal hematoma. Calcified cervical carotid atherosclerosis. Necklace artifact. Disc levels: Widespread cervical spine disc, endplate, facet degeneration. Detail limited due to motion. Upper chest: Significant motion artifact. Chest CT today is reported separately. IMPRESSION: Motion degraded exam with no acute traumatic injury identified in the cervical spine. Electronically Signed   By: VEAR Hurst M.D.   On: 10/24/2023 06:41   CT HEAD WO CONTRAST Result Date: 10/24/2023 CLINICAL DATA:  68 year old female found down outside in driveway. EXAM: CT HEAD WITHOUT CONTRAST TECHNIQUE: Contiguous axial images were obtained from the  base of the skull through the vertex without intravenous contrast. RADIATION DOSE REDUCTION: This exam was performed according to the departmental dose-optimization program which includes automated exposure control, adjustment of the mA and/or kV according to patient size and/or use of iterative reconstruction technique. COMPARISON:  Head CT 11/28/2021. FINDINGS: Brain: Cerebral volume remains normal for age. No midline shift, ventriculomegaly, mass effect, evidence of mass lesion, intracranial hemorrhage or evidence of cortically based acute infarction. Mild motion artifact along the anterior convexity and at the vertex. Gray-white differentiation appears stable and within normal limits for age. Vascular: Calcified atherosclerosis at the skull base. No suspicious intracranial vascular hyperdensity. Skull: Motion artifact. No acute osseous abnormality identified; a linear lucency on coronal image 59 is identified on sagittal image 9 and unchanged since 2023 (vascular channel).  Sinuses/Orbits: Visualized paranasal sinuses and mastoids are stable and well aerated. Other: Motion artifact. Broad-based posterior convexity scalp hematoma or contusion series 4, image 71. No scalp soft tissue gas. Underlying calvarium appears intact. Postoperative changes to both globes since 2023, orbits soft tissues otherwise negative. IMPRESSION: 1. Motion artifact. 2. Posterior convexity scalp hematoma. No skull fracture identified. 3. Stable and negative non contrast CT appearance of the brain. Electronically Signed   By: VEAR Hurst M.D.   On: 10/24/2023 06:39    Pending Labs Unresulted Labs (From admission, onward)     Start     Ordered   10/25/23 0500  HIV Antibody (routine testing w rflx)  (HIV Antibody (Routine testing w reflex) panel)  Tomorrow morning,   R        10/24/23 0945   10/25/23 0500  Basic metabolic panel  Tomorrow morning,   R        10/24/23 0945   10/25/23 0500  CBC  Tomorrow morning,   R        10/24/23 0945   10/24/23 1000  Lactic acid, plasma  (Lactic Acid)  STAT Now then every 3 hours,   R (with STAT occurrences)      10/24/23 0946   10/24/23 0700  Blood culture (routine x 2)  BLOOD CULTURE X 2,   R (with STAT occurrences)      10/24/23 0659            Vitals/Pain Today's Vitals   10/24/23 1100 10/24/23 1115 10/24/23 1145 10/24/23 1230  BP: (!) 141/57 122/60  131/68  Pulse: 93 92 100 95  Resp: 20 19 (!) 21 (!) 21  Temp: 98.8 F (37.1 C) 98.9 F (37.2 C) 99.2 F (37.3 C) 99.8 F (37.7 C)  SpO2: 97% 96% 96% 97%  Weight:      Height:        Isolation Precautions No active isolations  Medications Medications  lactated ringers  infusion ( Intravenous Rate/Dose Change 10/24/23 0905)  acetaminophen  (TYLENOL ) tablet 650 mg (has no administration in time range)    Or  acetaminophen  (TYLENOL ) suppository 650 mg (has no administration in time range)  Tdap (BOOSTRIX) injection 0.5 mL (0.5 mLs Intramuscular Given 10/24/23 0615)  sodium chloride  0.9 % bolus  1,000 mL (0 mLs Intravenous Stopped 10/24/23 0626)  lactated ringers  bolus 1,000 mL (0 mLs Intravenous Stopped 10/24/23 0842)    And  lactated ringers  bolus 250 mL (0 mLs Intravenous Stopped 10/24/23 0906)  ceFEPIme  (MAXIPIME ) 2 g in sodium chloride  0.9 % 100 mL IVPB (0 g Intravenous Stopped 10/24/23 0832)  metroNIDAZOLE  (FLAGYL ) IVPB 500 mg (0 mg Intravenous Stopped 10/24/23 0953)  vancomycin  (VANCOCIN ) IVPB 1000 mg/200  mL premix (0 mg Intravenous Stopped 10/24/23 1020)  lidocaine -EPINEPHrine  (XYLOCAINE  W/EPI) 1 %-1:100000 (with pres) injection 10 mL (10 mLs Intradermal Given 10/24/23 0906)  lactated ringers  bolus 1,000 mL (0 mLs Intravenous Stopped 10/24/23 1048)    Mobility walks     Focused Assessments Musculoskeletal    R Recommendations: See Admitting Provider Note  Report given to:   Additional Notes:

## 2023-10-24 NOTE — ED Notes (Signed)
 Trauma Response Nurse Documentation   Gina Cherry is a 68 y.o. female arriving to [facility] ED via EMS  On unknown antithrombotic. Trauma was activated as a Level 2 by ED charge RN based on the following trauma criteria GCS 10-14 associated with trauma or AVPU < A.  Patient cleared for CT by Dr. Melvenia EDP. Pt transported to CT with trauma response nurse present to monitor. RN remained with the patient throughout their absence from the department for clinical observation.   GCS 12.    History   History reviewed. No pertinent past medical history.   History reviewed. No pertinent surgical history.     Initial Focused Assessment (If applicable, or please see trauma documentation): Confused female coming from her driveway, found down by family. Skin tears to all four extremities. Cold, mottled.  Airway patent, BS clear Bleeding to skin tears controlled with guaze GCS 12 PERRLA 3  CT's Completed:   CT Head, CT C-Spine, CT Chest w/ contrast, and CT abdomen/pelvis w/ contrast   Interventions:  IV start and trauma lab draw Portable chest, pelvis and extremity xrays NS bolus Bair hugger Temp foley CAGEAID Plan for disposition:  Admit  Consults completed:  None at the time of this note  Event Summary: Presents via EMS from outside, found down with skin tears to body. Combative and agitated, does not know who she is. ETOH on board. Mottled, rectal temp undetectable, temp foley placed with temp of 82. Pending workup, anticipate admit. CAGEAID completed. MTP Summary (If applicable): NA  Bedside handoff with ED RN Alan.    Bailynn Dyk O Ha Shannahan  Trauma Response RN  Please call TRN at [phone] for further assistance.

## 2023-10-24 NOTE — ED Triage Notes (Signed)
 Pt coming in from home where she was found down in drive way last known well midnight. Pt admits to drinking alcohol and pt family reports drinking today. Pt fell possibly hit head. C Collar placed by ems.    Ems vitals  Cbg 309 Bp 101palp Hr 74 Saturation 95% End title 25 Rr 17 Gcs 12

## 2023-10-24 NOTE — ED Provider Notes (Signed)
 [place] [facility] AT [facility] Provider Note   CSN: [id number] Arrival date & time: 10/24/23  9478     History  Chief Complaint  Patient presents with   [place]    Gina Cherry is a 68 y.o. female.      Fall  Patient presents for altered mental status.  Known medical history per EMS is alcohol abuse and osteoporosis.  She was last seen well at midnight.  This morning, shortly prior to arrival, she was found down on the ground outside of her home.  She has been awake but confused with EMS.  She has been combative.  They noted that she was cool to touch.  SBP was in the 100s.  CBG was in the 300s.  Heart rate was normal.  No interventions were given prior to arrival.     Home Medications Prior to Admission medications   Medication Sig Start Date End Date Taking? Authorizing Provider  alendronate (FOSAMAX) 70 MG tablet Take 70 mg by mouth once a week. 10/06/23   [provider]  gatifloxacin (ZYMAXID) 0.5 % SOLN Place 1 drop into the left eye 4 (four) times daily. 05/11/23   [provider]  ketorolac (ACULAR) 0.4 % SOLN Place 1 drop into the left eye 4 (four) times daily. 06/01/23   [provider]  metoprolol  tartrate (LOPRESSOR ) 25 MG tablet Take 25 mg by mouth 2 (two) times daily. 04/29/23   [provider]  prednisoLONE acetate (PRED FORTE) 1 % ophthalmic suspension Place 1 drop into the left eye 4 (four) times daily. 05/11/23   [provider]      Allergies    Patient has no allergy information on record.    Review of Systems   Review of Systems  Unable to perform ROS: Mental status change    Physical Exam Updated Vital Signs BP (!) 150/107   Pulse 78   Temp (!) 89.2 F (31.8 C)   Resp 15   Ht 5' (1.524 m)   Wt 40.8 kg   SpO2 99%   BMI 17.58 kg/m  Physical Exam Vitals and nursing note reviewed.  Constitutional:      General: She is not in acute distress.    Appearance: She is  well-developed. She is ill-appearing. She is not toxic-appearing or diaphoretic.  HENT:     Head: Normocephalic and atraumatic.     Right Ear: External ear normal.     Left Ear: External ear normal.     Nose: Nose normal.     Mouth/Throat:     Mouth: Mucous membranes are dry.  Eyes:     Extraocular Movements: Extraocular movements intact.     Conjunctiva/sclera: Conjunctivae normal.  Neck:     Comments: C-collar in place Cardiovascular:     Rate and Rhythm: Normal rate and regular rhythm.     Heart sounds: No murmur heard. Pulmonary:     Effort: Pulmonary effort is normal. No respiratory distress.     Breath sounds: Normal breath sounds. No wheezing or rales.  Chest:     Chest wall: No tenderness.  Abdominal:     General: There is no distension.     Palpations: Abdomen is soft.     Tenderness: There is no abdominal tenderness.  Musculoskeletal:        General: No swelling or deformity.     Cervical back: Neck supple.     Right lower leg: No edema.  Left lower leg: No edema.  Skin:    General: Skin is warm and dry.     Capillary Refill: Capillary refill takes less than 2 seconds.     Comments: Skin tears to distal bilateral legs; mottled skin diffusely  Neurological:     General: No focal deficit present.     Mental Status: She is alert. She is disoriented.  Psychiatric:        Mood and Affect: Mood normal.        Behavior: Behavior normal.     ED Results / Procedures / Treatments   Labs (all labs ordered are listed, but only abnormal results are displayed) Labs Reviewed  CBC - Abnormal; Notable for the following components:      Result Value   WBC 13.6 (*)    Hemoglobin 15.2 (*)    MCV 112.6 (*)    MCH 38.3 (*)    All other components within normal limits  ETHANOL - Abnormal; Notable for the following components:   Alcohol, Ethyl (B) 248 (*)    All other components within normal limits  URINALYSIS, ROUTINE W REFLEX MICROSCOPIC - Abnormal; Notable for the  following components:   Hgb urine dipstick SMALL (*)    Protein, ur 100 (*)    Bacteria, UA RARE (*)    All other components within normal limits  I-STAT CHEM 8, ED - Abnormal; Notable for the following components:   Potassium 5.6 (*)    Glucose, Bld 243 (*)    Calcium , Ion 1.05 (*)    TCO2 20 (*)    All other components within normal limits  I-STAT CG4 LACTIC ACID, ED - Abnormal; Notable for the following components:   Lactic Acid, Venous 8.2 (*)    All other components within normal limits  CULTURE, BLOOD (ROUTINE X 2)  CULTURE, BLOOD (ROUTINE X 2)  PROTIME-INR  RAPID URINE DRUG SCREEN, HOSP PERFORMED  COMPREHENSIVE METABOLIC PANEL  CK  MAGNESIUM   I-STAT CHEM 8, ED  I-STAT CG4 LACTIC ACID, ED  TROPONIN I (HIGH SENSITIVITY)  TROPONIN I (HIGH SENSITIVITY)    EKG None  Radiology DG Ankle Left Port Result Date: 10/24/2023 CLINICAL DATA:  68 year old female found down outside in driveway. Combative. EXAM: PORTABLE LEFT ANKLE - 2 VIEW COMPARISON:  Left foot series today. FINDINGS: 2 portable views at 0542 hours. Bone mineralization is within normal limits. Maintained left mortise joint alignment. No evidence of joint effusion on the lateral view. Distal tibia, fibula, talus and calcaneus appear intact. Some calcified peripheral vascular disease. Sock artifact at the left foot. IMPRESSION: No acute fracture or dislocation identified about the left ankle. Electronically Signed   By: VEAR Hurst M.D.   On: 10/24/2023 06:58   DG Ankle Right Port Result Date: 10/24/2023 CLINICAL DATA:  68 year old female found down outside in driveway. Combative. EXAM: PORTABLE RIGHT ANKLE - 2 VIEW COMPARISON:  Right foot series today. FINDINGS: 2 portable views at 0542 hours. Bone mineralization is within normal limits. Maintained mortise joint alignment. No evidence of right ankle joint effusion. Distal tibia, fibula, talus and calcaneus appear intact. No acute osseous abnormality identified. Some calcified  peripheral vascular disease. Sock/clothing artifact at the foot. IMPRESSION: No acute fracture or dislocation identified about the right ankle. Electronically Signed   By: VEAR Hurst M.D.   On: 10/24/2023 06:57   DG Knee Right Port Result Date: 10/24/2023 CLINICAL DATA:  68 year old female found down outside in driveway. Combative. EXAM: PORTABLE RIGHT KNEE - 1-2 VIEW  COMPARISON:  None Available. FINDINGS: Portable AP and cross-table lateral views at 0545 hours. Some calcified peripheral vascular disease. Mild medial compartment joint space loss. Otherwise normal joint spaces and alignment at the right knee. Patella intact. No evidence of joint effusion. No acute osseous abnormality identified. IMPRESSION: No acute fracture or dislocation identified about the right knee. Electronically Signed   By: VEAR Hurst M.D.   On: 10/24/2023 06:57   DG Foot Complete Right Result Date: 10/24/2023 CLINICAL DATA:  68 year old female found down outside in driveway. Combative. EXAM: RIGHT FOOT COMPLETE - 3+ VIEW COMPARISON:  None Available. FINDINGS: Two portable views at 0541 hours. 2nd toe ring artifact. Bone mineralization is within normal limits. Some calcified peripheral vascular disease. No acute osseous abnormality identified. 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 No discrete soft tissue injury. IMPRESSION: No acute fracture or dislocation identified about the right foot. Electronically Signed   By: VEAR Hurst M.D.   On: 10/24/2023 06:56   DG Foot Complete Left Result Date: 10/24/2023 CLINICAL DATA:  68 year old female found down outside in driveway. Combative. EXAM: LEFT FOOT - COMPLETE 3+ VIEW COMPARISON:  None Available. FINDINGS: Two portable views at 0542 hours. 2nd and 3rd toe ring artifact. Bone mineralization is within normal limits. Some calcified peripheral vascular disease. No acute osseous abnormality identified. Sock/clothing artifact limits detail of the soft tissues. IMPRESSION: No acute fracture or dislocation  identified about the left foot. Electronically Signed   By: VEAR Hurst M.D.   On: 10/24/2023 06:55   CT CHEST ABDOMEN PELVIS WO CONTRAST Result Date: 10/24/2023 CLINICAL DATA:  68 year old female found down outside in driveway. Combative. EXAM: CT CHEST, ABDOMEN AND PELVIS WITHOUT CONTRAST TECHNIQUE: Multidetector CT imaging of the chest, abdomen and pelvis was performed following the standard protocol without IV contrast. RADIATION DOSE REDUCTION: This exam was performed according to the departmental dose-optimization program which includes automated exposure control, adjustment of the mA and/or kV according to patient size and/or use of iterative reconstruction technique. COMPARISON:  Trauma series radiographs today. CT cervical spine today. Chest radiographs in 2023. FINDINGS: CT CHEST FINDINGS Cardiovascular: Noncontrast exam. Calcified aortic atherosclerosis. Calcified coronary artery atherosclerosis. Normal heart size. No pericardial effusion. Vascular patency is not evaluated in the absence of IV contrast. Mediastinum/Nodes: Negative noncontrast mediastinum, no hematoma, mass, or lymphadenopathy identified. Lungs/Pleura: Centrilobular emphysema. Major airways are patent with trace layering secretions in the right bronchus intermedius on series 5, image 100. No pneumothorax. No pleural effusion. Curvilinear, platelike, and partially nodular and calcified opacity in the right lower lobe most resembles postinflammatory scarring, with right lung base opacification in 2023. Nearby small calcified granuloma in the medial basal segment on series 5, image 140. No pulmonary contusion identified. However, indeterminate appearing 14 mm medial right apical solid lung nodule also on series 6, image 40, series 5, image 45. Musculoskeletal: Less motion artifact in the lower cervical spine at the C7 level. Cervicothoracic junction alignment is within normal limits. Maintained thoracic vertebral height. Chronic thoracic disc  and endplate degeneration primarily in the midthoracic spine. No thoracic vertebral fracture identified. No sternal fracture identified. Visible shoulder osseous structures appear grossly intact with mild motion artifact. Intermittent rib motion artifact. No acute rib fracture is identified. CT ABDOMEN PELVIS FINDINGS Hepatobiliary: Mild motion artifact. No perihepatic fluid identified. Negative noncontrast liver and gallbladder. Pancreas: Negative noncontrast pancreas. Spleen: Diminutive.  Mild motion artifact.  No perisplenic fluid. Adrenals/Urinary Tract: Noncontrast adrenal glands and kidneys appear symmetric and within normal limits. Foley catheter decompresses the  urinary bladder. Stomach/Bowel: No dilated large or small bowel. No free air or free fluid identified. Limited bowel detail due to motion. Small volume residual fluid in the stomach. No obvious bowel inflammation. But there is evidence of pelvic floor laxity and a left of midline rectal prolapse as seen on sagittal image 66 and series 3, image 22. No regional inflammation. Vascular/Lymphatic: Extensive Aortoiliac calcified atherosclerosis. Vascular patency is not evaluated in the absence of IV contrast. Normal caliber abdominal aorta. No lymphadenopathy identified. Reproductive: Diminutive or absent uterus, ovaries. Other: No pelvis free fluid. Musculoskeletal: Motion artifact at the L1-L2 level. Chronic appearing L3 superior endplate irregularity. Maintained vertebral height. No acute lumbar fracture is evident. Mild motion artifact also in the pelvis. Sacrum, SI joints, pelvis, and proximal femurs appear grossly intact. No superficial soft tissue injury identified. IMPRESSION: 1. Noncontrast exam intermittently degraded by motion. No acute traumatic injury identified. 2. Emphysema (ICD10-J43.9), with indeterminate 14 mm lung nodule in the medial right apex. Per Fleischner Society Guidelines, consider a non-contrast Chest CT at 3 months, a PET/CT,  or tissue sampling (Reference: Radiology. 2017; 284(1):228-43). Furthermore, partially calcified right lower lobe lung scarring is suspected, and attention is directed on the above follow-up. 3. Evidence of pelvic floor laxity with left of midline rectal prolapse. 4.  Aortic Atherosclerosis (ICD10-I70.0). Electronically Signed   By: VEAR Hurst M.D.   On: 10/24/2023 06:54   DG Pelvis Portable Result Date: 10/24/2023 CLINICAL DATA:  68 year old female found down outside in driveway. Combative. EXAM: PORTABLE PELVIS 1-2 VIEWS COMPARISON:  Abdominal radiographs 11/30/2021. FINDINGS: Portable AP supine view at 0535 hours. Bone mineralization is within normal limits. Femoral heads are normally located. No pelvis fracture identified. Grossly intact proximal femurs. Nonobstructed bowel-gas pattern. Pelvic phleboliths. IMPRESSION: No acute fracture or dislocation identified about the pelvis. Electronically Signed   By: VEAR Hurst M.D.   On: 10/24/2023 06:43   DG Chest Port 1 View Result Date: 10/24/2023 CLINICAL DATA:  68 year old female found down outside in driveway. Combative. EXAM: PORTABLE CHEST 1 VIEW COMPARISON:  Portable chest 12/09/2021 and earlier. FINDINGS: Portable AP supine view at 0534 hours. Extubated on the previous exam. Larger lung volumes and largely cleared bilateral lung base opacity seen in 2023. Plate like atelectasis or scarring now in the right lower lung. External spring like artifact projects over the right diaphragm. Normal cardiac size and mediastinal contours. Visualized tracheal air column is within normal limits. No pneumothorax, pleural effusion, pulmonary edema or other confluent opacity. Negative visible bowel gas. No acute osseous abnormality identified. IMPRESSION: 1.  No acute traumatic injury identified. 2. Substantially improved lung base ventilation since 2023 with platelike residual atelectasis or scarring on the right. 3. Some external artifact. Electronically Signed   By: VEAR Hurst  M.D.   On: 10/24/2023 06:43   CT CERVICAL SPINE WO CONTRAST Result Date: 10/24/2023 CLINICAL DATA:  68 year old female found down outside in driveway. EXAM: CT CERVICAL SPINE WITHOUT CONTRAST TECHNIQUE: Multidetector CT imaging of the cervical spine was performed without intravenous contrast. Multiplanar CT image reconstructions were also generated. RADIATION DOSE REDUCTION: This exam was performed according to the departmental dose-optimization program which includes automated exposure control, adjustment of the mA and/or kV according to patient size and/or use of iterative reconstruction technique. COMPARISON:  Head CT today. FINDINGS: Intermittent motion artifact. Alignment: Straightening of cervical lordosis. Motion artifact limits detail of the cervicothoracic junction, but the bilateral posterior element alignment appears maintained. Skull base and vertebrae: Visualized skull base is intact. No  atlanto-occipital dissociation. C1 and C2 appear intact and aligned. Motion artifact is most pronounced in the cervical spine C5 through C7. No cervical vertebral fracture is identified. Soft tissues and spinal canal: No prevertebral fluid or swelling. No visible canal hematoma. Calcified cervical carotid atherosclerosis. Necklace artifact. Disc levels: Widespread cervical spine disc, endplate, facet degeneration. Detail limited due to motion. Upper chest: Significant motion artifact. Chest CT today is reported separately. IMPRESSION: Motion degraded exam with no acute traumatic injury identified in the cervical spine. Electronically Signed   By: VEAR Hurst M.D.   On: 10/24/2023 06:41   CT HEAD WO CONTRAST Result Date: 10/24/2023 CLINICAL DATA:  68 year old female found down outside in driveway. EXAM: CT HEAD WITHOUT CONTRAST TECHNIQUE: Contiguous axial images were obtained from the base of the skull through the vertex without intravenous contrast. RADIATION DOSE REDUCTION: This exam was performed according to the  departmental dose-optimization program which includes automated exposure control, adjustment of the mA and/or kV according to patient size and/or use of iterative reconstruction technique. COMPARISON:  Head CT 11/28/2021. FINDINGS: Brain: Cerebral volume remains normal for age. No midline shift, ventriculomegaly, mass effect, evidence of mass lesion, intracranial hemorrhage or evidence of cortically based acute infarction. Mild motion artifact along the anterior convexity and at the vertex. Gray-white differentiation appears stable and within normal limits for age. Vascular: Calcified atherosclerosis at the skull base. No suspicious intracranial vascular hyperdensity. Skull: Motion artifact. No acute osseous abnormality identified; a linear lucency on coronal image 59 is identified on sagittal image 9 and unchanged since 2023 (vascular channel). Sinuses/Orbits: Visualized paranasal sinuses and mastoids are stable and well aerated. Other: Motion artifact. Broad-based posterior convexity scalp hematoma or contusion series 4, image 71. No scalp soft tissue gas. Underlying calvarium appears intact. Postoperative changes to both globes since 2023, orbits soft tissues otherwise negative. IMPRESSION: 1. Motion artifact. 2. Posterior convexity scalp hematoma. No skull fracture identified. 3. Stable and negative non contrast CT appearance of the brain. Electronically Signed   By: VEAR Hurst M.D.   On: 10/24/2023 06:39    Procedures Procedures    Medications Ordered in ED Medications  lactated ringers  infusion (has no administration in time range)  lactated ringers  bolus 1,000 mL (has no administration in time range)    And  lactated ringers  bolus 250 mL (has no administration in time range)  ceFEPIme  (MAXIPIME ) 2 g in sodium chloride  0.9 % 100 mL IVPB (has no administration in time range)  metroNIDAZOLE  (FLAGYL ) IVPB 500 mg (has no administration in time range)  vancomycin  (VANCOCIN ) IVPB 1000 mg/200 mL premix  (has no administration in time range)  Tdap (BOOSTRIX) injection 0.5 mL (0.5 mLs Intramuscular Given 10/24/23 0615)  sodium chloride  0.9 % bolus 1,000 mL (0 mLs Intravenous Stopped 10/24/23 9373)    ED Course/ Medical Decision Making/ A&P                                 Medical Decision Making Amount and/or Complexity of Data Reviewed Labs: ordered. Radiology: ordered.  Risk Prescription drug management.   This patient presents to the ED for concern of altered mental status,, this involves an extensive number of treatment options, and is a complaint that carries with it a high risk of complications and morbidity.  The differential diagnosis includes hypothermia, metabolic derangements, intoxication, traumatic injury, polypharmacy, withdrawal   Co morbidities that complicate the patient evaluation  COPD, osteoporosis   Additional  history obtained:  Additional history obtained from EMS, patient's significant other External records from outside source obtained and reviewed including EMR   Lab Tests:  I Ordered, and personally interpreted labs.  The pertinent results include: Elevated alcohol level, lactic acidosis, macrocytosis consistent with chronic alcoholism, leukocytosis are present.   Imaging Studies ordered:  I ordered imaging studies including x-ray of chest, pelvis, bilateral feet, bilateral ankles, right knee; CT of head, cervical spine, chest, abdomen, pelvis I independently visualized and interpreted imaging which showed no acute findings I agree with the radiologist interpretation   Cardiac Monitoring: / EKG:  The patient was maintained on a cardiac monitor.  I personally viewed and interpreted the cardiac monitored which showed an underlying rhythm of: Sinus rhythm   Problem List / ED Course / Critical interventions / Medication management  Patient presenting for altered mental status.  She was found down outside, in the very cold weather.  Core temperature  on arrival was 82 degrees.  She had some skin tears to areas of bilateral feet.  She does not appear to have any chest or abdominal tenderness.  There are no significant deformities.  Despite hypothermia, she was awake and combative.  Restraints were ordered.  Patient was kept on bedside cardiac monitor.  Warming blanket and warmed IV fluids were provided.  Temperature sensing Foley was placed.  Patient to undergo workup for possible traumatic injuries and/for other causes of her altered mental state.  Initial lactic acid was elevated at 8.2.  This is likely multifactorial.  She likely has NADH mismatch due to chronic alcoholism.  I suspect poor perfusion given her hypothermia.  Will continue to trend.  As patient warmed up, she did have improved mentation.  She was now accompanied by her significant other.  He suspects that she went out in the middle the night to smoke a cigarette and had a fall.  She has no known seizure history.  Care of patient was signed out to oncoming ED provider. I ordered medication including IV fluids for hydration; Tdap for tetanus prophylaxis Reevaluation of the patient after these medicines showed that the patient improved I have reviewed the patients home medicines and have made adjustments as needed   Social Determinants of Health:  Lives independently  CRITICAL CARE Performed by: Bernardino Fireman   Total critical care time: 32 minutes  Critical care time was exclusive of separately billable procedures and treating other patients.  Critical care was necessary to treat or prevent imminent or life-threatening deterioration.  Critical care was time spent personally by me on the following activities: development of treatment plan with patient and/or surrogate as well as nursing, discussions with consultants, evaluation of patient's response to treatment, examination of patient, obtaining history from patient or surrogate, ordering and performing treatments and interventions,  ordering and review of laboratory studies, ordering and review of radiographic studies, pulse oximetry and re-evaluation of patient's condition.        Final Clinical Impression(s) / ED Diagnoses Final diagnoses:  Fall, initial encounter  Hypothermia, initial encounter    Rx / DC Orders ED Discharge Orders     None         Fireman Bernardino, MD 10/24/23 [phone]

## 2023-10-24 NOTE — Hospital Course (Addendum)
 Lactic acidosis  Fall vs syncope Patient presented after being found down outside on driveway. She met SIRS criteria with initial temp of 82.27F (improved to normal now), and RR in the 20s. HR and BP within normal range. Lactic acid level initially elevated to 8.2, but is down-trending to 5.7 after receving IVF. CK initially 969. She also received empiric vanc, flagyl , and cefepime , however, there was not a clear source of infection and her labs/vitals can be explained by being found down outside. U/A negative for nitrites/leukocytes. CXR with no focal consolidation. With her ethanol level of 248, we suspect that she fell due to alcohol use.  Patient's blood cultures no growth today.  And antibiotics were discontinued.  Her lactic acidosis from improved with IV fluids.  On day 2 of hospitalization, patient appeared well and was ready for discharge home.  She did have diffuse ecchymoses present on all extremities, sutures placed on her left lateral malleoli please for a scalp laceration on her right occipital parietal region.  Patient was sent discharged home with oxycodone  and Tylenol .  Alcohol use disorder Patient endorses drinking 3 glasses of wine per day. Ethanol level was 248 on admission. She denies any history of alcohol withdrawal.  She was started on CIWA and did not receive Ativan.  Patient did not have seizures or DT during this hospitalization.  Patient was provided information for local alcohol Anonymous.  She was discharged home with multivitamins, thiamine , folic acid .  Lung nodule 14 mm lung nodule seen in medial right apex. Recommending follow up non-contrast CT at 3 months.    Nonischemic cardiomyopathy Last echo in 2023 with EF of 70-75%, moderate LVH, moderately enlarged RV, normal aortic and mitral valves, and minimal tricuspid regurgitation. On metoprolol  25 mg once daily at home which was continued throughout hospitalization.  Osteoporosis On fosamax 70 mg weekly.    Tobacco use  disorder Smokes ~0.5 ppd x 40 years, and nicotine  patch was used throughout hospitalization.

## 2023-10-25 ENCOUNTER — Encounter: Payer: Self-pay | Admitting: Interventional Cardiology

## 2023-10-25 ENCOUNTER — Encounter (HOSPITAL_COMMUNITY): Payer: Self-pay | Admitting: Internal Medicine

## 2023-10-25 ENCOUNTER — Other Ambulatory Visit (HOSPITAL_COMMUNITY): Payer: Self-pay

## 2023-10-25 DIAGNOSIS — W19XXXA Unspecified fall, initial encounter: Secondary | ICD-10-CM | POA: Diagnosis not present

## 2023-10-25 DIAGNOSIS — F1090 Alcohol use, unspecified, uncomplicated: Secondary | ICD-10-CM

## 2023-10-25 DIAGNOSIS — E872 Acidosis, unspecified: Secondary | ICD-10-CM | POA: Diagnosis not present

## 2023-10-25 DIAGNOSIS — T68XXXA Hypothermia, initial encounter: Secondary | ICD-10-CM | POA: Diagnosis not present

## 2023-10-25 LAB — BASIC METABOLIC PANEL
Anion gap: 12 (ref 5–15)
BUN: 7 mg/dL — ABNORMAL LOW (ref 8–23)
CO2: 20 mmol/L — ABNORMAL LOW (ref 22–32)
Calcium: 8.1 mg/dL — ABNORMAL LOW (ref 8.9–10.3)
Chloride: 101 mmol/L (ref 98–111)
Creatinine, Ser: 0.65 mg/dL (ref 0.44–1.00)
GFR, Estimated: >60 mL/min (ref >60)
Glucose, Bld: 68 mg/dL — ABNORMAL LOW (ref 70–99)
Potassium: 3.4 mmol/L — ABNORMAL LOW (ref 3.5–5.1)
Sodium: 133 mmol/L — ABNORMAL LOW (ref 135–145)

## 2023-10-25 LAB — HIV ANTIBODY (ROUTINE TESTING W REFLEX): HIV Screen 4th Generation wRfx: NONREACTIVE

## 2023-10-25 LAB — CBC
HCT: 33.4 % — ABNORMAL LOW (ref 36.0–46.0)
Hemoglobin: 11.7 g/dL — ABNORMAL LOW (ref 12.0–15.0)
MCH: 38 pg — ABNORMAL HIGH (ref 26.0–34.0)
MCHC: 35 g/dL (ref 30.0–36.0)
MCV: 108.4 fL — ABNORMAL HIGH (ref 80.0–100.0)
Platelets: 228 10*3/uL (ref 150–400)
RBC: 3.08 MIL/uL — ABNORMAL LOW (ref 3.87–5.11)
RDW: 13.8 % (ref 11.5–15.5)
WBC: 12.2 10*3/uL — ABNORMAL HIGH (ref 4.0–10.5)
nRBC: 0 % (ref 0.0–0.2)

## 2023-10-25 MED ORDER — FOLIC ACID 1 MG PO TABS
1.0000 mg | ORAL_TABLET | Freq: Every day | ORAL | 0 refills | Status: DC
  Filled 2023-10-25: qty 30, 30d supply, fill #0

## 2023-10-25 MED ORDER — ACETAMINOPHEN 325 MG PO TABS
650.0000 mg | ORAL_TABLET | Freq: Four times a day (QID) | ORAL | 0 refills | Status: AC | PRN
  Filled 2023-10-25: qty 30, 4d supply, fill #0

## 2023-10-25 MED ORDER — ORAL CARE MOUTH RINSE: 15.0000 mL | OROMUCOSAL | Status: DC | PRN

## 2023-10-25 MED ORDER — OXYCODONE HCL 5 MG PO TABS
5.0000 mg | ORAL_TABLET | Freq: Four times a day (QID) | ORAL | 0 refills | Status: DC | PRN
  Filled 2023-10-25: qty 20, 5d supply, fill #0

## 2023-10-25 MED ORDER — POTASSIUM CHLORIDE CRYS ER 20 MEQ PO TBCR
40.0000 meq | EXTENDED_RELEASE_TABLET | Freq: Once | ORAL | Status: AC
  Administered 2023-10-25: 40 meq via ORAL
  Filled 2023-10-25: qty 2

## 2023-10-25 MED ORDER — ADULT MULTIVITAMIN W/MINERALS CH
1.0000 | ORAL_TABLET | Freq: Every day | ORAL | 0 refills | Status: AC
  Filled 2023-10-25: qty 30, 30d supply, fill #0

## 2023-10-25 MED ORDER — THIAMINE HCL 100 MG PO TABS
100.0000 mg | ORAL_TABLET | Freq: Every day | ORAL | 0 refills | Status: DC
  Filled 2023-10-25: qty 30, 30d supply, fill #0

## 2023-10-25 NOTE — Discharge Instructions (Signed)
 You came to the hospital for altered mental status and you were diagnosed with lactic acidosis and hypothermia.  We treated you with IV fluids.    *For your Fall and Pain -We have started you on these following medications:  -Tylenol  650 mg every 6 hours for moderate pain   -Oxycodone  5 mg every 6 hours for sever pain    Please avoid alcohol use. We encourage you to call your local AA group and join meetings.  -Address: [address] [place], [place] [address] -Phone: [phone]  *There was a lung nodule found on CT imaging, you will need a repeat chest CT in 3 months  Follow-up appointments: You have an appointment with the internal medicine clinic on 01/22 at 9:45 am.  Phone: [phone] Address: [address] [place], [place] [address]    If you have any questions or concerns please feel free to call: Internal medicine clinic at [phone]   If you have any of these following symptoms, please call us  or seek care at an emergency department: -Chest Pain -Difficulty Breathing -Syncope (passing out) -Drooping of face -Slurred speech -Sudden weakness in your leg or arm -Fever -Chills   We are glad that you are feeling better, it was a pleasure to care for you!  Damien Lease DO

## 2023-10-25 NOTE — Evaluation (Signed)
 Physical Therapy Evaluation Patient Details Name: Gina Cherry MRN: [id number] DOB: November 16, 1955 Today's Date: 10/25/2023  History of Present Illness  68 y.o. female presents to [facility] 10/23/22 hypothermic and altered after an unwitnessed fall in the driveway. Admitted w/ lactic acidosis, on CIWA protocol. PMHx: nonischemic cardiomyopathy, osteoporosis, alcohol use disorder,   Clinical Impression  Pt in bed upon arrival with significant other present and agreeable to PT eval. Prior to admit, pt was independent with no AD. In today's session, pt was able to ambulate ~40 ft with supervision and RW. This is the distance pt reports she will need to do at home. Pt has decreased WB on L LE due to pain, however, had no LOB or unsteadiness. Pt presents to therapy session with decreased L LE strength/ROM, balance, and mobility 2/2 pain. Pt would benefit from acute skilled PT to address functional impairments. Anticipating no follow up PT needs. Acute PT to follow.          If plan is discharge home, recommend the following: A little help with walking and/or transfers;Assist for transportation;Help with stairs or ramp for entrance;A little help with bathing/dressing/bathroom   Can travel by private vehicle    Yes    Equipment Recommendations None recommended by PT     Functional Status Assessment Patient has had a recent decline in their functional status and demonstrates the ability to make significant improvements in function in a reasonable and predictable amount of time.     Precautions / Restrictions Precautions Precautions: Fall Restrictions Weight Bearing Restrictions Per Provider Order: No      Mobility  Bed Mobility Overal bed mobility: Modified Independent      General bed mobility comments: HOB elevated    Transfers Overall transfer level: Needs assistance Equipment used: Rolling walker (2 wheels) Transfers: Sit to/from Stand, Bed to chair/wheelchair/BSC Sit to Stand:  Supervision   Step pivot transfers: Supervision      General transfer comment: supervison for safety, cues for hand placement    Ambulation/Gait Ambulation/Gait assistance: Supervision Gait Distance (Feet): 40 Feet Assistive device: Rolling walker (2 wheels) Gait Pattern/deviations: Step-to pattern, Decreased stance time - left, Decreased dorsiflexion - left Gait velocity: decr     General Gait Details: decreased WB on L LE due to pain, step to gait pattern     Balance Overall balance assessment: Needs assistance, History of Falls, Mild deficits observed, not formally tested Sitting-balance support: No upper extremity supported, Feet supported Sitting balance-Leahy Scale: Good     Standing balance support: Bilateral upper extremity supported, Reliant on assistive device for balance, During functional activity Standing balance-Leahy Scale: Poor Standing balance comment: reliant on RW       Pertinent Vitals/Pain Pain Assessment Pain Assessment: Faces Faces Pain Scale: Hurts even more Pain Location: L ankle Pain Descriptors / Indicators: Aching, Discomfort Pain Intervention(s): Monitored during session, Limited activity within patient's tolerance, Repositioned    Home Living Family/patient expects to be discharged to:: Private residence Living Arrangements: Spouse/significant other Available Help at Discharge: Family;Available 24 hours/day Type of Home: House Home Access: Stairs to enter Entrance Stairs-Rails:  (2 posts, pt holds onto) Secretary/administrator of Steps: 2   Home Layout: One level Home Equipment: Agricultural Consultant (2 wheels);BSC/3in1;Grab bars - tub/shower      Prior Function Prior Level of Function : Independent/Modified Independent;History of Falls (last six months);Driving   Mobility Comments: no AD ADLs Comments: driving     Extremity/Trunk Assessment   Upper Extremity Assessment Upper Extremity Assessment: Defer  to OT evaluation    Lower  Extremity Assessment Lower Extremity Assessment: LLE deficits/detail (bruising and serosanginous blood on B foot bandages) LLE Deficits / Details: painful w/ WB around lateral malleolus    Cervical / Trunk Assessment Cervical / Trunk Assessment: Normal  Communication   Communication Communication: No apparent difficulties  Cognition Arousal: Alert Behavior During Therapy: WFL for tasks assessed/performed Overall Cognitive Status: Within Functional Limits for tasks assessed     General Comments General comments (skin integrity, edema, etc.): VSS on RA, serosanguinous blood on B foot dressings, RN notified     PT Assessment Patient needs continued PT services  PT Problem List Decreased strength;Decreased range of motion;Decreased activity tolerance;Decreased balance;Decreased mobility       PT Treatment Interventions DME instruction;Gait training;Stair training;Functional mobility training;Therapeutic activities;Therapeutic exercise;Balance training;Neuromuscular re-education;Patient/family education    PT Goals (Current goals can be found in the Care Plan section)  Acute Rehab PT Goals Patient Stated Goal: to get better PT Goal Formulation: With patient/family Time For Goal Achievement: 11/08/23 Potential to Achieve Goals: Good    Frequency Min 1X/week        AM-PAC PT 6 Clicks Mobility  Outcome Measure Help needed turning from your back to your side while in a flat bed without using bedrails?: None Help needed moving from lying on your back to sitting on the side of a flat bed without using bedrails?: None Help needed moving to and from a bed to a chair (including a wheelchair)?: A Little Help needed standing up from a chair using your arms (e.g., wheelchair or bedside chair)?: A Little Help needed to walk in hospital room?: A Little Help needed climbing 3-5 steps with a railing? : A Little 6 Click Score: 20    End of Session Equipment Utilized During Treatment:  Gait belt Activity Tolerance: Patient tolerated treatment well Patient left: in bed;with call bell/phone within reach;with family/visitor present Nurse Communication: Mobility status PT Visit Diagnosis: Unsteadiness on feet (R26.81);History of falling (Z91.81)    Time: 9164-9144 PT Time Calculation (min) (ACUTE ONLY): 20 min   Charges:   PT Evaluation $PT Eval Low Complexity: 1 Low   PT General Charges $$ ACUTE PT VISIT: 1 Visit         Kate ORN, PT, DPT Secure Chat Preferred  Rehab Office [phone]   Kate BRAVO Wendolyn 10/25/2023, 9:03 AM

## 2023-10-25 NOTE — Evaluation (Signed)
 Occupational Therapy Evaluation Patient Details Name: Gina Cherry MRN: [id number] DOB: 01-01-1956 Today's Date: 10/25/2023   History of Present Illness 68 y.o. female presents to [facility] 10/23/22 hypothermic and altered after an unwitnessed fall in the driveway. Admitted w/ lactic acidosis, on CIWA protocol. PMHx: nonischemic cardiomyopathy, osteoporosis, alcohol use disorder,   Clinical Impression   Patient admitted for the diagnosis above.  PTA she lives at home with her SO, who is able to assist as needed.  Currently she is having ongoing discomfort to her ankle, needing CGA for lower body ADL and simple transfers at RW level.  Patient has the needed DME at home, and the SO can assist as needed.  No further OT needs in the acute setting.  No post acute OT anticipated.         If plan is discharge home, recommend the following: Assist for transportation    Functional Status Assessment  Patient has had a recent decline in their functional status and demonstrates the ability to make significant improvements in function in a reasonable and predictable amount of time.  Equipment Recommendations  None recommended by OT;Other (comment) (Patient going to order a shower chair)    Recommendations for Other Services       Precautions / Restrictions Precautions Precautions: Fall Restrictions Weight Bearing Restrictions Per Provider Order: No      Mobility Bed Mobility Overal bed mobility: Modified Independent                  Transfers Overall transfer level: Needs assistance Equipment used: Rolling walker (2 wheels) Transfers: Sit to/from Stand, Bed to chair/wheelchair/BSC Sit to Stand: Supervision, Contact guard assist     Step pivot transfers: Supervision, Contact guard assist            Balance Overall balance assessment: Needs assistance Sitting-balance support: No upper extremity supported, Feet supported Sitting balance-Leahy Scale: Good     Standing  balance support: Reliant on assistive device for balance Standing balance-Leahy Scale: Poor                             ADL either performed or assessed with clinical judgement   ADL                       Lower Body Dressing: Contact guard assist;Sit to/from stand   Toilet Transfer: Contact guard assist;BSC/3in1;Rolling walker (2 wheels)                   Vision Patient Visual Report: No change from baseline       Perception Perception: Not tested       Praxis Praxis: Not tested       Pertinent Vitals/Pain Pain Assessment Pain Assessment: Faces Faces Pain Scale: Hurts little more Pain Location: L ankle Pain Descriptors / Indicators: Grimacing Pain Intervention(s): Monitored during session     Extremity/Trunk Assessment Upper Extremity Assessment Upper Extremity Assessment: Overall WFL for tasks assessed;RUE deficits/detail;LUE deficits/detail RUE Deficits / Details: abrasions to elbow, bandaged RUE Sensation: WNL RUE Coordination: WNL LUE Deficits / Details: abrasions to elbow, bandaged LUE Sensation: WNL LUE Coordination: WNL   Lower Extremity Assessment Lower Extremity Assessment: Defer to PT evaluation LLE Deficits / Details: painful w/ WB around lateral malleolus   Cervical / Trunk Assessment Cervical / Trunk Assessment: Normal   Communication Communication Communication: No apparent difficulties   Cognition Arousal: Alert Behavior During Therapy: [facility] for  tasks assessed/performed Overall Cognitive Status: Within Functional Limits for tasks assessed                                       General Comments  VSS on RA    Exercises     Shoulder Instructions      Home Living Family/patient expects to be discharged to:: Private residence Living Arrangements: Spouse/significant other Available Help at Discharge: Family;Available 24 hours/day Type of Home: House Home Access: Stairs to enter [employer] of Steps: 2 Entrance Stairs-Rails:  (2 posts, pt holds onto) Home Layout: One level     Bathroom Shower/Tub: Chief Strategy Officer: Handicapped height Bathroom Accessibility: Yes   Home Equipment: Agricultural Consultant (2 wheels);BSC/3in1;Grab bars - tub/shower          Prior Functioning/Environment Prior Level of Function : Independent/Modified Independent;History of Falls (last six months);Driving             Mobility Comments: no AD ADLs Comments: Ind        OT Problem List: Pain      OT Treatment/Interventions:      OT Goals(Current goals can be found in the care plan section) Acute Rehab OT Goals Patient Stated Goal: Return home OT Goal Formulation: With patient Time For Goal Achievement: 11/01/23 Potential to Achieve Goals: Good  OT Frequency:      Co-evaluation              AM-PAC OT 6 Clicks Daily Activity     Outcome Measure Help from another person eating meals?: None Help from another person taking care of personal grooming?: None Help from another person toileting, which includes using toliet, bedpan, or urinal?: A Little Help from another person bathing (including washing, rinsing, drying)?: A Little Help from another person to put on and taking off regular upper body clothing?: None Help from another person to put on and taking off regular lower body clothing?: A Little 6 Click Score: 21   End of Session Equipment Utilized During Treatment: Rolling walker (2 wheels) Nurse Communication: Mobility status  Activity Tolerance: Patient tolerated treatment well Patient left: in bed;with call bell/phone within reach;with family/visitor present  OT Visit Diagnosis: Unsteadiness on feet (R26.81)                Time: 1050-1107 OT Time Calculation (min): 17 min Charges:  OT General Charges $OT Visit: 1 Visit OT Evaluation $OT Eval Moderate Complexity: 1 Mod  10/25/2023  RP, OTR/L  Acute Rehabilitation  Services  Office:  [phone]   Gina Cherry 10/25/2023, 11:12 AM

## 2023-10-25 NOTE — Care Management CC44 (Deleted)
 Condition Code 44 Documentation Completed  Patient Details  Name: Gina Cherry MRN: [id number] Date of Birth: 1956/09/24  Explained observation letter to patient and husband . Both voiced understanding. Hard copy given   Condition Code 44 given:    Patient signature on Condition Code 44 notice:    Documentation of 2 MD's agreement:    Code 44 added to claim:       Stephane Powell Jansky, RN 10/25/2023, 10:39 AM

## 2023-10-25 NOTE — Plan of Care (Signed)
 Foley removed after contacting provider. Pt was able to void without difficulty post foley removal. R ankle wound oozing serosanguinous drainage. Would appreciate recommendation from provider on dressing change and frequency.    Problem: Safety: Goal: Non-violent Restraint(s) Outcome: Progressing   Problem: Clinical Measurements: Goal: Ability to maintain clinical measurements within normal limits will improve Outcome: Progressing   Problem: Nutrition: Goal: Adequate nutrition will be maintained Outcome: Progressing   Problem: Pain Management: Goal: General experience of comfort will improve Outcome: Progressing   Problem: Safety: Goal: Ability to remain free from injury will improve Outcome: Progressing   Problem: Skin Integrity: Goal: Risk for impaired skin integrity will decrease Outcome: Progressing

## 2023-10-25 NOTE — TOC Initial Note (Signed)
 Transition of Care (TOC) - Initial/Assessment Note   Spoke to patient and significant other Garrel at bedside.   Patient from home with Garrel. Both are retired and Garrel can assist at home.   Confirmed face sheet information.      Transition of Care Department [facility]) has reviewed patient and no TOC needs have been identified at this time. We will continue to monitor patient advancement through interdisciplinary progression rounds. If new patient transition needs arise, please place a TOC consult.   Patient Details  Name: Gina Cherry MRN: [id number] Date of Birth: [birth year]  Transition of [facility]) CM/SW Contact:    Stephane Powell Jansky, RN Phone Number: 10/25/2023, 10:46 AM  Clinical Narrative:                   Expected Discharge Plan: Home/Self Care Barriers to Discharge: Continued Medical Work up   Patient Goals and CMS Choice Patient states their goals for this hospitalization and ongoing recovery are:: to return to home          Expected Discharge Plan and Services   Discharge Planning Services: CM Consult Post Acute Care Choice: NA Living arrangements for the past 2 months: Single Family Home                 DME Arranged: N/A DME Agency: NA       HH Arranged: NA          Prior Living Arrangements/Services Living arrangements for the past 2 months: Single Family Home Lives with:: Significant Other Patient language and need for interpreter reviewed:: Yes Do you feel safe going back to the place where you live?: Yes      Need for Family Participation in Patient Care: Yes (Comment) Care giver support system in place?: Yes (comment)   Criminal Activity/Legal Involvement Pertinent to Current Situation/Hospitalization: No - Comment as needed  Activities of Daily Living   ADL Screening (condition at time of admission) Independently performs ADLs?: No Does the patient have a NEW difficulty with bathing/dressing/toileting/self-feeding that is expected  to last >3 days?: No Does the patient have a NEW difficulty with getting in/out of bed, walking, or climbing stairs that is expected to last >3 days?: Yes (Initiates electronic notice to provider for possible PT consult) Does the patient have a NEW difficulty with communication that is expected to last >3 days?: No Is the patient deaf or have difficulty hearing?: No Does the patient have difficulty seeing, even when wearing glasses/contacts?: No Does the patient have difficulty concentrating, remembering, or making decisions?: No  Permission Sought/Granted   Permission granted to share information with : Yes, Verbal Permission Granted  Share Information with NAME: significant other Garrel           Emotional Assessment Appearance:: Appears stated age Attitude/Demeanor/Rapport: Engaged Affect (typically observed): Accepting Orientation: : Oriented to Self, Oriented to Place, Oriented to  Time, Oriented to Situation   Psych Involvement: No (comment)  Admission diagnosis:  Lactic acidosis [E87.20] Lung nodule [R91.1] Hypothermia, initial encounter [T68.XXXA] Fall, initial encounter [id number]] Patient Active Problem List   Diagnosis Date Noted   Lactic acidosis 10/24/2023   Fall 10/24/2023   Hypothermia 10/24/2023   PCP:  Rox Charleston, MD Pharmacy:   [facility] [phone] - [place], [place] - [address] AT [facility] [address] & [facility] [address] [place] [address] Phone: [phone] Fax: [phone]     Social Drivers of Health (SDOH) Social History: SDOH Screenings  Food Insecurity: No Food Insecurity (10/24/2023)  Housing: Low Risk  (10/25/2023)  Transportation Needs: No Transportation Needs (10/24/2023)  Utilities: Not At Risk (10/24/2023)  Social Connections: Unknown (10/24/2023)  Tobacco Use: High Risk (10/25/2023)   SDOH Interventions: Food Insecurity Interventions: Intervention Not Indicated Housing Interventions: Intervention Not  Indicated Transportation Interventions: Intervention Not Indicated Utilities Interventions: Intervention Not Indicated Social Connections Interventions: Intervention Not Indicated   Readmission Risk Interventions     No data to display

## 2023-10-25 NOTE — Discharge Summary (Signed)
 Name: NAMI STRAWDER MRN: [id number] DOB: 1956/10/04 68 y.o. PCP: Rox Charleston, MD  Date of Admission: 10/24/2023  5:21 AM Date of Discharge: 10/25/2023 2:15 PM Attending Physician: Dr. CHARLENA Eastern  Discharge Diagnosis: Principal Problem:   Lactic acidosis Active Problems:   Fall   Hypothermia    Discharge Medications: Allergies as of 10/25/2023       Reactions   Codeine    unknown   Codeine Other (See Comments)   Unknown reaction (possibly sick stomach)        Medication List     TAKE these medications    acetaminophen  325 MG tablet Commonly known as: TYLENOL  Take 2 tablets (650 mg total) by mouth every 6 (six) hours as needed for mild pain (pain score 1-3) (or Fever >/= 101).   alendronate 70 MG tablet Commonly known as: FOSAMAX Take 70 mg by mouth once a week. Saturday 10/20/23 last dose.   CertaVite/Antioxidants Tabs Take 1 tablet by mouth daily.   folic acid  1 MG tablet Commonly known as: FOLVITE  Take 1 tablet (1 mg total) by mouth daily.   metoprolol  tartrate 25 MG tablet Commonly known as: LOPRESSOR  Take 25 mg by mouth 2 (two) times daily.   oxyCODONE  5 MG immediate release tablet Commonly known as: Oxy IR/ROXICODONE  Take 1 tablet (5 mg total) by mouth every 6 (six) hours as needed for severe pain (pain score 7-10).   thiamine  100 MG tablet Commonly known as: VITAMIN B1 Take 1 tablet (100 mg total) by mouth daily.               Discharge Care Instructions  (From admission, onward)           Start     Ordered   10/25/23 0000  Change dressing (specify)       Comments: Dressing change: as needed   10/25/23 1147            Disposition and follow-up:   Ms.Koral E Woodcox was discharged from [facility] in Stable condition.  At the hospital follow up visit please address:  1.  Follow-up:  *Injuries due to fall -Please remove staples on scalp and left ankle sutures at the time of follow-up -Please assess  patient's pain level during this appointment -Please assess patient's wounds and ensure that there are no infections  *Alcohol use -Note that we provided patient with local AA patient -Assess patient's alcohol use  *14 mm lung nodule in the medial right apex -Patient will need a noncontrast chest CT in 3 months, PET/CT, or tissue sampling (per radiology read)  *Aortic atherosclerosis -Obtain lipid panel and begin treatment if patient consents   *Tobacco use disorder -Provide patient with cessation resources if she is interested.  *Evidence of pelvic floor laxity with left of midline rectal prolapse.  2.  Labs / imaging needed at time of follow-up: Lipid profile  3.  Pending labs/ test needing follow-up: Blood cultures, preliminary result is no growth to date  4.  Medication Changes  STOPPED  -N/A   ADDED  -Oxycodone  5 mg every 6 hours prn , 5-day course  -Tylenol   -Folic acid , thiamine , multivitamin   MODIFIED  -N/A   Follow-up Appointments:  Follow-up Information     Kandis Perkins, DO Follow up.   Specialty: Internal Medicine Contact information: [address] [place] [address] [phone]                 Hospital Course by  problem list:  Lactic acidosis  Fall vs syncope Patient presented after being found down outside on driveway. She met SIRS criteria with initial temp of 82.83F (improved to normal now), and RR in the 20s. HR and BP within normal range. Lactic acid level initially elevated to 8.2, but is down-trending to 5.7 after receving IVF. CK initially 969. She also received empiric vanc, flagyl , and cefepime , however, there was not a clear source of infection and her labs/vitals can be explained by being found down outside. U/A negative for nitrites/leukocytes. CXR with no focal consolidation. With her ethanol level of 248, we suspect that she fell due to alcohol use.  Patient's blood cultures no growth today.  And antibiotics were discontinued.   Her lactic acidosis from improved with IV fluids.  On day 2 of hospitalization, patient appeared well and was ready for discharge home.  She did have diffuse ecchymoses present on all extremities, sutures placed on her left lateral malleoli please for a scalp laceration on her right occipital parietal region.  Patient was sent discharged home with oxycodone  and Tylenol .  Alcohol use disorder Patient endorses drinking 3 glasses of wine per day. Ethanol level was 248 on admission. She denies any history of alcohol withdrawal.  She was started on CIWA and did not receive Ativan.  Patient did not have seizures or DT during this hospitalization.  Patient was provided information for local alcohol Anonymous.  She was discharged home with multivitamins, thiamine , folic acid .  Lung nodule 14 mm lung nodule seen in medial right apex. Recommending follow up non-contrast CT at 3 months.    Nonischemic cardiomyopathy Last echo in 2023 with EF of 70-75%, moderate LVH, moderately enlarged RV, normal aortic and mitral valves, and minimal tricuspid regurgitation. On metoprolol  25 mg once daily at home which was continued throughout hospitalization.  Osteoporosis On fosamax 70 mg weekly.    Tobacco use disorder Smokes ~0.5 ppd x 40 years, and nicotine  patch was used throughout hospitalization.    Discharge Subjective: Patient reports moderate pain of her left ankle.  She is concerned that her ankle has a lot of drainage.  She denies other acute complaints at this time.  Patient was educated on alcohol cessation, discharge instructions, and the right apex lung nodule.  Patient would like to establish with us  for PCP.  Discharge Exam:   Blood pressure 130/66, pulse (!) 106, temperature 99 F (37.2 C), temperature source Oral, resp. rate 19, height 5' (1.524 m), weight 40.8 kg, SpO2 98%.  Constitutional: Elderly appearing female, laying in bed, in no acute distress HENT: Staples present on right  occipital/parietal region, no drainage appreciated Cardiovascular: regular rate and rhythm Pulmonary/Chest: normal work of breathing on room air Neurological: alert & oriented x 3 MSK: Numerous ecchymoses present on all extremities.  Left lateral malleoli has large laceration with sutures in place.  Right anterior lower leg has large abrasion with dressing in place.  Right dorsal forearm has ecchymoses present. Skin: warm and dry Psych: Normal mood and affect  Pertinent Labs, Studies, and Procedures:     Latest Ref Rng & Units 10/25/2023   12:04 PM 10/24/2023    8:53 AM 10/24/2023    5:46 AM  CBC  WBC 4.0 - 10.5 K/uL 12.2   13.6   Hemoglobin 12.0 - 15.0 g/dL 88.2  9.5  84.7   Hematocrit 36.0 - 46.0 % 33.4  28.0  44.7   Platelets 150 - 400 K/uL 228   268  Latest Ref Rng & Units 10/25/2023    5:01 AM 10/24/2023    8:56 AM 10/24/2023    8:53 AM  CMP  Glucose 70 - 99 mg/dL 68  80  75   BUN 8 - 23 mg/dL 7  11  12    Creatinine 0.44 - 1.00 mg/dL 9.34  9.46  9.39   Sodium 135 - 145 mmol/L 133  140  141   Potassium 3.5 - 5.1 mmol/L 3.4  4.5  4.6   Chloride 98 - 111 mmol/L 101  109  107   CO2 22 - 32 mmol/L 20  16    Calcium  8.9 - 10.3 mg/dL 8.1  7.5    Total Protein 6.5 - 8.1 g/dL  4.3    Total Bilirubin 0.0 - 1.2 mg/dL  0.7    Alkaline Phos 38 - 126 U/L  38    AST 15 - 41 U/L  75    ALT 0 - 44 U/L  36      DG Ankle Left Port Result Date: 10/24/2023 CLINICAL DATA:  68 year old female found down outside in driveway. Combative. EXAM: PORTABLE LEFT ANKLE - 2 VIEW COMPARISON:  Left foot series today. FINDINGS: 2 portable views at 0542 hours. Bone mineralization is within normal limits. Maintained left mortise joint alignment. No evidence of joint effusion on the lateral view. Distal tibia, fibula, talus and calcaneus appear intact. Some calcified peripheral vascular disease. Sock artifact at the left foot. IMPRESSION: No acute fracture or dislocation identified about the left ankle.  Electronically Signed   By: VEAR Hurst M.D.   On: 10/24/2023 06:58   DG Ankle Right Port Result Date: 10/24/2023 CLINICAL DATA:  68 year old female found down outside in driveway. Combative. EXAM: PORTABLE RIGHT ANKLE - 2 VIEW COMPARISON:  Right foot series today. FINDINGS: 2 portable views at 0542 hours. Bone mineralization is within normal limits. Maintained mortise joint alignment. No evidence of right ankle joint effusion. Distal tibia, fibula, talus and calcaneus appear intact. No acute osseous abnormality identified. Some calcified peripheral vascular disease. Sock/clothing artifact at the foot. IMPRESSION: No acute fracture or dislocation identified about the right ankle. Electronically Signed   By: VEAR Hurst M.D.   On: 10/24/2023 06:57   DG Knee Right Port Result Date: 10/24/2023 CLINICAL DATA:  68 year old female found down outside in driveway. Combative. EXAM: PORTABLE RIGHT KNEE - 1-2 VIEW COMPARISON:  None Available. FINDINGS: Portable AP and cross-table lateral views at 0545 hours. Some calcified peripheral vascular disease. Mild medial compartment joint space loss. Otherwise normal joint spaces and alignment at the right knee. Patella intact. No evidence of joint effusion. No acute osseous abnormality identified. IMPRESSION: No acute fracture or dislocation identified about the right knee. Electronically Signed   By: VEAR Hurst M.D.   On: 10/24/2023 06:57   DG Foot Complete Right Result Date: 10/24/2023 CLINICAL DATA:  68 year old female found down outside in driveway. Combative. EXAM: RIGHT FOOT COMPLETE - 3+ VIEW COMPARISON:  None Available. FINDINGS: Two portable views at 0541 hours. 2nd toe ring artifact. Bone mineralization is within normal limits. Some calcified peripheral vascular disease. No acute osseous abnormality identified. 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 No discrete soft tissue injury. IMPRESSION: No acute fracture or dislocation identified about the right foot. Electronically Signed    By: VEAR Hurst M.D.   On: 10/24/2023 06:56   DG Foot Complete Left Result Date: 10/24/2023 CLINICAL DATA:  68 year old female found down outside in driveway. Combative. EXAM: LEFT FOOT -  COMPLETE 3+ VIEW COMPARISON:  None Available. FINDINGS: Two portable views at 0542 hours. 2nd and 3rd toe ring artifact. Bone mineralization is within normal limits. Some calcified peripheral vascular disease. No acute osseous abnormality identified. Sock/clothing artifact limits detail of the soft tissues. IMPRESSION: No acute fracture or dislocation identified about the left foot. Electronically Signed   By: VEAR Hurst M.D.   On: 10/24/2023 06:55   CT CHEST ABDOMEN PELVIS WO CONTRAST Result Date: 10/24/2023 CLINICAL DATA:  68 year old female found down outside in driveway. Combative. EXAM: CT CHEST, ABDOMEN AND PELVIS WITHOUT CONTRAST TECHNIQUE: Multidetector CT imaging of the chest, abdomen and pelvis was performed following the standard protocol without IV contrast. RADIATION DOSE REDUCTION: This exam was performed according to the departmental dose-optimization program which includes automated exposure control, adjustment of the mA and/or kV according to patient size and/or use of iterative reconstruction technique. COMPARISON:  Trauma series radiographs today. CT cervical spine today. Chest radiographs in 2023. FINDINGS: CT CHEST FINDINGS Cardiovascular: Noncontrast exam. Calcified aortic atherosclerosis. Calcified coronary artery atherosclerosis. Normal heart size. No pericardial effusion. Vascular patency is not evaluated in the absence of IV contrast. Mediastinum/Nodes: Negative noncontrast mediastinum, no hematoma, mass, or lymphadenopathy identified. Lungs/Pleura: Centrilobular emphysema. Major airways are patent with trace layering secretions in the right bronchus intermedius on series 5, image 100. No pneumothorax. No pleural effusion. Curvilinear, platelike, and partially nodular and calcified opacity in the right lower  lobe most resembles postinflammatory scarring, with right lung base opacification in 2023. Nearby small calcified granuloma in the medial basal segment on series 5, image 140. No pulmonary contusion identified. However, indeterminate appearing 14 mm medial right apical solid lung nodule also on series 6, image 40, series 5, image 45. Musculoskeletal: Less motion artifact in the lower cervical spine at the C7 level. Cervicothoracic junction alignment is within normal limits. Maintained thoracic vertebral height. Chronic thoracic disc and endplate degeneration primarily in the midthoracic spine. No thoracic vertebral fracture identified. No sternal fracture identified. Visible shoulder osseous structures appear grossly intact with mild motion artifact. Intermittent rib motion artifact. No acute rib fracture is identified. CT ABDOMEN PELVIS FINDINGS Hepatobiliary: Mild motion artifact. No perihepatic fluid identified. Negative noncontrast liver and gallbladder. Pancreas: Negative noncontrast pancreas. Spleen: Diminutive.  Mild motion artifact.  No perisplenic fluid. Adrenals/Urinary Tract: Noncontrast adrenal glands and kidneys appear symmetric and within normal limits. Foley catheter decompresses the urinary bladder. Stomach/Bowel: No dilated large or small bowel. No free air or free fluid identified. Limited bowel detail due to motion. Small volume residual fluid in the stomach. No obvious bowel inflammation. But there is evidence of pelvic floor laxity and a left of midline rectal prolapse as seen on sagittal image 66 and series 3, image 22. No regional inflammation. Vascular/Lymphatic: Extensive Aortoiliac calcified atherosclerosis. Vascular patency is not evaluated in the absence of IV contrast. Normal caliber abdominal aorta. No lymphadenopathy identified. Reproductive: Diminutive or absent uterus, ovaries. Other: No pelvis free fluid. Musculoskeletal: Motion artifact at the L1-L2 level. Chronic appearing L3  superior endplate irregularity. Maintained vertebral height. No acute lumbar fracture is evident. Mild motion artifact also in the pelvis. Sacrum, SI joints, pelvis, and proximal femurs appear grossly intact. No superficial soft tissue injury identified. IMPRESSION: 1. Noncontrast exam intermittently degraded by motion. No acute traumatic injury identified. 2. Emphysema (ICD10-J43.9), with indeterminate 14 mm lung nodule in the medial right apex. Per Fleischner Society Guidelines, consider a non-contrast Chest CT at 3 months, a PET/CT, or tissue sampling (Reference: Radiology. 2017; 284(1):228-43).  Furthermore, partially calcified right lower lobe lung scarring is suspected, and attention is directed on the above follow-up. 3. Evidence of pelvic floor laxity with left of midline rectal prolapse. 4.  Aortic Atherosclerosis (ICD10-I70.0). Electronically Signed   By: VEAR Hurst M.D.   On: 10/24/2023 06:54   DG Pelvis Portable Result Date: 10/24/2023 CLINICAL DATA:  68 year old female found down outside in driveway. Combative. EXAM: PORTABLE PELVIS 1-2 VIEWS COMPARISON:  Abdominal radiographs 11/30/2021. FINDINGS: Portable AP supine view at 0535 hours. Bone mineralization is within normal limits. Femoral heads are normally located. No pelvis fracture identified. Grossly intact proximal femurs. Nonobstructed bowel-gas pattern. Pelvic phleboliths. IMPRESSION: No acute fracture or dislocation identified about the pelvis. Electronically Signed   By: VEAR Hurst M.D.   On: 10/24/2023 06:43   DG Chest Port 1 View Result Date: 10/24/2023 CLINICAL DATA:  68 year old female found down outside in driveway. Combative. EXAM: PORTABLE CHEST 1 VIEW COMPARISON:  Portable chest 12/09/2021 and earlier. FINDINGS: Portable AP supine view at 0534 hours. Extubated on the previous exam. Larger lung volumes and largely cleared bilateral lung base opacity seen in 2023. Plate like atelectasis or scarring now in the right lower lung. External  spring like artifact projects over the right diaphragm. Normal cardiac size and mediastinal contours. Visualized tracheal air column is within normal limits. No pneumothorax, pleural effusion, pulmonary edema or other confluent opacity. Negative visible bowel gas. No acute osseous abnormality identified. IMPRESSION: 1.  No acute traumatic injury identified. 2. Substantially improved lung base ventilation since 2023 with platelike residual atelectasis or scarring on the right. 3. Some external artifact. Electronically Signed   By: VEAR Hurst M.D.   On: 10/24/2023 06:43   CT CERVICAL SPINE WO CONTRAST Result Date: 10/24/2023 CLINICAL DATA:  68 year old female found down outside in driveway. EXAM: CT CERVICAL SPINE WITHOUT CONTRAST TECHNIQUE: Multidetector CT imaging of the cervical spine was performed without intravenous contrast. Multiplanar CT image reconstructions were also generated. RADIATION DOSE REDUCTION: This exam was performed according to the departmental dose-optimization program which includes automated exposure control, adjustment of the mA and/or kV according to patient size and/or use of iterative reconstruction technique. COMPARISON:  Head CT today. FINDINGS: Intermittent motion artifact. Alignment: Straightening of cervical lordosis. Motion artifact limits detail of the cervicothoracic junction, but the bilateral posterior element alignment appears maintained. Skull base and vertebrae: Visualized skull base is intact. No atlanto-occipital dissociation. C1 and C2 appear intact and aligned. Motion artifact is most pronounced in the cervical spine C5 through C7. No cervical vertebral fracture is identified. Soft tissues and spinal canal: No prevertebral fluid or swelling. No visible canal hematoma. Calcified cervical carotid atherosclerosis. Necklace artifact. Disc levels: Widespread cervical spine disc, endplate, facet degeneration. Detail limited due to motion. Upper chest: Significant motion artifact.  Chest CT today is reported separately. IMPRESSION: Motion degraded exam with no acute traumatic injury identified in the cervical spine. Electronically Signed   By: VEAR Hurst M.D.   On: 10/24/2023 06:41   CT HEAD WO CONTRAST Result Date: 10/24/2023 CLINICAL DATA:  67 year old female found down outside in driveway. EXAM: CT HEAD WITHOUT CONTRAST TECHNIQUE: Contiguous axial images were obtained from the base of the skull through the vertex without intravenous contrast. RADIATION DOSE REDUCTION: This exam was performed according to the departmental dose-optimization program which includes automated exposure control, adjustment of the mA and/or kV according to patient size and/or use of iterative reconstruction technique. COMPARISON:  Head CT 11/28/2021. FINDINGS: Brain: Cerebral volume remains normal for age.  No midline shift, ventriculomegaly, mass effect, evidence of mass lesion, intracranial hemorrhage or evidence of cortically based acute infarction. Mild motion artifact along the anterior convexity and at the vertex. Gray-white differentiation appears stable and within normal limits for age. Vascular: Calcified atherosclerosis at the skull base. No suspicious intracranial vascular hyperdensity. Skull: Motion artifact. No acute osseous abnormality identified; a linear lucency on coronal image 59 is identified on sagittal image 9 and unchanged since 2023 (vascular channel). Sinuses/Orbits: Visualized paranasal sinuses and mastoids are stable and well aerated. Other: Motion artifact. Broad-based posterior convexity scalp hematoma or contusion series 4, image 71. No scalp soft tissue gas. Underlying calvarium appears intact. Postoperative changes to both globes since 2023, orbits soft tissues otherwise negative. IMPRESSION: 1. Motion artifact. 2. Posterior convexity scalp hematoma. No skull fracture identified. 3. Stable and negative non contrast CT appearance of the brain. Electronically Signed   By: VEAR Hurst M.D.    On: 10/24/2023 06:39     Discharge Instructions: Discharge Instructions     Call MD for:  difficulty breathing, headache or visual disturbances   Complete by: As directed    Call MD for:  extreme fatigue   Complete by: As directed    Call MD for:  hives   Complete by: As directed    Call MD for:  persistant dizziness or light-headedness   Complete by: As directed    Call MD for:  persistant nausea and vomiting   Complete by: As directed    Call MD for:  redness, tenderness, or signs of infection (pain, swelling, redness, odor or green/yellow discharge around incision site)   Complete by: As directed    Call MD for:  severe uncontrolled pain   Complete by: As directed    Call MD for:  temperature >100.4   Complete by: As directed    Change dressing (specify)   Complete by: As directed    Dressing change: as needed   Diet - low sodium heart healthy   Complete by: As directed    Discharge instructions   Complete by: As directed    You came to the hospital for altered mental status and you were diagnosed with lactic acidosis and hypothermia.  We treated you with IV fluids.    *For your Fall and Pain -We have started you on these following medications:  -Tylenol  650 mg every 6 hours for moderate pain   -Oxycodone  5 mg every 6 hours for sever pain    Please avoid alcohol use. We encourage you to call your local AA group and join meetings.  -Address: [address], [place], [place] [address] -Phone: [phone]  *There was a lung nodule found on CT imaging, you will need a repeat chest CT in 3 months  Follow-up appointments: You have an appointment with the internal medicine clinic on 01/22 at 9:45 am.  Phone: [phone] Address: [address] [place], [place] [address]    If you have any questions or concerns please feel free to call: Internal medicine clinic at [phone]   If you have any of these following symptoms, please call us  or seek care at an emergency  department: -Chest Pain -Difficulty Breathing -Syncope (passing out) -Drooping of face -Slurred speech -Sudden weakness in your leg or arm -Fever -Chills   We are glad that you are feeling better, it was a pleasure to care for you!  Damien Lease DO   Increase activity slowly   Complete by: As directed  Signed: Damien Lease DO [facility] - PGY1 Pager: [phone] 10/25/2023, 2:15 PM    Please contact the on call pager after 5 pm and on weekends at [phone].

## 2023-10-25 NOTE — Care Management Obs Status (Signed)
 MEDICARE OBSERVATION STATUS NOTIFICATION   Patient Details  Name: Gina Cherry MRN: [id number] Date of Birth: 1955-11-06  Explained medicare observation letter to patient and significant other at bedside. Both voiced understanding. Hard copy given  Medicare Observation Status Notification Given:  Yes    Stephane Powell Jansky, RN 10/25/2023, 10:42 AM

## 2023-10-29 ENCOUNTER — Telehealth: Payer: Self-pay

## 2023-10-29 ENCOUNTER — Telehealth: Payer: Self-pay | Admitting: Student

## 2023-10-29 LAB — CULTURE, BLOOD (ROUTINE X 2)
Culture: NO GROWTH
Culture: NO GROWTH

## 2023-10-29 NOTE — Transitions of Care (Post Inpatient/ED Visit) (Signed)
 10/29/2023  Name: Gina Cherry MRN: [id number] DOB: 29-Dec-1955  Today's TOC FU Call Status: Today's TOC FU Call Status:: Successful TOC FU Call Completed TOC FU Call Complete Date: 10/29/23 Patient's Name and Date of Birth confirmed.  Transition Care Management Follow-up Telephone Call Date of Discharge: 10/25/23 Discharge Facility: [facility]) Type of Discharge: Emergency Department Reason for ED Visit: Other: (fall) How have you been since you were released from the hospital?: Better Any questions or concerns?: No  Items Reviewed: Did you receive and understand the discharge instructions provided?: Yes Medications obtained,verified, and reconciled?: Yes (Medications Reviewed) Any new allergies since your discharge?: No Dietary orders reviewed?: Yes Do you have support at home?: Yes People in Home: significant other  Medications Reviewed Today: Medications Reviewed Today     Reviewed by Emmitt Pan, LPN (Licensed Practical Nurse) on 10/29/23 at 1200  Med List Status: <None>   Medication Order Taking? Sig Documenting Provider Last Dose Status Informant  acetaminophen  (TYLENOL ) 325 MG tablet [id number]  Take 2 tablets (650 mg total) by mouth every 6 (six) hours as needed for mild pain (pain score 1-3) (or Fever >/= 101). Kandis Perkins, DO  Active   acetaminophen  (TYLENOL ) 500 MG tablet [id number] No Take 500 mg by mouth every 6 (six) hours as needed for headache (pain). [provider] Taking Active Multiple Informants  alendronate (FOSAMAX) 70 MG tablet [id number] No TAKE 1 TABLET BY MOUTH 1 TIME A WEEK EARLY IN THE MORNING. DO NOT LIE DOWN OR HAVE ANYTHING BUT WATER  TO DRINK FOR 30 MINUTES [provider] Taking Active   alendronate (FOSAMAX) 70 MG tablet [id number] No Take 70 mg by mouth once a week. Saturday 10/20/23 last dose. [provider] Past Week Active Self  aspirin  81 MG chewable tablet [id number] No Chew 1 tablet (81 mg total) by  mouth daily. Maurice Sharlet RAMAN, PA-C Taking Active   fluticasone  furoate-vilanterol (BREO ELLIPTA ) 200-25 MCG/ACT AEPB [id number] No Inhale 1 puff into the lungs daily. Maurice Sharlet RAMAN, PA-C Taking Active   folic acid  (FOLVITE ) 1 MG tablet [id number]  Take 1 tablet (1 mg total) by mouth daily. Kandis Perkins, DO  Active   metoprolol  tartrate (LOPRESSOR ) 25 MG tablet [id number] No Take 25 mg by mouth daily. [provider] Taking Active   metoprolol  tartrate (LOPRESSOR ) 25 MG tablet [id number] No Take 25 mg by mouth 2 (two) times daily. [provider] 10/23/2023 Active Self  Multiple Vitamin (MULTIVITAMIN WITH MINERALS) TABS tablet [id number] No Take 1 tablet by mouth at bedtime. Maurice Sharlet RAMAN, PA-C Taking Active   Multiple Vitamin (MULTIVITAMIN WITH MINERALS) TABS tablet [id number]  Take 1 tablet by mouth daily. Kandis Perkins, DO  Active   oxyCODONE  (OXY IR/ROXICODONE ) 5 MG immediate release tablet [id number]  Take 1 tablet (5 mg total) by mouth every 6 (six) hours as needed for severe pain (pain score 7-10). Kandis Perkins, DO  Active   Pramoxine-HC (HYDROCORTISONE ACE-PRAMOXINE) 2.5-1 % CREA [id number] No Apply 1 application topically 2 (two) times daily as needed (hemorrhoids). [provider] Taking Active Multiple Informants  PREVIDENT 5000 BOOSTER PLUS 1.1 % PSTE [id number] No Place onto teeth as directed. [provider] Taking Active   spironolactone  (ALDACTONE ) 25 MG tablet [id number] No Take 0.5 tablets (12.5 mg total) by mouth daily.  Patient taking differently: Take 12.5 mg by mouth daily. Per patient haven't had to use.   Lorilee Sven SQUIBB, MD Taking Active   thiamine  (VITAMIN B1) 100 MG tablet  [id number]  Take 1 tablet (100 mg total) by mouth daily. Kandis Perkins, DO  Active             Home Care and Equipment/Supplies: Were Home Health Services Ordered?: NA Any new equipment or medical supplies ordered?: NA  Functional Questionnaire: Do you need assistance  with bathing/showering or dressing?: Yes Do you need assistance with meal preparation?: No Do you need assistance with eating?: No Do you have difficulty maintaining continence: No Do you need assistance with getting out of bed/getting out of a chair/moving?: No Do you have difficulty managing or taking your medications?: No  Follow up appointments reviewed: PCP Follow-up appointment confirmed?: Yes Date of PCP follow-up appointment?: 11/07/23 Follow-up Provider: [facility] Follow-up appointment confirmed?: NA Do you need transportation to your follow-up appointment?: No Do you understand care options if your condition(s) worsen?: Yes-patient verbalized understanding    SIGNATURE Julian Lemmings, LPN [facility] Nurse Health Advisor Direct Dial [phone]

## 2023-10-29 NOTE — Telephone Encounter (Signed)
 Return pt's call. Stated she needs HHN to look at her wounds and change her dressings. She's not an [facility] pt but saw Dr Kandis in the hospital. She has an appt here at [facility] for staple removal of head/ankle incisions. She would like a return call from Dr Kandis. Thanks

## 2023-10-29 NOTE — Telephone Encounter (Signed)
 Pt requesting a Home Health Nurse.  Pt states she was D/C on 10/25/2023 /o a HH Nurse and she is having a lot of trouble with her dressing changes.  Pt is sch for a f/u on 11/06/2022 with [facility] for a HFU but, she  does not wish to wait until then to have a [facility] nurse.  Please call the patient back.

## 2023-10-30 ENCOUNTER — Telehealth: Payer: Self-pay | Admitting: *Deleted

## 2023-10-30 NOTE — Telephone Encounter (Signed)
Will address at visit tomorrow. Thank you.

## 2023-10-30 NOTE — Telephone Encounter (Signed)
 Call from patient requesting a [facility] Nurse for Wound care.  Patient  is new to the Clinics and had an initial appointment scheduled for 11/07/2023.  Patient states needs someone to change her dressings.  States has drainage from and the stitches are oozing.  Right calf is also oozing.  Stitches are oozing and has raw spots.  Friend has been changing but is beginning to be difficult.  Wants to make sure it has not become infected.  Given an appointment for 2:45 PM for assessment of the wounds as well as need for [facility] referral.

## 2023-10-31 ENCOUNTER — Ambulatory Visit (INDEPENDENT_AMBULATORY_CARE_PROVIDER_SITE_OTHER): Payer: Medicare HMO | Admitting: Student

## 2023-10-31 ENCOUNTER — Encounter: Payer: Self-pay | Admitting: Student

## 2023-10-31 VITALS — BP 151/73 | HR 101 | Temp 98.2°F | Ht 60.0 in | Wt 102.0 lb

## 2023-10-31 DIAGNOSIS — E876 Hypokalemia: Secondary | ICD-10-CM | POA: Diagnosis not present

## 2023-10-31 DIAGNOSIS — Z13220 Encounter for screening for lipoid disorders: Secondary | ICD-10-CM | POA: Insufficient documentation

## 2023-10-31 DIAGNOSIS — Z09 Encounter for follow-up examination after completed treatment for conditions other than malignant neoplasm: Secondary | ICD-10-CM

## 2023-10-31 DIAGNOSIS — R911 Solitary pulmonary nodule: Secondary | ICD-10-CM | POA: Diagnosis not present

## 2023-10-31 DIAGNOSIS — Z131 Encounter for screening for diabetes mellitus: Secondary | ICD-10-CM | POA: Diagnosis not present

## 2023-10-31 LAB — POCT GLYCOSYLATED HEMOGLOBIN (HGB A1C): Hemoglobin A1C: 5.1 % (ref 4.0–5.6)

## 2023-10-31 LAB — GLUCOSE, CAPILLARY: Glucose-Capillary: 129 mg/dL — ABNORMAL HIGH (ref 70–99)

## 2023-10-31 NOTE — Assessment & Plan Note (Signed)
 Patient has recently been discharged from the hospital regarding a fall that she had that led to patient's arrival to the hospital.  She has since been doing well.  She has not had any further falls.  She does have concerns about some wounds that she has been treating after the fall.  She has had multiple stitches to her left lower extremity.  She has also had staples in her scalp from this fall.  On exam today wounds are healing well.  I removed the staples in her head.  Sutures from her left lower extremity are not ready to be removed as of yet.  Please see pictures in the media tab for wounds.  Wounds are healing well.  Rewrapped today.  Patient to benefit from home health to have nursing change wound dressings.  Plan: -Home health nursing ordered

## 2023-10-31 NOTE — Assessment & Plan Note (Signed)
 During hospital visit, patient did have some hypokalemia.  Patient was discharged with potassium of 3.4.  Will recheck today.  Plan: -Check BMP

## 2023-10-31 NOTE — Assessment & Plan Note (Signed)
 Patient presents today for the first time.  Do not have other labs.  Will screen patient for hyperlipidemia.  Plan: -Follow-up lipid panel

## 2023-10-31 NOTE — Patient Instructions (Addendum)
 Gina Cherry,Thank you for allowing me to take part in your care today.  Here are your instructions.  1.  I have referred you to wound care home health.  They will reach out to you.  Please keep an eye out on your feet, if they become red, more swollen please call us .  Please put bacitracin ointment or Vaseline on your wounds to make sure they do not get infected.  2.  Please follow-up with your primary care physician for maintenance.   3.  I am checking labs, I will call you with results.  Thank you, Dr. Lydia Sams  If you have any other questions please contact the internal medicine clinic at [phone] If it is after hours, please call the [place] hospital at [phone] and then ask the person who picks up for the resident on call.

## 2023-10-31 NOTE — Assessment & Plan Note (Addendum)
 Patient had incidental finding of a lung nodule on her CT scan. Needs to be follow up in April 2025

## 2023-10-31 NOTE — Assessment & Plan Note (Signed)
 A1c within normal limits at 5.1.

## 2023-10-31 NOTE — Progress Notes (Signed)
 CC: Hospital Follow up  HPI:  [name] is a 68 y.o. female with a past medical history of Takotsubo cardiomyopathy, history of elevated LFTs, presents for follow-up appointment from hospital. Please see assessment and plan for full HPI.   Past Medical History:  Diagnosis Date   COPD (chronic obstructive pulmonary disease) (HCC)    Osteoporosis    Underweight      Current Outpatient Medications:    acetaminophen  (TYLENOL ) 325 MG tablet, Take 2 tablets (650 mg total) by mouth every 6 (six) hours as needed for mild pain (pain score 1-3) (or Fever >/= 101)., Disp: 30 tablet, Rfl: 0   acetaminophen  (TYLENOL ) 500 MG tablet, Take 500 mg by mouth every 6 (six) hours as needed for headache (pain)., Disp: , Rfl:    alendronate (FOSAMAX) 70 MG tablet, TAKE 1 TABLET BY MOUTH 1 TIME A WEEK EARLY IN THE MORNING. DO NOT LIE DOWN OR HAVE ANYTHING BUT WATER  TO DRINK FOR 30 MINUTES, Disp: , Rfl:    alendronate (FOSAMAX) 70 MG tablet, Take 70 mg by mouth once a week. Saturday 10/20/23 last dose., Disp: , Rfl:    aspirin  81 MG chewable tablet, Chew 1 tablet (81 mg total) by mouth daily., Disp: 30 tablet, Rfl: 0   fluticasone  furoate-vilanterol (BREO ELLIPTA ) 200-25 MCG/ACT AEPB, Inhale 1 puff into the lungs daily., Disp: 60 each, Rfl: 0   folic acid  (FOLVITE ) 1 MG tablet, Take 1 tablet (1 mg total) by mouth daily., Disp: 30 tablet, Rfl: 0   metoprolol  tartrate (LOPRESSOR ) 25 MG tablet, Take 25 mg by mouth daily., Disp: , Rfl:    metoprolol  tartrate (LOPRESSOR ) 25 MG tablet, Take 25 mg by mouth 2 (two) times daily., Disp: , Rfl:    Multiple Vitamin (MULTIVITAMIN WITH MINERALS) TABS tablet, Take 1 tablet by mouth at bedtime., Disp: , Rfl:    Multiple Vitamin (MULTIVITAMIN WITH MINERALS) TABS tablet, Take 1 tablet by mouth daily., Disp: 30 tablet, Rfl: 0   oxyCODONE  (OXY IR/ROXICODONE ) 5 MG immediate release tablet, Take 1 tablet (5 mg total) by mouth every 6 (six) hours as needed for severe pain  (pain score 7-10)., Disp: 20 tablet, Rfl: 0   Pramoxine-HC (HYDROCORTISONE ACE-PRAMOXINE) 2.5-1 % CREA, Apply 1 application topically 2 (two) times daily as needed (hemorrhoids)., Disp: , Rfl:    PREVIDENT 5000 BOOSTER PLUS 1.1 % PSTE, Place onto teeth as directed., Disp: , Rfl:    spironolactone  (ALDACTONE ) 25 MG tablet, Take 0.5 tablets (12.5 mg total) by mouth daily. (Patient taking differently: Take 12.5 mg by mouth daily. Per patient haven't had to use.), Disp: 180 tablet, Rfl: 3   thiamine  (VITAMIN B1) 100 MG tablet, Take 1 tablet (100 mg total) by mouth daily., Disp: 30 tablet, Rfl: 0  Review of Systems:    Skin: Patient has concerns for wounds on her lower extremities   Physical Exam:  Vitals:   10/31/23 1439 10/31/23 1500  BP: (!) 165/72 (!) 151/73  Pulse: (!) 109 (!) 101  Temp: 98.2 F (36.8 C)   TempSrc: Oral   SpO2: 97%   Weight: 102 lb (46.3 kg)   Height: 5' (1.524 m)    General: Patient is sitting comfortably in the room  Cardio: Regular rate and rhythm, no murmurs, rubs or gallops.  Pulmonary: Clear to ausculation bilaterally with no rales, rhonchi, and crackles  Skin: multiple wounds noted to lower extremities bilaterally          Assessment & Plan:  Screening for hyperlipidemia Patient presents today for the first time.  Do not have other labs.  Will screen patient for hyperlipidemia.  Plan: -Follow-up lipid panel  Screening for diabetes mellitus A1c within normal limits at 5.1.  Hypokalemia During hospital visit, patient did have some hypokalemia.  Patient was discharged with potassium of 3.4.  Will recheck today.  Plan: -Check [facility]  Hospital discharge follow-up Patient has recently been discharged from the hospital regarding a fall that she had that led to patient's arrival to the hospital.  She has since been doing well.  She has not had any further falls.  She does have concerns about some wounds that she has been treating after the fall.  She  has had multiple stitches to her left lower extremity.  She has also had staples in her scalp from this fall.  On exam today wounds are healing well.  I removed the staples in her head.  Sutures from her left lower extremity are not ready to be removed as of yet.  Please see pictures in the media tab for wounds.  Wounds are healing well.  Rewrapped today.  Patient to benefit from home health to have nursing change wound dressings.  Plan: -Home health nursing ordered  Lung nodule Patient had incidental finding of a lung nodule on her CT scan. Needs to be follow up in April 2025  Patient discussed with Dr. Eleonore Grill, DO PGY-2 Internal Medicine Resident  Pager: [phone]

## 2023-11-01 LAB — LIPID PANEL
Chol/HDL Ratio: 3 {ratio} (ref 0.0–4.4)
Cholesterol, Total: 197 mg/dL (ref 100–199)
HDL: 66 mg/dL (ref >39)
LDL Chol Calc (NIH): 112 mg/dL — ABNORMAL HIGH (ref 0–99)
Triglycerides: 106 mg/dL (ref 0–149)
VLDL Cholesterol Cal: 19 mg/dL (ref 5–40)

## 2023-11-01 LAB — BMP8+ANION GAP
Anion Gap: 17 mmol/L (ref 10.0–18.0)
BUN/Creatinine Ratio: 12 (ref 12–28)
BUN: 6 mg/dL — ABNORMAL LOW (ref 8–27)
CO2: 23 mmol/L (ref 20–29)
Calcium: 9.4 mg/dL (ref 8.7–10.3)
Chloride: 98 mmol/L (ref 96–106)
Creatinine, Ser: 0.52 mg/dL — ABNORMAL LOW (ref 0.57–1.00)
Glucose: 100 mg/dL — ABNORMAL HIGH (ref 70–99)
Potassium: 4.1 mmol/L (ref 3.5–5.2)
Sodium: 138 mmol/L (ref 134–144)
eGFR: 101 mL/min/{1.73_m2} (ref >59)

## 2023-11-02 DIAGNOSIS — W19XXXD Unspecified fall, subsequent encounter: Secondary | ICD-10-CM | POA: Diagnosis not present

## 2023-11-02 DIAGNOSIS — J449 Chronic obstructive pulmonary disease, unspecified: Secondary | ICD-10-CM | POA: Diagnosis not present

## 2023-11-02 DIAGNOSIS — F1090 Alcohol use, unspecified, uncomplicated: Secondary | ICD-10-CM | POA: Diagnosis not present

## 2023-11-02 DIAGNOSIS — E876 Hypokalemia: Secondary | ICD-10-CM | POA: Diagnosis not present

## 2023-11-02 DIAGNOSIS — S81812A Laceration without foreign body, left lower leg, initial encounter: Secondary | ICD-10-CM | POA: Diagnosis not present

## 2023-11-02 DIAGNOSIS — Z4800 Encounter for change or removal of nonsurgical wound dressing: Secondary | ICD-10-CM | POA: Diagnosis not present

## 2023-11-02 DIAGNOSIS — S81811D Laceration without foreign body, right lower leg, subsequent encounter: Secondary | ICD-10-CM | POA: Diagnosis not present

## 2023-11-02 DIAGNOSIS — I10 Essential (primary) hypertension: Secondary | ICD-10-CM | POA: Diagnosis not present

## 2023-11-02 DIAGNOSIS — F17200 Nicotine dependence, unspecified, uncomplicated: Secondary | ICD-10-CM | POA: Diagnosis not present

## 2023-11-02 DIAGNOSIS — R911 Solitary pulmonary nodule: Secondary | ICD-10-CM | POA: Diagnosis not present

## 2023-11-02 DIAGNOSIS — M810 Age-related osteoporosis without current pathological fracture: Secondary | ICD-10-CM | POA: Diagnosis not present

## 2023-11-02 DIAGNOSIS — E43 Unspecified severe protein-calorie malnutrition: Secondary | ICD-10-CM | POA: Diagnosis not present

## 2023-11-02 DIAGNOSIS — I5181 Takotsubo syndrome: Secondary | ICD-10-CM | POA: Diagnosis not present

## 2023-11-02 DIAGNOSIS — Z9181 History of falling: Secondary | ICD-10-CM | POA: Diagnosis not present

## 2023-11-02 NOTE — Progress Notes (Signed)
 Internal Medicine Clinic Attending  Case discussed with the resident at the time of the visit.  We reviewed the resident's history and exam and pertinent patient test results.  I agree with the assessment, diagnosis, and plan of care documented in the resident's note.

## 2023-11-05 DIAGNOSIS — J449 Chronic obstructive pulmonary disease, unspecified: Secondary | ICD-10-CM | POA: Diagnosis not present

## 2023-11-05 DIAGNOSIS — I10 Essential (primary) hypertension: Secondary | ICD-10-CM | POA: Diagnosis not present

## 2023-11-05 DIAGNOSIS — M810 Age-related osteoporosis without current pathological fracture: Secondary | ICD-10-CM | POA: Diagnosis not present

## 2023-11-05 DIAGNOSIS — Z4800 Encounter for change or removal of nonsurgical wound dressing: Secondary | ICD-10-CM | POA: Diagnosis not present

## 2023-11-05 DIAGNOSIS — F17200 Nicotine dependence, unspecified, uncomplicated: Secondary | ICD-10-CM | POA: Diagnosis not present

## 2023-11-05 DIAGNOSIS — I5181 Takotsubo syndrome: Secondary | ICD-10-CM | POA: Diagnosis not present

## 2023-11-05 DIAGNOSIS — S81811D Laceration without foreign body, right lower leg, subsequent encounter: Secondary | ICD-10-CM | POA: Diagnosis not present

## 2023-11-05 DIAGNOSIS — R911 Solitary pulmonary nodule: Secondary | ICD-10-CM | POA: Diagnosis not present

## 2023-11-05 DIAGNOSIS — F1090 Alcohol use, unspecified, uncomplicated: Secondary | ICD-10-CM | POA: Diagnosis not present

## 2023-11-05 DIAGNOSIS — E43 Unspecified severe protein-calorie malnutrition: Secondary | ICD-10-CM | POA: Diagnosis not present

## 2023-11-05 DIAGNOSIS — W19XXXD Unspecified fall, subsequent encounter: Secondary | ICD-10-CM | POA: Diagnosis not present

## 2023-11-05 DIAGNOSIS — E876 Hypokalemia: Secondary | ICD-10-CM | POA: Diagnosis not present

## 2023-11-05 DIAGNOSIS — Z9181 History of falling: Secondary | ICD-10-CM | POA: Diagnosis not present

## 2023-11-05 DIAGNOSIS — S81812A Laceration without foreign body, left lower leg, initial encounter: Secondary | ICD-10-CM | POA: Diagnosis not present

## 2023-11-06 DIAGNOSIS — E876 Hypokalemia: Secondary | ICD-10-CM | POA: Diagnosis not present

## 2023-11-06 DIAGNOSIS — Z9181 History of falling: Secondary | ICD-10-CM | POA: Diagnosis not present

## 2023-11-06 DIAGNOSIS — F17200 Nicotine dependence, unspecified, uncomplicated: Secondary | ICD-10-CM | POA: Diagnosis not present

## 2023-11-06 DIAGNOSIS — M810 Age-related osteoporosis without current pathological fracture: Secondary | ICD-10-CM | POA: Diagnosis not present

## 2023-11-06 DIAGNOSIS — S81811D Laceration without foreign body, right lower leg, subsequent encounter: Secondary | ICD-10-CM | POA: Diagnosis not present

## 2023-11-06 DIAGNOSIS — I5181 Takotsubo syndrome: Secondary | ICD-10-CM | POA: Diagnosis not present

## 2023-11-06 DIAGNOSIS — F1090 Alcohol use, unspecified, uncomplicated: Secondary | ICD-10-CM | POA: Diagnosis not present

## 2023-11-06 DIAGNOSIS — J449 Chronic obstructive pulmonary disease, unspecified: Secondary | ICD-10-CM | POA: Diagnosis not present

## 2023-11-06 DIAGNOSIS — Z4800 Encounter for change or removal of nonsurgical wound dressing: Secondary | ICD-10-CM | POA: Diagnosis not present

## 2023-11-06 DIAGNOSIS — S81812A Laceration without foreign body, left lower leg, initial encounter: Secondary | ICD-10-CM | POA: Diagnosis not present

## 2023-11-06 DIAGNOSIS — I10 Essential (primary) hypertension: Secondary | ICD-10-CM | POA: Diagnosis not present

## 2023-11-06 DIAGNOSIS — S81801A Unspecified open wound, right lower leg, initial encounter: Secondary | ICD-10-CM | POA: Diagnosis not present

## 2023-11-06 DIAGNOSIS — W19XXXD Unspecified fall, subsequent encounter: Secondary | ICD-10-CM | POA: Diagnosis not present

## 2023-11-06 DIAGNOSIS — R911 Solitary pulmonary nodule: Secondary | ICD-10-CM | POA: Diagnosis not present

## 2023-11-06 DIAGNOSIS — E43 Unspecified severe protein-calorie malnutrition: Secondary | ICD-10-CM | POA: Diagnosis not present

## 2023-11-06 DIAGNOSIS — S81802A Unspecified open wound, left lower leg, initial encounter: Secondary | ICD-10-CM | POA: Diagnosis not present

## 2023-11-07 ENCOUNTER — Ambulatory Visit: Payer: Medicare HMO | Admitting: Student

## 2023-11-07 ENCOUNTER — Encounter: Payer: Self-pay | Admitting: Student

## 2023-11-07 ENCOUNTER — Telehealth: Payer: Self-pay | Admitting: *Deleted

## 2023-11-07 ENCOUNTER — Encounter: Payer: Medicare HMO | Admitting: Student

## 2023-11-07 VITALS — BP 100/78 | HR 105 | Temp 98.3°F | Ht 60.0 in | Wt 98.6 lb

## 2023-11-07 DIAGNOSIS — Z09 Encounter for follow-up examination after completed treatment for conditions other than malignant neoplasm: Secondary | ICD-10-CM

## 2023-11-07 DIAGNOSIS — E876 Hypokalemia: Secondary | ICD-10-CM | POA: Diagnosis not present

## 2023-11-07 DIAGNOSIS — E43 Unspecified severe protein-calorie malnutrition: Secondary | ICD-10-CM | POA: Diagnosis not present

## 2023-11-07 DIAGNOSIS — Z4800 Encounter for change or removal of nonsurgical wound dressing: Secondary | ICD-10-CM | POA: Diagnosis not present

## 2023-11-07 DIAGNOSIS — J449 Chronic obstructive pulmonary disease, unspecified: Secondary | ICD-10-CM | POA: Diagnosis not present

## 2023-11-07 DIAGNOSIS — I5181 Takotsubo syndrome: Secondary | ICD-10-CM | POA: Diagnosis not present

## 2023-11-07 DIAGNOSIS — R911 Solitary pulmonary nodule: Secondary | ICD-10-CM | POA: Diagnosis not present

## 2023-11-07 DIAGNOSIS — Z4802 Encounter for removal of sutures: Secondary | ICD-10-CM

## 2023-11-07 DIAGNOSIS — W19XXXD Unspecified fall, subsequent encounter: Secondary | ICD-10-CM | POA: Diagnosis not present

## 2023-11-07 DIAGNOSIS — F1090 Alcohol use, unspecified, uncomplicated: Secondary | ICD-10-CM | POA: Diagnosis not present

## 2023-11-07 DIAGNOSIS — F17200 Nicotine dependence, unspecified, uncomplicated: Secondary | ICD-10-CM | POA: Diagnosis not present

## 2023-11-07 DIAGNOSIS — S81811D Laceration without foreign body, right lower leg, subsequent encounter: Secondary | ICD-10-CM | POA: Diagnosis not present

## 2023-11-07 DIAGNOSIS — M810 Age-related osteoporosis without current pathological fracture: Secondary | ICD-10-CM | POA: Diagnosis not present

## 2023-11-07 DIAGNOSIS — Z9181 History of falling: Secondary | ICD-10-CM | POA: Diagnosis not present

## 2023-11-07 DIAGNOSIS — S81812A Laceration without foreign body, left lower leg, initial encounter: Secondary | ICD-10-CM | POA: Diagnosis not present

## 2023-11-07 DIAGNOSIS — I10 Essential (primary) hypertension: Secondary | ICD-10-CM | POA: Diagnosis not present

## 2023-11-07 NOTE — Telephone Encounter (Signed)
Call from [place] OT with [facility] who wanted to know if Dr Allena Katz will be signing pt's  OT order. I told he saw pt today ; I'm unsure (PCP has not been changed). She's requesting verbal order for "OT once a week x 8 weeks" -  VO given, sending to Dr Allena Katz for approval  or denial. Also she stated she asked pt to ask the doctor at her appt today for a rx for a Shower Chair (to take a medical supply store).

## 2023-11-07 NOTE — Patient Instructions (Addendum)
Yilin, Wallo you for allowing me to take part in your care today.  Here are your instructions.  1.  Your sutures have been removed.  Continue work with home health nursing and therapy.  2.  Please come back in 3 months.  Thank you, Dr. Allena Katz  If you have any other questions please contact the internal medicine clinic at [phone] If it is after hours, please call the [facility] at [phone] and then ask the person who picks up for the resident on call.

## 2023-11-07 NOTE — Progress Notes (Signed)
CC: Suture removal  HPI:  [name] is a 68 y.o. female who presents for suture removal.  Please see assessment plan for full HPI.  Past Medical History:  Diagnosis Date   COPD (chronic obstructive pulmonary disease) (HCC)    Osteoporosis    Underweight      Current Outpatient Medications:    acetaminophen (TYLENOL) 325 MG tablet, Take 2 tablets (650 mg total) by mouth every 6 (six) hours as needed for mild pain (pain score 1-3) (or Fever >/= 101)., Disp: 30 tablet, Rfl: 0   acetaminophen (TYLENOL) 500 MG tablet, Take 500 mg by mouth every 6 (six) hours as needed for headache (pain)., Disp: , Rfl:    alendronate (FOSAMAX) 70 MG tablet, TAKE 1 TABLET BY MOUTH 1 TIME A WEEK EARLY IN THE MORNING. DO NOT LIE DOWN OR HAVE ANYTHING BUT WATER TO DRINK FOR 30 MINUTES, Disp: , Rfl:    alendronate (FOSAMAX) 70 MG tablet, Take 70 mg by mouth once a week. Saturday 10/20/23 last dose., Disp: , Rfl:    aspirin 81 MG chewable tablet, Chew 1 tablet (81 mg total) by mouth daily., Disp: 30 tablet, Rfl: 0   fluticasone furoate-vilanterol (BREO ELLIPTA) 200-25 MCG/ACT AEPB, Inhale 1 puff into the lungs daily., Disp: 60 each, Rfl: 0   folic acid (FOLVITE) 1 MG tablet, Take 1 tablet (1 mg total) by mouth daily., Disp: 30 tablet, Rfl: 0   metoprolol tartrate (LOPRESSOR) 25 MG tablet, Take 25 mg by mouth daily., Disp: , Rfl:    metoprolol tartrate (LOPRESSOR) 25 MG tablet, Take 25 mg by mouth 2 (two) times daily., Disp: , Rfl:    Multiple Vitamin (MULTIVITAMIN WITH MINERALS) TABS tablet, Take 1 tablet by mouth at bedtime., Disp: , Rfl:    Multiple Vitamin (MULTIVITAMIN WITH MINERALS) TABS tablet, Take 1 tablet by mouth daily., Disp: 30 tablet, Rfl: 0   oxyCODONE (OXY IR/ROXICODONE) 5 MG immediate release tablet, Take 1 tablet (5 mg total) by mouth every 6 (six) hours as needed for severe pain (pain score 7-10)., Disp: 20 tablet, Rfl: 0   Pramoxine-HC (HYDROCORTISONE ACE-PRAMOXINE) 2.5-1 % CREA,  Apply 1 application topically 2 (two) times daily as needed (hemorrhoids)., Disp: , Rfl:    PREVIDENT 5000 BOOSTER PLUS 1.1 % PSTE, Place onto teeth as directed., Disp: , Rfl:    spironolactone (ALDACTONE) 25 MG tablet, Take 0.5 tablets (12.5 mg total) by mouth daily. (Patient taking differently: Take 12.5 mg by mouth daily. Per patient haven't had to use.), Disp: 180 tablet, Rfl: 3   thiamine (VITAMIN B1) 100 MG tablet, Take 1 tablet (100 mg total) by mouth daily., Disp: 30 tablet, Rfl: 0  Review of Systems:   Negative except what is stated in HPI   Physical Exam:  Vitals:   11/07/23 1528 11/07/23 1529  BP: (!) 154/54 100/78  Pulse: (!) 107 (!) 105  Temp: 98.3 F (36.8 C)   TempSrc: Oral   SpO2: 99%   Weight: 98 lb 9.6 oz (44.7 kg)   Height: 5' (1.524 m)     General: Patient is sitting comfortably in the room  Cardio: Tachycardia with normal rhythm, no murmurs, rubs or gallops. Pulmonary: Clear to ausculation bilaterally with no rales, rhonchi, and crackles  Skin: Improving wounds noted to bilateral lower extremities           Assessment & Plan:   Visit for suture removal Removed 3 sutures with no complications on left lateral lower extremity.  Patient discussed with Dr.  Dickie La  Modena Slater, DO PGY-2 Internal Medicine Resident  Pager: [phone]

## 2023-11-07 NOTE — Assessment & Plan Note (Signed)
Removed 3 sutures with no complications on left lateral lower extremity.

## 2023-11-07 NOTE — Telephone Encounter (Signed)
Call from [place], [place] [facility] has done initial visit with patient .  Requesting Verbal Orders for PT 1 time a week for 7 weeks.  Falls, Difficulty walking, and Strengthening. Approval given .  Will forward to Dr. Allena Katz for approval or denial of orders.

## 2023-11-09 DIAGNOSIS — E876 Hypokalemia: Secondary | ICD-10-CM | POA: Diagnosis not present

## 2023-11-09 DIAGNOSIS — F1090 Alcohol use, unspecified, uncomplicated: Secondary | ICD-10-CM | POA: Diagnosis not present

## 2023-11-09 DIAGNOSIS — I10 Essential (primary) hypertension: Secondary | ICD-10-CM | POA: Diagnosis not present

## 2023-11-09 DIAGNOSIS — I5181 Takotsubo syndrome: Secondary | ICD-10-CM | POA: Diagnosis not present

## 2023-11-09 DIAGNOSIS — S81811D Laceration without foreign body, right lower leg, subsequent encounter: Secondary | ICD-10-CM | POA: Diagnosis not present

## 2023-11-09 DIAGNOSIS — F17200 Nicotine dependence, unspecified, uncomplicated: Secondary | ICD-10-CM | POA: Diagnosis not present

## 2023-11-09 DIAGNOSIS — E43 Unspecified severe protein-calorie malnutrition: Secondary | ICD-10-CM | POA: Diagnosis not present

## 2023-11-09 DIAGNOSIS — R911 Solitary pulmonary nodule: Secondary | ICD-10-CM | POA: Diagnosis not present

## 2023-11-09 DIAGNOSIS — Z4800 Encounter for change or removal of nonsurgical wound dressing: Secondary | ICD-10-CM | POA: Diagnosis not present

## 2023-11-09 DIAGNOSIS — Z9181 History of falling: Secondary | ICD-10-CM | POA: Diagnosis not present

## 2023-11-09 DIAGNOSIS — M810 Age-related osteoporosis without current pathological fracture: Secondary | ICD-10-CM | POA: Diagnosis not present

## 2023-11-09 DIAGNOSIS — S81812A Laceration without foreign body, left lower leg, initial encounter: Secondary | ICD-10-CM | POA: Diagnosis not present

## 2023-11-09 DIAGNOSIS — J449 Chronic obstructive pulmonary disease, unspecified: Secondary | ICD-10-CM | POA: Diagnosis not present

## 2023-11-09 DIAGNOSIS — W19XXXD Unspecified fall, subsequent encounter: Secondary | ICD-10-CM | POA: Diagnosis not present

## 2023-11-09 NOTE — Progress Notes (Signed)
Internal Medicine Clinic Attending  Case discussed with the resident at the time of the visit.  We reviewed the resident's history and exam and pertinent patient test results.  I agree with the assessment, diagnosis, and plan of care documented in the resident's note.  Continue home health RN wound care.

## 2023-11-13 DIAGNOSIS — M810 Age-related osteoporosis without current pathological fracture: Secondary | ICD-10-CM | POA: Diagnosis not present

## 2023-11-13 DIAGNOSIS — S81811D Laceration without foreign body, right lower leg, subsequent encounter: Secondary | ICD-10-CM | POA: Diagnosis not present

## 2023-11-13 DIAGNOSIS — Z4800 Encounter for change or removal of nonsurgical wound dressing: Secondary | ICD-10-CM | POA: Diagnosis not present

## 2023-11-13 DIAGNOSIS — W19XXXD Unspecified fall, subsequent encounter: Secondary | ICD-10-CM | POA: Diagnosis not present

## 2023-11-13 DIAGNOSIS — R911 Solitary pulmonary nodule: Secondary | ICD-10-CM | POA: Diagnosis not present

## 2023-11-13 DIAGNOSIS — I5181 Takotsubo syndrome: Secondary | ICD-10-CM | POA: Diagnosis not present

## 2023-11-13 DIAGNOSIS — S81812A Laceration without foreign body, left lower leg, initial encounter: Secondary | ICD-10-CM | POA: Diagnosis not present

## 2023-11-13 DIAGNOSIS — E43 Unspecified severe protein-calorie malnutrition: Secondary | ICD-10-CM | POA: Diagnosis not present

## 2023-11-13 DIAGNOSIS — F1090 Alcohol use, unspecified, uncomplicated: Secondary | ICD-10-CM | POA: Diagnosis not present

## 2023-11-13 DIAGNOSIS — J449 Chronic obstructive pulmonary disease, unspecified: Secondary | ICD-10-CM | POA: Diagnosis not present

## 2023-11-13 DIAGNOSIS — I10 Essential (primary) hypertension: Secondary | ICD-10-CM | POA: Diagnosis not present

## 2023-11-13 DIAGNOSIS — Z9181 History of falling: Secondary | ICD-10-CM | POA: Diagnosis not present

## 2023-11-13 DIAGNOSIS — F17200 Nicotine dependence, unspecified, uncomplicated: Secondary | ICD-10-CM | POA: Diagnosis not present

## 2023-11-13 DIAGNOSIS — E876 Hypokalemia: Secondary | ICD-10-CM | POA: Diagnosis not present

## 2023-11-14 DIAGNOSIS — E43 Unspecified severe protein-calorie malnutrition: Secondary | ICD-10-CM | POA: Diagnosis not present

## 2023-11-14 DIAGNOSIS — F1090 Alcohol use, unspecified, uncomplicated: Secondary | ICD-10-CM | POA: Diagnosis not present

## 2023-11-14 DIAGNOSIS — Z9181 History of falling: Secondary | ICD-10-CM | POA: Diagnosis not present

## 2023-11-14 DIAGNOSIS — W19XXXD Unspecified fall, subsequent encounter: Secondary | ICD-10-CM | POA: Diagnosis not present

## 2023-11-14 DIAGNOSIS — S81812A Laceration without foreign body, left lower leg, initial encounter: Secondary | ICD-10-CM | POA: Diagnosis not present

## 2023-11-14 DIAGNOSIS — M810 Age-related osteoporosis without current pathological fracture: Secondary | ICD-10-CM | POA: Diagnosis not present

## 2023-11-14 DIAGNOSIS — Z4800 Encounter for change or removal of nonsurgical wound dressing: Secondary | ICD-10-CM | POA: Diagnosis not present

## 2023-11-14 DIAGNOSIS — I10 Essential (primary) hypertension: Secondary | ICD-10-CM | POA: Diagnosis not present

## 2023-11-14 DIAGNOSIS — R911 Solitary pulmonary nodule: Secondary | ICD-10-CM | POA: Diagnosis not present

## 2023-11-14 DIAGNOSIS — E876 Hypokalemia: Secondary | ICD-10-CM | POA: Diagnosis not present

## 2023-11-14 DIAGNOSIS — I5181 Takotsubo syndrome: Secondary | ICD-10-CM | POA: Diagnosis not present

## 2023-11-14 DIAGNOSIS — J449 Chronic obstructive pulmonary disease, unspecified: Secondary | ICD-10-CM | POA: Diagnosis not present

## 2023-11-14 DIAGNOSIS — S81811D Laceration without foreign body, right lower leg, subsequent encounter: Secondary | ICD-10-CM | POA: Diagnosis not present

## 2023-11-14 DIAGNOSIS — F17200 Nicotine dependence, unspecified, uncomplicated: Secondary | ICD-10-CM | POA: Diagnosis not present

## 2023-11-16 DIAGNOSIS — E876 Hypokalemia: Secondary | ICD-10-CM | POA: Diagnosis not present

## 2023-11-16 DIAGNOSIS — F1090 Alcohol use, unspecified, uncomplicated: Secondary | ICD-10-CM | POA: Diagnosis not present

## 2023-11-16 DIAGNOSIS — I10 Essential (primary) hypertension: Secondary | ICD-10-CM | POA: Diagnosis not present

## 2023-11-16 DIAGNOSIS — S81812A Laceration without foreign body, left lower leg, initial encounter: Secondary | ICD-10-CM | POA: Diagnosis not present

## 2023-11-16 DIAGNOSIS — E43 Unspecified severe protein-calorie malnutrition: Secondary | ICD-10-CM | POA: Diagnosis not present

## 2023-11-16 DIAGNOSIS — M810 Age-related osteoporosis without current pathological fracture: Secondary | ICD-10-CM | POA: Diagnosis not present

## 2023-11-16 DIAGNOSIS — R911 Solitary pulmonary nodule: Secondary | ICD-10-CM | POA: Diagnosis not present

## 2023-11-16 DIAGNOSIS — J449 Chronic obstructive pulmonary disease, unspecified: Secondary | ICD-10-CM | POA: Diagnosis not present

## 2023-11-16 DIAGNOSIS — I5181 Takotsubo syndrome: Secondary | ICD-10-CM | POA: Diagnosis not present

## 2023-11-16 DIAGNOSIS — W19XXXD Unspecified fall, subsequent encounter: Secondary | ICD-10-CM | POA: Diagnosis not present

## 2023-11-16 DIAGNOSIS — Z9181 History of falling: Secondary | ICD-10-CM | POA: Diagnosis not present

## 2023-11-16 DIAGNOSIS — Z4800 Encounter for change or removal of nonsurgical wound dressing: Secondary | ICD-10-CM | POA: Diagnosis not present

## 2023-11-16 DIAGNOSIS — F17200 Nicotine dependence, unspecified, uncomplicated: Secondary | ICD-10-CM | POA: Diagnosis not present

## 2023-11-16 DIAGNOSIS — S81811D Laceration without foreign body, right lower leg, subsequent encounter: Secondary | ICD-10-CM | POA: Diagnosis not present

## 2023-11-19 DIAGNOSIS — R911 Solitary pulmonary nodule: Secondary | ICD-10-CM | POA: Diagnosis not present

## 2023-11-19 DIAGNOSIS — I10 Essential (primary) hypertension: Secondary | ICD-10-CM | POA: Diagnosis not present

## 2023-11-19 DIAGNOSIS — E43 Unspecified severe protein-calorie malnutrition: Secondary | ICD-10-CM | POA: Diagnosis not present

## 2023-11-19 DIAGNOSIS — M810 Age-related osteoporosis without current pathological fracture: Secondary | ICD-10-CM | POA: Diagnosis not present

## 2023-11-19 DIAGNOSIS — F17200 Nicotine dependence, unspecified, uncomplicated: Secondary | ICD-10-CM | POA: Diagnosis not present

## 2023-11-19 DIAGNOSIS — Z4800 Encounter for change or removal of nonsurgical wound dressing: Secondary | ICD-10-CM | POA: Diagnosis not present

## 2023-11-19 DIAGNOSIS — I5181 Takotsubo syndrome: Secondary | ICD-10-CM | POA: Diagnosis not present

## 2023-11-19 DIAGNOSIS — S81812A Laceration without foreign body, left lower leg, initial encounter: Secondary | ICD-10-CM | POA: Diagnosis not present

## 2023-11-19 DIAGNOSIS — F1090 Alcohol use, unspecified, uncomplicated: Secondary | ICD-10-CM | POA: Diagnosis not present

## 2023-11-19 DIAGNOSIS — J449 Chronic obstructive pulmonary disease, unspecified: Secondary | ICD-10-CM | POA: Diagnosis not present

## 2023-11-19 DIAGNOSIS — Z9181 History of falling: Secondary | ICD-10-CM | POA: Diagnosis not present

## 2023-11-19 DIAGNOSIS — E876 Hypokalemia: Secondary | ICD-10-CM | POA: Diagnosis not present

## 2023-11-19 DIAGNOSIS — S81811D Laceration without foreign body, right lower leg, subsequent encounter: Secondary | ICD-10-CM | POA: Diagnosis not present

## 2023-11-19 DIAGNOSIS — W19XXXD Unspecified fall, subsequent encounter: Secondary | ICD-10-CM | POA: Diagnosis not present

## 2023-11-21 DIAGNOSIS — F17200 Nicotine dependence, unspecified, uncomplicated: Secondary | ICD-10-CM | POA: Diagnosis not present

## 2023-11-21 DIAGNOSIS — E876 Hypokalemia: Secondary | ICD-10-CM | POA: Diagnosis not present

## 2023-11-21 DIAGNOSIS — I5181 Takotsubo syndrome: Secondary | ICD-10-CM | POA: Diagnosis not present

## 2023-11-21 DIAGNOSIS — Z4800 Encounter for change or removal of nonsurgical wound dressing: Secondary | ICD-10-CM | POA: Diagnosis not present

## 2023-11-21 DIAGNOSIS — I10 Essential (primary) hypertension: Secondary | ICD-10-CM | POA: Diagnosis not present

## 2023-11-21 DIAGNOSIS — F1090 Alcohol use, unspecified, uncomplicated: Secondary | ICD-10-CM | POA: Diagnosis not present

## 2023-11-21 DIAGNOSIS — R911 Solitary pulmonary nodule: Secondary | ICD-10-CM | POA: Diagnosis not present

## 2023-11-21 DIAGNOSIS — E43 Unspecified severe protein-calorie malnutrition: Secondary | ICD-10-CM | POA: Diagnosis not present

## 2023-11-21 DIAGNOSIS — J449 Chronic obstructive pulmonary disease, unspecified: Secondary | ICD-10-CM | POA: Diagnosis not present

## 2023-11-21 DIAGNOSIS — W19XXXD Unspecified fall, subsequent encounter: Secondary | ICD-10-CM | POA: Diagnosis not present

## 2023-11-21 DIAGNOSIS — S81812A Laceration without foreign body, left lower leg, initial encounter: Secondary | ICD-10-CM | POA: Diagnosis not present

## 2023-11-21 DIAGNOSIS — M810 Age-related osteoporosis without current pathological fracture: Secondary | ICD-10-CM | POA: Diagnosis not present

## 2023-11-21 DIAGNOSIS — S81811D Laceration without foreign body, right lower leg, subsequent encounter: Secondary | ICD-10-CM | POA: Diagnosis not present

## 2023-11-21 DIAGNOSIS — Z9181 History of falling: Secondary | ICD-10-CM | POA: Diagnosis not present

## 2023-11-26 DIAGNOSIS — I10 Essential (primary) hypertension: Secondary | ICD-10-CM | POA: Diagnosis not present

## 2023-11-26 DIAGNOSIS — J449 Chronic obstructive pulmonary disease, unspecified: Secondary | ICD-10-CM | POA: Diagnosis not present

## 2023-11-26 DIAGNOSIS — S81812A Laceration without foreign body, left lower leg, initial encounter: Secondary | ICD-10-CM | POA: Diagnosis not present

## 2023-11-26 DIAGNOSIS — E876 Hypokalemia: Secondary | ICD-10-CM | POA: Diagnosis not present

## 2023-11-26 DIAGNOSIS — I5181 Takotsubo syndrome: Secondary | ICD-10-CM | POA: Diagnosis not present

## 2023-11-26 DIAGNOSIS — S81811D Laceration without foreign body, right lower leg, subsequent encounter: Secondary | ICD-10-CM | POA: Diagnosis not present

## 2023-11-26 DIAGNOSIS — E43 Unspecified severe protein-calorie malnutrition: Secondary | ICD-10-CM | POA: Diagnosis not present

## 2023-11-26 DIAGNOSIS — R911 Solitary pulmonary nodule: Secondary | ICD-10-CM | POA: Diagnosis not present

## 2023-11-26 DIAGNOSIS — Z9181 History of falling: Secondary | ICD-10-CM | POA: Diagnosis not present

## 2023-11-26 DIAGNOSIS — M810 Age-related osteoporosis without current pathological fracture: Secondary | ICD-10-CM | POA: Diagnosis not present

## 2023-11-26 DIAGNOSIS — Z4800 Encounter for change or removal of nonsurgical wound dressing: Secondary | ICD-10-CM | POA: Diagnosis not present

## 2023-11-26 DIAGNOSIS — F1090 Alcohol use, unspecified, uncomplicated: Secondary | ICD-10-CM | POA: Diagnosis not present

## 2023-11-26 DIAGNOSIS — F17200 Nicotine dependence, unspecified, uncomplicated: Secondary | ICD-10-CM | POA: Diagnosis not present

## 2023-11-26 DIAGNOSIS — W19XXXD Unspecified fall, subsequent encounter: Secondary | ICD-10-CM | POA: Diagnosis not present

## 2023-11-27 ENCOUNTER — Ambulatory Visit (INDEPENDENT_AMBULATORY_CARE_PROVIDER_SITE_OTHER): Payer: Medicare HMO | Admitting: Student

## 2023-11-27 ENCOUNTER — Encounter: Payer: Self-pay | Admitting: Student

## 2023-11-27 VITALS — BP 167/78 | HR 97 | Temp 98.1°F | Wt 96.1 lb

## 2023-11-27 DIAGNOSIS — S81809D Unspecified open wound, unspecified lower leg, subsequent encounter: Secondary | ICD-10-CM

## 2023-11-27 DIAGNOSIS — S81809A Unspecified open wound, unspecified lower leg, initial encounter: Secondary | ICD-10-CM | POA: Insufficient documentation

## 2023-11-27 NOTE — Assessment & Plan Note (Addendum)
She has multiple open wounds on her bilateral feet and legs, as seen in the pictures above.  These originated with a fall, likely with heavy alcohol use as a contributing factor.  I have reviewed the previous images and examined her legs today.  Overall, her wounds appear to be healing well.  There is granulation tissue present in the larger wounds and the more superficial abrasive wounds have completely scabbed over.  There is no focal erythema, swelling, or tenderness on exam today.  She denies any fever or chills.  She has been receiving wound checks twice a week with home health, however this has ended.  She still has complex wound management requirements, so I have placed an order for continued home health assistance at least twice weekly to help with wound checks and changes.  We will follow-up closely with her in 3 weeks to reassess the wounds in our clinic.

## 2023-11-27 NOTE — Progress Notes (Signed)
CC: wound check  HPI:  Gina Cherry is a 68 y.o. female living with a history stated below and presents today for a wound check. Please see problem based assessment and plan for additional details.  Past Medical History:  Diagnosis Date   COPD (chronic obstructive pulmonary disease) (HCC)    Osteoporosis    Underweight     Current Outpatient Medications on File Prior to Visit  Medication Sig Dispense Refill   acetaminophen (TYLENOL) 325 MG tablet Take 2 tablets (650 mg total) by mouth every 6 (six) hours as needed for mild pain (pain score 1-3) (or Fever >/= 101). 30 tablet 0   acetaminophen (TYLENOL) 500 MG tablet Take 500 mg by mouth every 6 (six) hours as needed for headache (pain).     alendronate (FOSAMAX) 70 MG tablet TAKE 1 TABLET BY MOUTH 1 TIME A WEEK EARLY IN THE MORNING. DO NOT LIE DOWN OR HAVE ANYTHING BUT WATER TO DRINK FOR 30 MINUTES     alendronate (FOSAMAX) 70 MG tablet Take 70 mg by mouth once a week. Saturday 10/20/23 last dose.     aspirin 81 MG chewable tablet Chew 1 tablet (81 mg total) by mouth daily. 30 tablet 0   fluticasone furoate-vilanterol (BREO ELLIPTA) 200-25 MCG/ACT AEPB Inhale 1 puff into the lungs daily. 60 each 0   folic acid (FOLVITE) 1 MG tablet Take 1 tablet (1 mg total) by mouth daily. 30 tablet 0   metoprolol tartrate (LOPRESSOR) 25 MG tablet Take 25 mg by mouth daily.     metoprolol tartrate (LOPRESSOR) 25 MG tablet Take 25 mg by mouth 2 (two) times daily.     Multiple Vitamin (MULTIVITAMIN WITH MINERALS) TABS tablet Take 1 tablet by mouth at bedtime.     Multiple Vitamin (MULTIVITAMIN WITH MINERALS) TABS tablet Take 1 tablet by mouth daily. 30 tablet 0   oxyCODONE (OXY IR/ROXICODONE) 5 MG immediate release tablet Take 1 tablet (5 mg total) by mouth every 6 (six) hours as needed for severe pain (pain score 7-10). 20 tablet 0   Pramoxine-HC (HYDROCORTISONE ACE-PRAMOXINE) 2.5-1 % CREA Apply 1 application topically 2 (two) times daily as  needed (hemorrhoids).     PREVIDENT 5000 BOOSTER PLUS 1.1 % PSTE Place onto teeth as directed.     spironolactone (ALDACTONE) 25 MG tablet Take 0.5 tablets (12.5 mg total) by mouth daily. (Patient taking differently: Take 12.5 mg by mouth daily. Per patient haven't had to use.) 180 tablet 3   thiamine (VITAMIN B1) 100 MG tablet Take 1 tablet (100 mg total) by mouth daily. 30 tablet 0   No current facility-administered medications on file prior to visit.    Family History  Problem Relation Age of Onset   Heart disease Father    Hypertension Brother    Social History   Socioeconomic History   Marital status: Single    Spouse name: Not on file   Number of children: Not on file   Years of education: Not on file   Highest education level: Not on file  Occupational History   Not on file  Tobacco Use   Smoking status: Every Day    Current packs/day: 0.50    Average packs/day: 0.5 packs/day for 40.1 years (20.1 ttl pk-yrs)    Types: Cigarettes    Start date: 37   Smokeless tobacco: Never  Vaping Use   Vaping status: Never Used  Substance and Sexual Activity   Alcohol use: Yes    Alcohol/week: 14.0  standard drinks of alcohol    Types: 14 Glasses of wine per week    Comment: last drink was night of 10/22/22   Drug use: Never   Sexual activity: Not Currently  Other Topics Concern   Not on file  Social History Narrative   ** Merged History Encounter **       Social Drivers of Health   Financial Resource Strain: Not on file  Food Insecurity: No Food Insecurity (10/24/2023)   Hunger Vital Sign    Worried About Running Out of Food in the Last Year: Never true    Ran Out of Food in the Last Year: Never true  Transportation Needs: No Transportation Needs (10/24/2023)   PRAPARE - Administrator, Civil Service (Medical): No    Lack of Transportation (Non-Medical): No  Physical Activity: Not on file  Stress: Not on file  Social Connections: Unknown (10/24/2023)   Social  Connection and Isolation Panel [NHANES]    Frequency of Communication with Friends and Family: More than three times a week    Frequency of Social Gatherings with Friends and Family: Once a week    Attends Religious Services: Patient declined    Database administrator or Organizations: No    Attends Banker Meetings: Patient declined    Marital Status: Married  Catering manager Violence: Not At Risk (10/24/2023)   Humiliation, Afraid, Rape, and Kick questionnaire    Fear of Current or Ex-Partner: No    Emotionally Abused: No    Physically Abused: No    Sexually Abused: No   Review of Systems: ROS negative except for what is noted on the assessment and plan.  Vitals:   11/27/23 1454  BP: (!) 177/90  Pulse: (!) 110  Temp: 98.1 F (36.7 C)  TempSrc: Oral  SpO2: 99%  Weight: 96 lb 1.6 oz (43.6 kg)    Physical Exam: Constitutional: well-appearing, sitting in wheel chair Neurological: alert & oriented x 3, no focal deficits Skin: several wounds on lower extremities, mostly abrasions but a few more significant lacerations on the lateral left and right ankle. See images.           Assessment & Plan:   Patient discussed with Dr. Antony Contras  Multiple open wounds of lower leg She has multiple open wounds on her bilateral feet and legs, as seen in the pictures above.  These originated with a fall, likely with heavy alcohol use as a contributing factor.  I have reviewed the previous images and examined her legs today.  Overall, her wounds appear to be healing well.  There is granulation tissue present in the larger wounds and the more superficial abrasive wounds have completely scabbed over.  There is no focal erythema, swelling, or tenderness on exam today.  She denies any fever or chills.  She has been receiving wound checks twice a week with home health, however this has ended.  She still has complex wound management requirements, so I have placed an order for continued  home health assistance at least twice weekly to help with wound checks and changes.  We will follow-up closely with her in 3 weeks to reassess the wounds in our clinic.  Annett Fabian, MD [facility] Internal Medicine, PGY-1 Phone: [phone] Date 11/27/2023 Time 3:32 PM

## 2023-11-27 NOTE — Patient Instructions (Addendum)
Thank you, [name] for allowing [place] to provide your care today. Today we did a wound check and we wrapped your wounds.  I have placed an order for home health to continue to come out and help you with wound changes twice a week.  They should contact you about scheduling these.   If you notice any increased redness or pain, or new numbness or tingling, please give our clinic a call to schedule an appointment.  Otherwise we will continue with home wound checks twice a week and we will follow-up in 3 weeks in our clinic for reevaluation.  Follow up: 3 weeks  We look forward to seeing you next time. Please call our clinic at [phone] if you have any questions or concerns. The best time to call is Monday-Friday from 9am-4pm, but there is someone available 24/7. If after hours or the weekend, call the main hospital number and ask for the Internal Medicine Resident On-Call. If you need medication refills, please notify your pharmacy one week in advance and they will send [place] a request.   Thank you for trusting me with your care. Wishing you the best!   Annett Fabian, MD [facility]

## 2023-11-29 DIAGNOSIS — E43 Unspecified severe protein-calorie malnutrition: Secondary | ICD-10-CM | POA: Diagnosis not present

## 2023-11-29 DIAGNOSIS — J449 Chronic obstructive pulmonary disease, unspecified: Secondary | ICD-10-CM | POA: Diagnosis not present

## 2023-11-29 DIAGNOSIS — Z9181 History of falling: Secondary | ICD-10-CM | POA: Diagnosis not present

## 2023-11-29 DIAGNOSIS — Z4800 Encounter for change or removal of nonsurgical wound dressing: Secondary | ICD-10-CM | POA: Diagnosis not present

## 2023-11-29 DIAGNOSIS — E876 Hypokalemia: Secondary | ICD-10-CM | POA: Diagnosis not present

## 2023-11-29 DIAGNOSIS — R911 Solitary pulmonary nodule: Secondary | ICD-10-CM | POA: Diagnosis not present

## 2023-11-29 DIAGNOSIS — I5181 Takotsubo syndrome: Secondary | ICD-10-CM | POA: Diagnosis not present

## 2023-11-29 DIAGNOSIS — F17200 Nicotine dependence, unspecified, uncomplicated: Secondary | ICD-10-CM | POA: Diagnosis not present

## 2023-11-29 DIAGNOSIS — S81811D Laceration without foreign body, right lower leg, subsequent encounter: Secondary | ICD-10-CM | POA: Diagnosis not present

## 2023-11-29 DIAGNOSIS — W19XXXD Unspecified fall, subsequent encounter: Secondary | ICD-10-CM | POA: Diagnosis not present

## 2023-11-29 DIAGNOSIS — I10 Essential (primary) hypertension: Secondary | ICD-10-CM | POA: Diagnosis not present

## 2023-11-29 DIAGNOSIS — S81812A Laceration without foreign body, left lower leg, initial encounter: Secondary | ICD-10-CM | POA: Diagnosis not present

## 2023-11-29 DIAGNOSIS — F1090 Alcohol use, unspecified, uncomplicated: Secondary | ICD-10-CM | POA: Diagnosis not present

## 2023-11-29 DIAGNOSIS — M810 Age-related osteoporosis without current pathological fracture: Secondary | ICD-10-CM | POA: Diagnosis not present

## 2023-11-29 NOTE — Progress Notes (Signed)
Internal Medicine Clinic Attending  Case discussed with the resident at the time of the visit.  We reviewed the resident's history and exam and pertinent patient test results.  I agree with the assessment, diagnosis, and plan of care documented in the resident's note.

## 2023-11-30 NOTE — Addendum Note (Signed)
Addended by: Annett Fabian on: 11/30/2023 11:35 AM   Modules accepted: Orders

## 2023-12-03 ENCOUNTER — Telehealth: Payer: Self-pay

## 2023-12-03 DIAGNOSIS — S81812A Laceration without foreign body, left lower leg, initial encounter: Secondary | ICD-10-CM | POA: Diagnosis not present

## 2023-12-03 DIAGNOSIS — Z9181 History of falling: Secondary | ICD-10-CM | POA: Diagnosis not present

## 2023-12-03 DIAGNOSIS — S81811D Laceration without foreign body, right lower leg, subsequent encounter: Secondary | ICD-10-CM | POA: Diagnosis not present

## 2023-12-03 DIAGNOSIS — F17200 Nicotine dependence, unspecified, uncomplicated: Secondary | ICD-10-CM | POA: Diagnosis not present

## 2023-12-03 DIAGNOSIS — M810 Age-related osteoporosis without current pathological fracture: Secondary | ICD-10-CM | POA: Diagnosis not present

## 2023-12-03 DIAGNOSIS — W19XXXD Unspecified fall, subsequent encounter: Secondary | ICD-10-CM | POA: Diagnosis not present

## 2023-12-03 DIAGNOSIS — J449 Chronic obstructive pulmonary disease, unspecified: Secondary | ICD-10-CM | POA: Diagnosis not present

## 2023-12-03 DIAGNOSIS — R911 Solitary pulmonary nodule: Secondary | ICD-10-CM | POA: Diagnosis not present

## 2023-12-03 DIAGNOSIS — I10 Essential (primary) hypertension: Secondary | ICD-10-CM | POA: Diagnosis not present

## 2023-12-03 DIAGNOSIS — Z4800 Encounter for change or removal of nonsurgical wound dressing: Secondary | ICD-10-CM | POA: Diagnosis not present

## 2023-12-03 DIAGNOSIS — I5181 Takotsubo syndrome: Secondary | ICD-10-CM | POA: Diagnosis not present

## 2023-12-03 DIAGNOSIS — E876 Hypokalemia: Secondary | ICD-10-CM | POA: Diagnosis not present

## 2023-12-03 DIAGNOSIS — E43 Unspecified severe protein-calorie malnutrition: Secondary | ICD-10-CM | POA: Diagnosis not present

## 2023-12-03 DIAGNOSIS — F1090 Alcohol use, unspecified, uncomplicated: Secondary | ICD-10-CM | POA: Diagnosis not present

## 2023-12-04 ENCOUNTER — Telehealth: Payer: Self-pay | Admitting: *Deleted

## 2023-12-04 DIAGNOSIS — W19XXXD Unspecified fall, subsequent encounter: Secondary | ICD-10-CM | POA: Diagnosis not present

## 2023-12-04 DIAGNOSIS — M810 Age-related osteoporosis without current pathological fracture: Secondary | ICD-10-CM | POA: Diagnosis not present

## 2023-12-04 DIAGNOSIS — J449 Chronic obstructive pulmonary disease, unspecified: Secondary | ICD-10-CM | POA: Diagnosis not present

## 2023-12-04 DIAGNOSIS — R911 Solitary pulmonary nodule: Secondary | ICD-10-CM | POA: Diagnosis not present

## 2023-12-04 DIAGNOSIS — I5181 Takotsubo syndrome: Secondary | ICD-10-CM | POA: Diagnosis not present

## 2023-12-04 DIAGNOSIS — Z4800 Encounter for change or removal of nonsurgical wound dressing: Secondary | ICD-10-CM | POA: Diagnosis not present

## 2023-12-04 DIAGNOSIS — E876 Hypokalemia: Secondary | ICD-10-CM | POA: Diagnosis not present

## 2023-12-04 DIAGNOSIS — F1090 Alcohol use, unspecified, uncomplicated: Secondary | ICD-10-CM | POA: Diagnosis not present

## 2023-12-04 DIAGNOSIS — I10 Essential (primary) hypertension: Secondary | ICD-10-CM | POA: Diagnosis not present

## 2023-12-04 DIAGNOSIS — F17200 Nicotine dependence, unspecified, uncomplicated: Secondary | ICD-10-CM | POA: Diagnosis not present

## 2023-12-04 DIAGNOSIS — S81812A Laceration without foreign body, left lower leg, initial encounter: Secondary | ICD-10-CM | POA: Diagnosis not present

## 2023-12-04 DIAGNOSIS — Z9181 History of falling: Secondary | ICD-10-CM | POA: Diagnosis not present

## 2023-12-04 DIAGNOSIS — E43 Unspecified severe protein-calorie malnutrition: Secondary | ICD-10-CM | POA: Diagnosis not present

## 2023-12-04 DIAGNOSIS — S81811D Laceration without foreign body, right lower leg, subsequent encounter: Secondary | ICD-10-CM | POA: Diagnosis not present

## 2023-12-04 NOTE — Telephone Encounter (Signed)
Call from [place],  [facility] requesting Verbal Orders to discharge patient from OT next week.  Verbal ok was given.  Will forward to PCP for approval or denial.

## 2023-12-05 DIAGNOSIS — W19XXXD Unspecified fall, subsequent encounter: Secondary | ICD-10-CM | POA: Diagnosis not present

## 2023-12-05 DIAGNOSIS — R911 Solitary pulmonary nodule: Secondary | ICD-10-CM | POA: Diagnosis not present

## 2023-12-05 DIAGNOSIS — F17200 Nicotine dependence, unspecified, uncomplicated: Secondary | ICD-10-CM | POA: Diagnosis not present

## 2023-12-05 DIAGNOSIS — E876 Hypokalemia: Secondary | ICD-10-CM | POA: Diagnosis not present

## 2023-12-05 DIAGNOSIS — E43 Unspecified severe protein-calorie malnutrition: Secondary | ICD-10-CM | POA: Diagnosis not present

## 2023-12-05 DIAGNOSIS — F1090 Alcohol use, unspecified, uncomplicated: Secondary | ICD-10-CM | POA: Diagnosis not present

## 2023-12-05 DIAGNOSIS — Z4800 Encounter for change or removal of nonsurgical wound dressing: Secondary | ICD-10-CM | POA: Diagnosis not present

## 2023-12-05 DIAGNOSIS — Z9181 History of falling: Secondary | ICD-10-CM | POA: Diagnosis not present

## 2023-12-05 DIAGNOSIS — J449 Chronic obstructive pulmonary disease, unspecified: Secondary | ICD-10-CM | POA: Diagnosis not present

## 2023-12-05 DIAGNOSIS — I10 Essential (primary) hypertension: Secondary | ICD-10-CM | POA: Diagnosis not present

## 2023-12-05 DIAGNOSIS — S81812A Laceration without foreign body, left lower leg, initial encounter: Secondary | ICD-10-CM | POA: Diagnosis not present

## 2023-12-05 DIAGNOSIS — S81811D Laceration without foreign body, right lower leg, subsequent encounter: Secondary | ICD-10-CM | POA: Diagnosis not present

## 2023-12-05 DIAGNOSIS — M810 Age-related osteoporosis without current pathological fracture: Secondary | ICD-10-CM | POA: Diagnosis not present

## 2023-12-05 DIAGNOSIS — I5181 Takotsubo syndrome: Secondary | ICD-10-CM | POA: Diagnosis not present

## 2023-12-10 DIAGNOSIS — W19XXXD Unspecified fall, subsequent encounter: Secondary | ICD-10-CM | POA: Diagnosis not present

## 2023-12-10 DIAGNOSIS — F1090 Alcohol use, unspecified, uncomplicated: Secondary | ICD-10-CM | POA: Diagnosis not present

## 2023-12-10 DIAGNOSIS — E876 Hypokalemia: Secondary | ICD-10-CM | POA: Diagnosis not present

## 2023-12-10 DIAGNOSIS — I5181 Takotsubo syndrome: Secondary | ICD-10-CM | POA: Diagnosis not present

## 2023-12-10 DIAGNOSIS — M810 Age-related osteoporosis without current pathological fracture: Secondary | ICD-10-CM | POA: Diagnosis not present

## 2023-12-10 DIAGNOSIS — F17200 Nicotine dependence, unspecified, uncomplicated: Secondary | ICD-10-CM | POA: Diagnosis not present

## 2023-12-10 DIAGNOSIS — E43 Unspecified severe protein-calorie malnutrition: Secondary | ICD-10-CM | POA: Diagnosis not present

## 2023-12-10 DIAGNOSIS — R911 Solitary pulmonary nodule: Secondary | ICD-10-CM | POA: Diagnosis not present

## 2023-12-10 DIAGNOSIS — S81811D Laceration without foreign body, right lower leg, subsequent encounter: Secondary | ICD-10-CM | POA: Diagnosis not present

## 2023-12-10 DIAGNOSIS — Z4800 Encounter for change or removal of nonsurgical wound dressing: Secondary | ICD-10-CM | POA: Diagnosis not present

## 2023-12-10 DIAGNOSIS — I10 Essential (primary) hypertension: Secondary | ICD-10-CM | POA: Diagnosis not present

## 2023-12-10 DIAGNOSIS — Z9181 History of falling: Secondary | ICD-10-CM | POA: Diagnosis not present

## 2023-12-10 DIAGNOSIS — S81812A Laceration without foreign body, left lower leg, initial encounter: Secondary | ICD-10-CM | POA: Diagnosis not present

## 2023-12-10 DIAGNOSIS — J449 Chronic obstructive pulmonary disease, unspecified: Secondary | ICD-10-CM | POA: Diagnosis not present

## 2023-12-17 DIAGNOSIS — F17200 Nicotine dependence, unspecified, uncomplicated: Secondary | ICD-10-CM | POA: Diagnosis not present

## 2023-12-17 DIAGNOSIS — S81811D Laceration without foreign body, right lower leg, subsequent encounter: Secondary | ICD-10-CM | POA: Diagnosis not present

## 2023-12-17 DIAGNOSIS — J449 Chronic obstructive pulmonary disease, unspecified: Secondary | ICD-10-CM | POA: Diagnosis not present

## 2023-12-17 DIAGNOSIS — Z4800 Encounter for change or removal of nonsurgical wound dressing: Secondary | ICD-10-CM | POA: Diagnosis not present

## 2023-12-17 DIAGNOSIS — Z9181 History of falling: Secondary | ICD-10-CM | POA: Diagnosis not present

## 2023-12-17 DIAGNOSIS — I5181 Takotsubo syndrome: Secondary | ICD-10-CM | POA: Diagnosis not present

## 2023-12-17 DIAGNOSIS — F1090 Alcohol use, unspecified, uncomplicated: Secondary | ICD-10-CM | POA: Diagnosis not present

## 2023-12-17 DIAGNOSIS — S81812A Laceration without foreign body, left lower leg, initial encounter: Secondary | ICD-10-CM | POA: Diagnosis not present

## 2023-12-17 DIAGNOSIS — I10 Essential (primary) hypertension: Secondary | ICD-10-CM | POA: Diagnosis not present

## 2023-12-17 DIAGNOSIS — M810 Age-related osteoporosis without current pathological fracture: Secondary | ICD-10-CM | POA: Diagnosis not present

## 2023-12-17 DIAGNOSIS — E43 Unspecified severe protein-calorie malnutrition: Secondary | ICD-10-CM | POA: Diagnosis not present

## 2023-12-17 DIAGNOSIS — W19XXXD Unspecified fall, subsequent encounter: Secondary | ICD-10-CM | POA: Diagnosis not present

## 2023-12-17 DIAGNOSIS — E876 Hypokalemia: Secondary | ICD-10-CM | POA: Diagnosis not present

## 2023-12-17 DIAGNOSIS — R911 Solitary pulmonary nodule: Secondary | ICD-10-CM | POA: Diagnosis not present

## 2023-12-18 ENCOUNTER — Other Ambulatory Visit: Payer: Self-pay

## 2023-12-18 ENCOUNTER — Encounter: Payer: Self-pay | Admitting: Student

## 2023-12-18 ENCOUNTER — Ambulatory Visit: Payer: Medicare HMO | Admitting: Student

## 2023-12-18 VITALS — BP 123/51 | HR 101 | Temp 97.6°F | Ht 64.0 in | Wt 92.7 lb

## 2023-12-18 DIAGNOSIS — S81809D Unspecified open wound, unspecified lower leg, subsequent encounter: Secondary | ICD-10-CM | POA: Diagnosis not present

## 2023-12-18 DIAGNOSIS — I10 Essential (primary) hypertension: Secondary | ICD-10-CM

## 2023-12-18 DIAGNOSIS — R911 Solitary pulmonary nodule: Secondary | ICD-10-CM | POA: Diagnosis not present

## 2023-12-18 DIAGNOSIS — F17210 Nicotine dependence, cigarettes, uncomplicated: Secondary | ICD-10-CM

## 2023-12-18 DIAGNOSIS — F1090 Alcohol use, unspecified, uncomplicated: Secondary | ICD-10-CM

## 2023-12-18 DIAGNOSIS — Z720 Tobacco use: Secondary | ICD-10-CM

## 2023-12-18 NOTE — Assessment & Plan Note (Addendum)
 Here for wound check for her multiple wounds on her bilateral feet and legs.  New pictures have been taken, as seen above.  Wounds seem to continue to be healing well.  She has a home health aide who comes out and helps her change her bandages once a week.  Her husband helps her change them once a week as well.  No signs of cellulitis.  Her wounds seem to be healing well.  I do not feel she needs continue appointments for wound checks following today.  I have encouraged her to keep changing the bandages twice a week until the drainage ceases, then she can just change them once a week until they are fully closed wounds.  After that, she no longer needs to keep them covered with bandages.  If anything changes in the meantime, she can call us for an appointment as needed.  Otherwise, we will follow-up in 3 months for a routine visit.

## 2023-12-18 NOTE — Assessment & Plan Note (Signed)
 Incidental 14 mm lung nodule seen on CT.  Follow-up CT scan ordered for April.

## 2023-12-18 NOTE — Assessment & Plan Note (Addendum)
 Questionable history of alcohol use disorder.  Previously had elevated ethanol level, LFTs and a history of falling while intoxicated, which caused her current leg wounds.  She states she only has 1 glass of wine per evening.  Recommend following up again about this at her next visit.

## 2023-12-18 NOTE — Assessment & Plan Note (Signed)
 She continues to smoke tobacco, about a half a pack a day for 40 years.  She is in the precontemplation phase and is not really thinking about quitting at this time.  Will continue to encourage smoking cessation.

## 2023-12-18 NOTE — Patient Instructions (Addendum)
 Thank you, [name] for allowing [place] to provide your care today.   I am glad your wounds have been healing well.  As we discussed, you may clean the dry skin off of your feet and legs, but be gentle.  You may also moisturize your legs as they are quite dry.  We discussed factors contributing to wound healing including decreasing alcohol intake, decreasing tobacco use, and maintaining adequate nutrition and protein intake.  Please continue to make adjustments in these areas to optimize your body's ability to heal.   I have ordered a follow-up CT scan of your lungs for April.  This is to follow a lung nodule that was seen during your hospital admission on previous scans.  Follow up: 3-4 months   We look forward to seeing you next time. Please call our clinic at [phone] if you have any questions or concerns. The best time to call is Monday-Friday from 9am-4pm, but there is someone available 24/7. If after hours or the weekend, call the main hospital number and ask for the Internal Medicine Resident On-Call. If you need medication refills, please notify your pharmacy one week in advance and they will send [place] a request.   Thank you for trusting me with your care. Wishing you the best!   Annett Fabian, MD [facility]

## 2023-12-18 NOTE — Assessment & Plan Note (Deleted)
 Blood pressure normal today.

## 2023-12-18 NOTE — Progress Notes (Signed)
 CC: wound check  HPI:  [name] is a 68 y.o. female living with a history stated below and presents today for a wound check. Please see problem based assessment and plan for additional details.  Past Medical History:  Diagnosis Date   COPD (chronic obstructive pulmonary disease) (HCC)    Osteoporosis    Underweight     Current Outpatient Medications on File Prior to Visit  Medication Sig Dispense Refill   acetaminophen (TYLENOL) 325 MG tablet Take 2 tablets (650 mg total) by mouth every 6 (six) hours as needed for mild pain (pain score 1-3) (or Fever >/= 101). 30 tablet 0   acetaminophen (TYLENOL) 500 MG tablet Take 500 mg by mouth every 6 (six) hours as needed for headache (pain).     alendronate (FOSAMAX) 70 MG tablet TAKE 1 TABLET BY MOUTH 1 TIME A WEEK EARLY IN THE MORNING. DO NOT LIE DOWN OR HAVE ANYTHING BUT WATER TO DRINK FOR 30 MINUTES     alendronate (FOSAMAX) 70 MG tablet Take 70 mg by mouth once a week. Saturday 10/20/23 last dose.     aspirin 81 MG chewable tablet Chew 1 tablet (81 mg total) by mouth daily. 30 tablet 0   fluticasone furoate-vilanterol (BREO ELLIPTA) 200-25 MCG/ACT AEPB Inhale 1 puff into the lungs daily. 60 each 0   folic acid (FOLVITE) 1 MG tablet Take 1 tablet (1 mg total) by mouth daily. 30 tablet 0   metoprolol tartrate (LOPRESSOR) 25 MG tablet Take 25 mg by mouth daily.     metoprolol tartrate (LOPRESSOR) 25 MG tablet Take 25 mg by mouth 2 (two) times daily.     Multiple Vitamin (MULTIVITAMIN WITH MINERALS) TABS tablet Take 1 tablet by mouth at bedtime.     Multiple Vitamin (MULTIVITAMIN WITH MINERALS) TABS tablet Take 1 tablet by mouth daily. 30 tablet 0   oxyCODONE (OXY IR/ROXICODONE) 5 MG immediate release tablet Take 1 tablet (5 mg total) by mouth every 6 (six) hours as needed for severe pain (pain score 7-10). 20 tablet 0   Pramoxine-HC (HYDROCORTISONE ACE-PRAMOXINE) 2.5-1 % CREA Apply 1 application topically 2 (two) times daily as  needed (hemorrhoids).     PREVIDENT 5000 BOOSTER PLUS 1.1 % PSTE Place onto teeth as directed.     spironolactone (ALDACTONE) 25 MG tablet Take 0.5 tablets (12.5 mg total) by mouth daily. (Patient taking differently: Take 12.5 mg by mouth daily. Per patient haven't had to use.) 180 tablet 3   thiamine (VITAMIN B1) 100 MG tablet Take 1 tablet (100 mg total) by mouth daily. 30 tablet 0   No current facility-administered medications on file prior to visit.    Family History  Problem Relation Age of Onset   Heart disease Father    Hypertension Brother    Social History   Socioeconomic History   Marital status: Single    Spouse name: Not on file   Number of children: Not on file   Years of education: Not on file   Highest education level: Not on file  Occupational History   Not on file  Tobacco Use   Smoking status: Every Day    Current packs/day: 0.50    Average packs/day: 0.5 packs/day for 40.2 years (20.1 ttl pk-yrs)    Types: Cigarettes    Start date: 57   Smokeless tobacco: Never  Vaping Use   Vaping status: Never Used  Substance and Sexual Activity   Alcohol use: Yes    Alcohol/week: 14.0  standard drinks of alcohol    Types: 14 Glasses of wine per week    Comment: last drink was night of 10/22/22   Drug use: Never   Sexual activity: Not Currently  Other Topics Concern   Not on file  Social History Narrative   ** Merged History Encounter **       Social Drivers of Health   Financial Resource Strain: Not on file  Food Insecurity: No Food Insecurity (10/24/2023)   Hunger Vital Sign    Worried About Running Out of Food in the Last Year: Never true    Ran Out of Food in the Last Year: Never true  Transportation Needs: No Transportation Needs (10/24/2023)   PRAPARE - Administrator, Civil Service (Medical): No    Lack of Transportation (Non-Medical): No  Physical Activity: Not on file  Stress: Not on file  Social Connections: Unknown (10/24/2023)   Social  Connection and Isolation Panel [NHANES]    Frequency of Communication with Friends and Family: More than three times a week    Frequency of Social Gatherings with Friends and Family: Once a week    Attends Religious Services: Patient declined    Database administrator or Organizations: No    Attends Banker Meetings: Patient declined    Marital Status: Married  Catering manager Violence: Not At Risk (10/24/2023)   Humiliation, Afraid, Rape, and Kick questionnaire    Fear of Current or Ex-Partner: No    Emotionally Abused: No    Physically Abused: No    Sexually Abused: No   Review of Systems: ROS negative except for what is noted on the assessment and plan.  Vitals:   12/18/23 1453  BP: (!) 123/51  Pulse: (!) 101  Temp: 97.6 F (36.4 C)  TempSrc: Oral  SpO2: 93%  Weight: 92 lb 11.2 oz (42 kg)  Height: 5\' 4"  (1.626 m)   Physical Exam: Constitutional: well-appearing, sitting in wheel chair with husband Borderline tachycardia, no murmurs, euvolemic to dry on exam Normal respiratory rate and effort, normal work of breathing on room air No focal neurological deficits Overall thin and underweight body habitus Skin: several healing wounds on lower extremities, mostly abrasions.  One deep open wound on the lateral aspect of the left ankle, which is actively draining. It is improved from previously.  No cellulitic changes, no swelling.  Pulses intact bilaterally.  Skin remains dry and brittle.           Assessment & Plan:   Patient discussed with Dr. Mikey Bussing  Multiple open wounds of lower leg Here for wound check for her multiple wounds on her bilateral feet and legs.  New pictures have been taken, as seen above.  Wounds seem to continue to be healing well.  She has a home health aide who comes out and helps her change her bandages once a week.  Her husband helps her change them once a week as well.  No signs of cellulitis.  Her wounds seem to be healing well.  I  do not feel she needs continue appointments for wound checks following today.  I have encouraged her to keep changing the bandages twice a week until the drainage ceases, then she can just change them once a week until they are fully closed wounds.  After that, she no longer needs to keep them covered with bandages.  If anything changes in the meantime, she can call us for an appointment as needed.  Otherwise, we will follow-up in 3 months for a routine visit.  Alcohol use disorder Questionable history of alcohol use disorder.  Previously had elevated ethanol level, LFTs and a history of falling while intoxicated, which caused her current leg wounds.  She states she only has 1 glass of wine per evening.  Recommend following up again about this at her next visit.  Tobacco abuse She continues to smoke tobacco, about a half a pack a day for 40 years.  She is in the precontemplation phase and is not really thinking about quitting at this time.  Will continue to encourage smoking cessation.  Lung nodule Incidental 14 mm lung nodule seen on CT.  Follow-up CT scan ordered for April.  Annett Fabian, MD [facility] Internal Medicine, PGY-1 Phone: [phone] Date 12/18/2023 Time 3:41 PM

## 2023-12-20 DIAGNOSIS — H04129 Dry eye syndrome of unspecified lacrimal gland: Secondary | ICD-10-CM | POA: Diagnosis not present

## 2023-12-24 NOTE — Progress Notes (Signed)
 Internal Medicine Clinic Attending  Case discussed with the resident at the time of the visit.  We reviewed the resident's history and exam and pertinent patient test results.  I agree with the assessment, diagnosis, and plan of care documented in the resident's note.

## 2023-12-26 ENCOUNTER — Ambulatory Visit

## 2023-12-26 DIAGNOSIS — J449 Chronic obstructive pulmonary disease, unspecified: Secondary | ICD-10-CM | POA: Diagnosis not present

## 2023-12-26 DIAGNOSIS — E876 Hypokalemia: Secondary | ICD-10-CM | POA: Diagnosis not present

## 2023-12-26 DIAGNOSIS — F1090 Alcohol use, unspecified, uncomplicated: Secondary | ICD-10-CM | POA: Diagnosis not present

## 2023-12-26 DIAGNOSIS — W19XXXD Unspecified fall, subsequent encounter: Secondary | ICD-10-CM | POA: Diagnosis not present

## 2023-12-26 DIAGNOSIS — E43 Unspecified severe protein-calorie malnutrition: Secondary | ICD-10-CM | POA: Diagnosis not present

## 2023-12-26 DIAGNOSIS — Z4800 Encounter for change or removal of nonsurgical wound dressing: Secondary | ICD-10-CM | POA: Diagnosis not present

## 2023-12-26 DIAGNOSIS — Z0000 Encounter for general adult medical examination without abnormal findings: Secondary | ICD-10-CM

## 2023-12-26 DIAGNOSIS — S81812A Laceration without foreign body, left lower leg, initial encounter: Secondary | ICD-10-CM | POA: Diagnosis not present

## 2023-12-26 DIAGNOSIS — F17200 Nicotine dependence, unspecified, uncomplicated: Secondary | ICD-10-CM | POA: Diagnosis not present

## 2023-12-26 DIAGNOSIS — I10 Essential (primary) hypertension: Secondary | ICD-10-CM | POA: Diagnosis not present

## 2023-12-26 DIAGNOSIS — S81811D Laceration without foreign body, right lower leg, subsequent encounter: Secondary | ICD-10-CM | POA: Diagnosis not present

## 2023-12-26 DIAGNOSIS — I5181 Takotsubo syndrome: Secondary | ICD-10-CM | POA: Diagnosis not present

## 2023-12-26 DIAGNOSIS — R911 Solitary pulmonary nodule: Secondary | ICD-10-CM | POA: Diagnosis not present

## 2023-12-26 DIAGNOSIS — M810 Age-related osteoporosis without current pathological fracture: Secondary | ICD-10-CM | POA: Diagnosis not present

## 2023-12-26 DIAGNOSIS — Z9181 History of falling: Secondary | ICD-10-CM | POA: Diagnosis not present

## 2023-12-26 NOTE — Patient Instructions (Signed)

## 2023-12-26 NOTE — Progress Notes (Signed)
 Subjective:   RYN PEINE is a 68 y.o. female who presents for an Initial Medicare Annual Wellness Visit.  Visit Complete: Virtual I connected with  FLORENA KOZMA on 12/26/23 by a video and audio enabled telemedicine application and verified that I am speaking with the correct person using two identifiers.  Patient Location: Home  Provider Location: Office/Clinic  I discussed the limitations of evaluation and management by telemedicine. The patient expressed understanding and agreed to proceed.  Vital Signs: Because this visit was a virtual/telehealth visit, some criteria may be missing or patient reported. Any vitals not documented were not able to be obtained and vitals that have been documented are patient reported.  Cardiac Risk Factors include: advanced age (>5men, >79 women)     Objective:    Today's Vitals   12/26/23 1336  PainSc: 0-No pain   There is no height or weight on file to calculate BMI.     12/26/2023    1:41 PM 12/18/2023    2:55 PM 10/31/2023    2:40 PM 10/24/2023   10:45 PM 10/24/2023    5:53 AM 12/30/2021   11:04 AM 12/18/2021    1:05 PM  Advanced Directives  Does Patient Have a Medical Advance Directive? No No No No No No No  Would patient like information on creating a medical advance directive? No - Patient declined No - Patient declined;No - Guardian declined No - Patient declined No - Patient declined  No - Patient declined No - Patient declined    Current Medications (verified) Outpatient Encounter Medications as of 12/26/2023  Medication Sig   acetaminophen (TYLENOL) 325 MG tablet Take 2 tablets (650 mg total) by mouth every 6 (six) hours as needed for mild pain (pain score 1-3) (or Fever >/= 101).   acetaminophen (TYLENOL) 500 MG tablet Take 500 mg by mouth every 6 (six) hours as needed for headache (pain).   alendronate (FOSAMAX) 70 MG tablet TAKE 1 TABLET BY MOUTH 1 TIME A WEEK EARLY IN THE MORNING. DO NOT LIE DOWN OR HAVE ANYTHING BUT  WATER TO DRINK FOR 30 MINUTES   alendronate (FOSAMAX) 70 MG tablet Take 70 mg by mouth once a week. Saturday 10/20/23 last dose.   aspirin 81 MG chewable tablet Chew 1 tablet (81 mg total) by mouth daily.   fluticasone furoate-vilanterol (BREO ELLIPTA) 200-25 MCG/ACT AEPB Inhale 1 puff into the lungs daily.   folic acid (FOLVITE) 1 MG tablet Take 1 tablet (1 mg total) by mouth daily.   metoprolol tartrate (LOPRESSOR) 25 MG tablet Take 25 mg by mouth daily.   metoprolol tartrate (LOPRESSOR) 25 MG tablet Take 25 mg by mouth 2 (two) times daily.   Multiple Vitamin (MULTIVITAMIN WITH MINERALS) TABS tablet Take 1 tablet by mouth at bedtime.   Multiple Vitamin (MULTIVITAMIN WITH MINERALS) TABS tablet Take 1 tablet by mouth daily.   oxyCODONE (OXY IR/ROXICODONE) 5 MG immediate release tablet Take 1 tablet (5 mg total) by mouth every 6 (six) hours as needed for severe pain (pain score 7-10).   Pramoxine-HC (HYDROCORTISONE ACE-PRAMOXINE) 2.5-1 % CREA Apply 1 application topically 2 (two) times daily as needed (hemorrhoids).   PREVIDENT 5000 BOOSTER PLUS 1.1 % PSTE Place onto teeth as directed.   spironolactone (ALDACTONE) 25 MG tablet Take 0.5 tablets (12.5 mg total) by mouth daily. (Patient taking differently: Take 12.5 mg by mouth daily. Per patient haven't had to use.)   thiamine (VITAMIN B1) 100 MG tablet Take 1 tablet (100 mg  total) by mouth daily.   No facility-administered encounter medications on file as of 12/26/2023.    Allergies (verified) Codeine and Codeine   History: Past Medical History:  Diagnosis Date   COPD (chronic obstructive pulmonary disease) (HCC)    Osteoporosis    Underweight    History reviewed. No pertinent surgical history. Family History  Problem Relation Age of Onset   Heart disease Father    Hypertension Brother    Social History   Socioeconomic History   Marital status: Single    Spouse name: Not on file   Number of children: Not on file   Years of  education: Not on file   Highest education level: Not on file  Occupational History   Not on file  Tobacco Use   Smoking status: Every Day    Current packs/day: 0.50    Average packs/day: 0.5 packs/day for 40.2 years (20.1 ttl pk-yrs)    Types: Cigarettes    Start date: 55   Smokeless tobacco: Never  Vaping Use   Vaping status: Never Used  Substance and Sexual Activity   Alcohol use: Yes    Alcohol/week: 14.0 standard drinks of alcohol    Types: 14 Glasses of wine per week    Comment: last drink was night of 10/22/22   Drug use: Never   Sexual activity: Not Currently  Other Topics Concern   Not on file  Social History Narrative   ** Merged History Encounter **       Social Drivers of Health   Financial Resource Strain: Low Risk  (12/26/2023)   Overall Financial Resource Strain (CARDIA)    Difficulty of Paying Living Expenses: Not hard at all  Food Insecurity: No Food Insecurity (12/26/2023)   Hunger Vital Sign    Worried About Running Out of Food in the Last Year: Never true    Ran Out of Food in the Last Year: Never true  Transportation Needs: No Transportation Needs (12/26/2023)   PRAPARE - Administrator, Civil Service (Medical): No    Lack of Transportation (Non-Medical): No  Physical Activity: Insufficiently Active (12/26/2023)   Exercise Vital Sign    Days of Exercise per Week: 2 days    Minutes of Exercise per Session: 20 min  Stress: No Stress Concern Present (12/26/2023)   [employer] of Occupational Health - Occupational Stress Questionnaire    Feeling of Stress : Not at all  Social Connections: Socially Isolated (12/26/2023)   Social Connection and Isolation Panel [NHANES]    Frequency of Communication with Friends and Family: More than three times a week    Frequency of Social Gatherings with Friends and Family: Three times a week    Attends Religious Services: Never    Active Member of Clubs or Organizations: No    Attends Museum/gallery exhibitions officer: Never    Marital Status: Divorced    Tobacco Counseling Ready to quit: Not Answered Counseling given: Not Answered   Clinical Intake:  Pre-visit preparation completed: Yes  Pain : No/denies pain Pain Score: 0-No pain     Nutritional Risks: None Diabetes: No  How often do you need to have someone help you when you read instructions, pamphlets, or other written materials from your doctor or pharmacy?: 1 - Never What is the last grade level you completed in school?: college  Interpreter Needed?: No  Information entered by :: Derrell Lolling   Activities of Daily Living    12/26/2023  1:37 PM 12/18/2023    2:54 PM  In your present state of health, do you have any difficulty performing the following activities:  Hearing? 0 0  Vision? 0 0  Difficulty concentrating or making decisions? 0 0  Walking or climbing stairs? 0 0  Dressing or bathing? 0 0  Doing errands, shopping? 0 0  Preparing Food and eating ? N   Using the Toilet? N   In the past six months, have you accidently leaked urine? N   Do you have problems with loss of bowel control? N   Managing your Medications? N   Managing your Finances? N     Patient Care Team: Modena Slater, DO as PCP - General (Internal Medicine) Corky Crafts, MD as PCP - Cardiology (Cardiology) Annamaria Helling, MD (Obstetrics and Gynecology)  Indicate any recent Medical Services you may have received from other than [facility] providers in the past year (date may be approximate).     Assessment:   This is a routine wellness examination for Myrah.  Hearing/Vision screen No results found.   Goals Addressed   None   Depression Screen    12/26/2023    1:40 PM 12/18/2023    2:54 PM 10/31/2023    2:40 PM 05/09/2022    1:09 PM  PHQ 2/9 Scores  PHQ - 2 Score 0 0 0 0  PHQ- 9 Score    1    Fall Risk    12/26/2023    1:41 PM 12/18/2023    2:54 PM 10/31/2023    2:40 PM 05/09/2022    1:08 PM  Fall Risk   Falls  in the past year? 1 1 1 1   Number falls in past yr: 0 0 0 0  Injury with Fall? 1 1 1 1   Risk for fall due to : Impaired balance/gait Impaired balance/gait Impaired balance/gait   Follow up Falls prevention discussed;Falls evaluation completed Falls prevention discussed;Falls evaluation completed Falls evaluation completed;Falls prevention discussed     MEDICARE RISK AT HOME: Medicare Risk at Home Any stairs in or around the home?: Yes If so, are there any without handrails?: No Home free of loose throw rugs in walkways, pet beds, electrical cords, etc?: No Adequate lighting in your home to reduce risk of falls?: Yes Life alert?: No Use of a cane, walker or w/c?: No Grab bars in the bathroom?: Yes Shower chair or bench in shower?: No Elevated toilet seat or a handicapped toilet?: No  TIMED UP AND GO:  Was the test performed? No    Cognitive Function:        12/26/2023    1:42 PM  6CIT Screen  What Year? 0 points  What month? 0 points  What time? 0 points  Count back from 20 0 points  Months in reverse 0 points  Repeat phrase 0 points  Total Score 0 points    Immunizations Immunization History  Administered Date(s) Administered   Tdap 10/24/2023    TDAP status: Up to date  Flu Vaccine status: Due, Education has been provided regarding the importance of this vaccine. Advised may receive this vaccine at local pharmacy or Health Dept. Aware to provide a copy of the vaccination record if obtained from local pharmacy or Health Dept. Verbalized acceptance and understanding.  Pneumococcal vaccine status: Due, Education has been provided regarding the importance of this vaccine. Advised may receive this vaccine at local pharmacy or Health Dept. Aware to provide a copy of the vaccination record if  obtained from local pharmacy or Health Dept. Verbalized acceptance and understanding.  Covid-19 vaccine status: Completed vaccines  Qualifies for Shingles Vaccine? Yes   Zostavax  completed Yes   Shingrix Completed?: Yes  Screening Tests Health Maintenance  Topic Date Due   Pneumonia Vaccine 59+ Years old (1 of 2 - PCV) Never done   Colonoscopy  Never done   MAMMOGRAM  Never done   Zoster Vaccines- Shingrix (1 of 2) Never done   INFLUENZA VACCINE  Never done   COVID-19 Vaccine (1 - 2024-25 season) Never done   Lung Cancer Screening  10/23/2024   Medicare Annual Wellness (AWV)  12/25/2024   DTaP/Tdap/Td (2 - Td or Tdap) 10/23/2033   DEXA SCAN  Completed   Hepatitis C Screening  Completed   HPV VACCINES  Aged Out    Health Maintenance  Health Maintenance Due  Topic Date Due   Pneumonia Vaccine 55+ Years old (1 of 2 - PCV) Never done   Colonoscopy  Never done   MAMMOGRAM  Never done   Zoster Vaccines- Shingrix (1 of 2) Never done   INFLUENZA VACCINE  Never done   COVID-19 Vaccine (1 - 2024-25 season) Never done    Colorectal cancer screening: Type of screening: color guard . Completed 3 years ago . Repeat every 3  years  Mammogram status: Completed 1 year ago . Repeat every year  Bone Density status: Completed 12/03/2015. Results reflect: Bone density results: NORMAL. Repeat every 5 years.  Lung Cancer Screening: (Low Dose CT Chest recommended if Age 32-80 years, 20 pack-year currently smoking OR have quit w/in 15years.) does not qualify.   Lung Cancer Screening Referral: Deferred to pcp   Additional Screening:  Hepatitis C Screening: does qualify; Completed 11/28/2021  Vision Screening: Recommended annual ophthalmology exams for early detection of glaucoma and other disorders of the eye. Is the patient up to date with their annual eye exam?  Yes  Who is the provider or what is the name of the office in which the patient attends annual eye exams?  Dr Lucretia Roers  If pt is not established with a provider, would they like to be referred to a provider to establish care? No .   Dental Screening: Recommended annual dental exams for proper oral  hygiene  Diabetic Foot Exam: n/a  Community Resource Referral / Chronic Care Management: CRR required this visit?  No   CCM required this visit?  No     Plan:     I have personally reviewed and noted the following in the patient's chart:   Medical and social history Use of alcohol, tobacco or illicit drugs  Current medications and supplements including opioid prescriptions. Patient is not currently taking opioid prescriptions. Functional ability and status Nutritional status Physical activity Advanced directives List of other physicians Hospitalizations, surgeries, and ER visits in previous 12 months Vitals Screenings to include cognitive, depression, and falls Referrals and appointments  In addition, I have reviewed and discussed with patient certain preventive protocols, quality metrics, and best practice recommendations. A written personalized care plan for preventive services as well as general preventive health recommendations were provided to patient.     Derrell Lolling, CMA   12/26/2023   After Visit Summary: (Declined) Due to this being a telephonic visit, with patients personalized plan was offered to patient but patient Declined AVS at this time   Nurse Notes:  non face to face    Ms. Hisaw , Thank you for taking time to  come for your Medicare Wellness Visit. I appreciate your ongoing commitment to your health goals. Please review the following plan we discussed and let me know if I can assist you in the future.   These are the goals we discussed:  Goals   None     This is a list of the screening recommended for you and due dates:  Health Maintenance  Topic Date Due   Pneumonia Vaccine (1 of 2 - PCV) Never done   Colon Cancer Screening  Never done   Mammogram  Never done   Zoster (Shingles) Vaccine (1 of 2) Never done   Flu Shot  Never done   COVID-19 Vaccine (1 - 2024-25 season) Never done   Screening for Lung Cancer  10/23/2024   Medicare Annual  Wellness Visit  12/25/2024   DTaP/Tdap/Td vaccine (2 - Td or Tdap) 10/23/2033   DEXA scan (bone density measurement)  Completed   Hepatitis C Screening  Completed   HPV Vaccine  Aged Out

## 2024-01-16 NOTE — Progress Notes (Signed)
 Internal Medicine Clinic Attending Charissa Bash, MD

## 2024-02-01 DIAGNOSIS — Z1231 Encounter for screening mammogram for malignant neoplasm of breast: Secondary | ICD-10-CM | POA: Diagnosis not present

## 2024-05-27 ENCOUNTER — Ambulatory Visit (HOSPITAL_BASED_OUTPATIENT_CLINIC_OR_DEPARTMENT_OTHER)
Admission: RE | Admit: 2024-05-27 | Discharge: 2024-05-27 | Disposition: A | Discharge status: Home / Self Care | Source: Ambulatory Visit | Attending: Internal Medicine | Admitting: Internal Medicine

## 2024-05-27 DIAGNOSIS — R911 Solitary pulmonary nodule: Secondary | ICD-10-CM | POA: Diagnosis not present

## 2024-05-27 DIAGNOSIS — J449 Chronic obstructive pulmonary disease, unspecified: Secondary | ICD-10-CM | POA: Diagnosis not present

## 2024-05-27 DIAGNOSIS — R918 Other nonspecific abnormal finding of lung field: Secondary | ICD-10-CM | POA: Diagnosis not present

## 2024-06-09 ENCOUNTER — Telehealth: Payer: Self-pay | Admitting: Student

## 2024-06-09 NOTE — Telephone Encounter (Signed)
 Attempted to call patient regarding CT scan results.  No answer.  Patient does seem to have potential mass noted on right lower lobe.  Would like to obtain PET-CT to further work this up.  Patient also does have a pleural based nodule in the right upper lobe recommending study in 3-6 months.  Will try to contact patient again to see if I can inform patient of results.

## 2024-06-12 ENCOUNTER — Ambulatory Visit: Payer: Self-pay | Admitting: Student

## 2024-06-12 DIAGNOSIS — R911 Solitary pulmonary nodule: Secondary | ICD-10-CM

## 2024-06-12 NOTE — Telephone Encounter (Signed)
 Attempted to call patient with CT scan results.  No answer.  Will go ahead and put in an order for PET scan.  Unable to send MyChart message.

## 2024-06-19 DIAGNOSIS — H524 Presbyopia: Secondary | ICD-10-CM | POA: Diagnosis not present

## 2024-06-23 NOTE — Telephone Encounter (Signed)
 Tried to call again with no answer. Hope patient will call the clinic.

## 2024-06-27 ENCOUNTER — Encounter (HOSPITAL_COMMUNITY): Admission: RE | Admit: 2024-06-27 | Source: Ambulatory Visit

## 2024-07-03 ENCOUNTER — Ambulatory Visit (HOSPITAL_COMMUNITY)
Admission: RE | Admit: 2024-07-03 | Discharge: 2024-07-03 | Disposition: A | Discharge status: Home / Self Care | Source: Ambulatory Visit | Attending: Internal Medicine | Admitting: Internal Medicine

## 2024-07-03 DIAGNOSIS — R911 Solitary pulmonary nodule: Secondary | ICD-10-CM | POA: Diagnosis not present

## 2024-07-03 LAB — GLUCOSE, CAPILLARY: Glucose-Capillary: 91 mg/dL (ref 70–99)

## 2024-07-03 MED ORDER — FLUDEOXYGLUCOSE F - 18 (FDG) INJECTION
5.1000 | Freq: Once | INTRAVENOUS | Status: AC | PRN
  Administered 2024-07-03: 4.9 via INTRAVENOUS

## 2024-07-07 ENCOUNTER — Other Ambulatory Visit: Payer: Self-pay | Admitting: Student

## 2024-07-07 ENCOUNTER — Ambulatory Visit: Payer: Self-pay | Admitting: Student

## 2024-07-07 DIAGNOSIS — R911 Solitary pulmonary nodule: Secondary | ICD-10-CM

## 2024-07-21 NOTE — H&P (View-Only) (Signed)
 New Patient Pulmonology Office Visit   Subjective:  Patient ID: Gina Cherry, female    DOB: 1955/11/25  MRN: [id number]  Referred by: Lovie Clarity, MD  CC:  Chief Complaint  Patient presents with   Establish Care    HPI KELLER MIKELS is a 68 y.o. female with nicotine  dependence with current use (20 PY), emphysema, and multiple pulmonary nodules who presents to establish care.  Discussed the use of AI scribe software for clinical note transcription with the patient, who gave verbal consent to proceed.  History of Present Illness Gina Cherry is a 68 year old female with emphysema who presents with lung nodules identified on imaging. She was referred by Dr. Clarity Lovie for evaluation of lung nodules.  Lung nodules were identified during a hospital stay in January 2025, initially discovered when she was hospitalized due to a fall. Imaging at that time revealed spots on her lungs. A CT scan in January showed three spots in the right lung, and a follow-up CT scan in August confirmed their presence. A recent PET scan indicated increased uptake in the uppermost nodule in the right lung, while the right lower lobe nodule did not.  She has a significant smoking history, smoking half a pack per day for fifty years. No respiratory symptoms such as coughing, phlegm production, or hemoptysis are reported. She experienced unintentional weight loss of six to ten pounds since January 2025.  Her past medical history includes a severe pneumonia in 2023, diagnosed as Legionnaires' disease, requiring a three and a half week hospital stay with ICU admission and intubation. Upon review of discharge summary, patient had developed ARDS requiring IMV and proning. 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 She denies any history of lung biopsies or surgeries.  No family history of lung cancer or other  cancers is reported. 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 She denies exposure to asbestos or other occupational hazards.  She is currently taking Fosamax once weekly for osteoporosis and previously took metoprolol , which she discontinued after suspecting it contributed to her fall.  Symptoms Associated with Lung cancer:   Central Tumor Sx: Dyspnea  Peripheral Tumor Sx: Dyspnea  Sx of Metastasis: Weight loss   Conditions associated with lung cancer & imp to identify prior to bronch: COPD  Hx of anesthesia reactions: none  PMH:  - COPD/Emphysema - Osteoporosis  Important Medications: no blood thinners  Allergies: codeine, no latex allergy  Social History:  Smoking and [employer] Exposure: Tobacco Smoking 25 PY  Family History: no lung or other types of cancer in family  ASA grade:  ASA 2 - Patient with mild systemic disease with no functional limitations  Karnofsky Performance Status: 90 Able to carry on normal activity, minor signs, or symptoms of disease  ECOG Performance Status: (0) Fully active, able to carry on all predisease performance without restriction  CAT score < 10 MMRC < 2 No exacerbations in the last year  ROS  Allergies: Codeine and Codeine  Current Outpatient Medications:    acetaminophen  (TYLENOL ) 325 MG tablet, Take 2 tablets (650 mg total) by mouth every 6 (six) hours as needed for mild pain (pain score 1-3) (or Fever >/= 101)., Disp: 30 tablet, Rfl: 0   acetaminophen  (TYLENOL ) 500 MG tablet, Take 500 mg by mouth every 6 (six)  hours as needed for headache (pain)., Disp: , Rfl:    alendronate (FOSAMAX) 70 MG tablet, TAKE 1 TABLET BY MOUTH 1 TIME A WEEK EARLY IN THE MORNING. DO NOT LIE DOWN OR HAVE ANYTHING BUT WATER  TO DRINK FOR 30 MINUTES, Disp: , Rfl:    alendronate (FOSAMAX) 70 MG tablet, Take 70 mg by mouth once a week. Saturday 10/20/23 last dose., Disp: , Rfl:    aspirin   81 MG chewable tablet, Chew 1 tablet (81 mg total) by mouth daily., Disp: 30 tablet, Rfl: 0   metoprolol  tartrate (LOPRESSOR ) 25 MG tablet, Take 25 mg by mouth daily., Disp: , Rfl:    metoprolol  tartrate (LOPRESSOR ) 25 MG tablet, Take 25 mg by mouth 2 (two) times daily., Disp: , Rfl:    Multiple Vitamin (MULTIVITAMIN WITH MINERALS) TABS tablet, Take 1 tablet by mouth at bedtime., Disp: , Rfl:    Multiple Vitamin (MULTIVITAMIN WITH MINERALS) TABS tablet, Take 1 tablet by mouth daily., Disp: 30 tablet, Rfl: 0   Pramoxine-HC (HYDROCORTISONE ACE-PRAMOXINE) 2.5-1 % CREA, Apply 1 application topically 2 (two) times daily as needed (hemorrhoids)., Disp: , Rfl:    PREVIDENT 5000 BOOSTER PLUS 1.1 % PSTE, Place onto teeth as directed., Disp: , Rfl:  Past Medical History:  Diagnosis Date   COPD (chronic obstructive pulmonary disease) (HCC)    Osteoporosis    Underweight    History reviewed. No pertinent surgical history. Family History  Problem Relation Age of Onset   Heart disease Father    Hypertension Brother    Social History   Socioeconomic History   Marital status: Single    Spouse name: Not on file   Number of children: Not on file   Years of education: Not on file   Highest education level: Not on file  Occupational History   Not on file  Tobacco Use   Smoking status: Every Day    Current packs/day: 0.50    Average packs/day: 0.5 packs/day for 40.8 years (20.4 ttl pk-yrs)    Types: Cigarettes    Start date: 19   Smokeless tobacco: Never  Vaping Use   Vaping status: Never Used  Substance and Sexual Activity   Alcohol use: Yes    Alcohol/week: 14.0 standard drinks of alcohol    Types: 14 Glasses of wine per week    Comment: last drink was night of 10/22/22   Drug use: Never   Sexual activity: Not Currently  Other Topics Concern   Not on file  Social History Narrative   ** Merged History Encounter **       Social Drivers of Health   Financial Resource Strain: Low Risk   (12/26/2023)   Overall Financial Resource Strain (CARDIA)    Difficulty of Paying Living Expenses: Not hard at all  Food Insecurity: No Food Insecurity (12/26/2023)   Hunger Vital Sign    Worried About Running Out of Food in the Last Year: Never true    Ran Out of Food in the Last Year: Never true  Transportation Needs: No Transportation Needs (12/26/2023)   PRAPARE - Administrator, Civil Service (Medical): No    Lack of Transportation (Non-Medical): No  Physical Activity: Insufficiently Active (12/26/2023)   Exercise Vital Sign    Days of Exercise per Week: 2 days    Minutes of Exercise per Session: 20 min  Stress: No Stress Concern Present (12/26/2023)   [employer] of Occupational Health - Occupational Stress Questionnaire    Feeling of  Stress : Not at all  Social Connections: Socially Isolated (12/26/2023)   Social Connection and Isolation Panel    Frequency of Communication with Friends and Family: More than three times a week    Frequency of Social Gatherings with Friends and Family: Three times a week    Attends Religious Services: Never    Active Member of Clubs or Organizations: No    Attends Banker Meetings: Never    Marital Status: Divorced  Catering Manager Violence: Not At Risk (12/26/2023)   Humiliation, Afraid, Rape, and Kick questionnaire    Fear of Current or Ex-Partner: No    Emotionally Abused: No    Physically Abused: No    Sexually Abused: No       Objective:  BP (!) 166/92   Pulse 100   Ht 5' 4 (1.626 m)   Wt 89 lb (40.4 kg)   SpO2 90%   BMI 15.28 kg/m  Wt Readings from Last 3 Encounters:  07/22/24 89 lb (40.4 kg)  12/18/23 92 lb 11.2 oz (42 kg)  11/27/23 96 lb 1.6 oz (43.6 kg)   BMI Readings from Last 3 Encounters:  07/22/24 15.28 kg/m  12/18/23 15.91 kg/m  11/27/23 18.77 kg/m   SpO2 Readings from Last 3 Encounters:  07/22/24 90%  12/18/23 93%  11/27/23 99%    Physical Exam General: thin, chronically ill  appearing, not in any acute distress Eyes: PERRL, no scleral icterus ENMT: oropharynx clear, good dentition, no oral lesions, mallampati score I Skin: warm, intact, no rashes Neck: JVD flat, ROM and lymph node assessment normal CV: RRR, no MRG, nl S1 and S2, no peripheral edema Resp: prolonged expiratory phase, no added lung sounds, nicotine  stains on nails and clubbing Abdom: Normoactive bowel sounds, soft, nontender, nondistended, no hepatosplenomegaly Neuro: Awake alert oriented to person place time and situation  Diagnostic Review:  Last CBC Lab Results  Component Value Date   WBC 12.2 (H) 10/25/2023   HGB 11.7 (L) 10/25/2023   HCT 33.4 (L) 10/25/2023   MCV 108.4 (H) 10/25/2023   MCH 38.0 (H) 10/25/2023   RDW 13.8 10/25/2023   PLT 228 10/25/2023   Last metabolic panel Lab Results  Component Value Date   GLUCOSE 100 (H) 10/31/2023   NA 138 10/31/2023   K 4.1 10/31/2023   CL 98 10/31/2023   CO2 23 10/31/2023   BUN 6 (L) 10/31/2023   CREATININE 0.52 (L) 10/31/2023   EGFR 101 10/31/2023   CALCIUM  9.4 10/31/2023   PHOS 4.3 12/13/2021   PROT 4.3 (L) 10/24/2023   ALBUMIN 2.5 (L) 10/24/2023   BILITOT 0.7 10/24/2023   ALKPHOS 38 10/24/2023   AST 75 (H) 10/24/2023   ALT 36 10/24/2023   ANIONGAP 12 10/25/2023   CT Chest 10/24/2023: Lungs/Pleura: Centrilobular emphysema. Major airways are patent with trace layering secretions in the right bronchus intermedius on series 5, image 100. No pneumothorax. No pleural effusion. Curvilinear, platelike, and partially nodular and calcified opacity in the right lower lobe most resembles postinflammatory scarring, with right lung base opacification in 2023. Nearby small calcified granuloma in the medial basal segment on series 5, image 140. No pulmonary contusion identified.   However, indeterminate appearing 14 mm medial right apical solid lung nodule also on series 6, image 40, series 5, image 45.  CT Chest  06/08/2024: IMPRESSION: 1. Increasingly broad band of consolidation in the right lower lobe, contiguous with the lower hilar structures, extending laterally to the pleural surface and also extending  posteriorly. This could be due to progression of the previously noted scarring process or due to infection or aspiration. Underlying mass is difficult to exclude in the lower lateral perihilar area where there is a 2.0 x 1.9 cm area within the consolidation without air bronchograms. Consider PET-CT. 2. Stable 1.8 x 1.4 cm partially pleural-based nodule in the medial right upper lobe apex. 3-6 month follow-up study recommended. 3. Increased circumferential pericardial fluid, maximum thickness 1.5 cm on the right. 4. Aortic and coronary artery atherosclerosis. 5. Emphysema. 6. Trace pleural effusions new from the prior study. 7. Paucity of body fat with interval worsening since the prior study consistent with history of malnutrition.  PET/CT 07/06/2024: IMPRESSION: 1. 1.4 x 1.0 cm soft tissue nodule along the pleura of the medial right apex is hypermetabolic and concerning for primary bronchogenic neoplasm. 2. Atypical cystic lesion in the right upper lobe potentially with a tiny nodular component shows low level hypermetabolism. Given the hypermetabolic activity in this region with relative lack of soft tissue component, neoplasm is a concern with synchronous primary a distinct consideration. 3. Consolidative opacity in the right lower lobe including a 1.8 cm nodular component shows no substantial hypermetabolism, likely post infectious/inflammatory. 4. Tiny hypermetabolic focus in the region of the anterior anus. Correlation with physical exam recommended to exclude mucosal lesion. 5. Hepatic steatosis. 6. Emphysema. (PRI89-G56.9) Aortic Atherosclerois (ICD10-170.0)    Assessment & Plan:   Assessment & Plan Nicotine  dependence with current use Smoking assessment and cessation  counseling  Patient currently smoking:  I spent 8 minutes discussing the continuing health risks associated with smoking. I offered the patient tobacco cessation treatment options such as medication and behavioral interventions to help facilitate smoking cessation. I do not advise e-cigarettes as a form of stopping smoking. I have informed the patient that we can assist and have options of nicotine  replacement therapy, provided smoking cessation education today, provided smoking cessation counseling, and provided cessation resources.  Smoking cessation counseling advised for: CPT 99406 3 to 10 minutes Multiple lung nodules  Centrilobular emphysema (HCC)  Anal lesion   Orders Placed This Encounter  Procedures   CT Super D Chest Wo Contrast   Ambulatory referral to Gastroenterology   Pulmonary function test   Assessment & Plan Pulmonary nodules of the right lung (upper and lower lobes) Multiple nodules in the right lung. Apical and medial nodule shows significant PET uptake, suggesting malignancy. Lower lobe nodule likely scar tissue from prior hx of ARDS due to legionnaire's disease. There's an airway going up to apical lesion so I suspect it maybe amenable to biopsy with navigational bronchoscopy (ION platform). Differential includes malignant, infectious, or benign process. - Discussed biopsy risks: pneumothorax, bleeding, infection (3% major complications). - Discussed empiric SBRT if biopsy not feasible. - Discussed pursuing Super D CT if patient is amenable to get biopsy - The patient would like to take some time to think about undergoing biopsy and will reach back out to us  for further direction. - Consider radiation oncologist referral if biopsy not pursued or inconclusive for empiric SBRT.  Emphysema Emphysema likely due to tobacco use. Evident on chest imaging. MMRC < 2, CAT < 10. Likely Class A. I ordered PFTs for formal evaluation and to get baseline lung function which would  help us  tailor management of pulmonary nodules. - Order pulmonary function tests.  Tobacco use disorder Long-standing tobacco use disorder, smoking half a pack per day for 50 years. Discussed cessation benefits and nicotine  replacement therapy. - Provide  information on 1-800-QUIT-NOW for smoking cessation support.  Rectal lesion identified on PET scan, evaluation pending Lesion near rectum on PET scan, possibly correlating with hemorrhoids. Further evaluation needed to rule out malignancy. - Refer to gastroenterologist for evaluation with rectal exam and consideration of colonoscopy.  Follow up: 08/19/2024.  I spent 62 minutes reviewing patient's chart including prior consultant notes, imaging, and PFTs as well as face-to-face with the patient, over half in discussion of the diagnosis and the importance of compliance with the treatment plan.  Amorita Vanrossum, MD

## 2024-07-21 NOTE — Progress Notes (Signed)
 New Patient Pulmonology Office Visit   Subjective:  Patient ID: Gina Cherry, female    DOB: 1955/11/25  MRN: [id number]  Referred by: Lovie Clarity, MD  CC:  Chief Complaint  Patient presents with   Establish Care    HPI Gina Cherry is a 68 y.o. female with nicotine  dependence with current use (20 PY), emphysema, and multiple pulmonary nodules who presents to establish care.  Discussed the use of AI scribe software for clinical note transcription with the patient, who gave verbal consent to proceed.  History of Present Illness Gina Cherry is a 68 year old female with emphysema who presents with lung nodules identified on imaging. She was referred by Dr. Clarity Lovie for evaluation of lung nodules.  Lung nodules were identified during a hospital stay in January 2025, initially discovered when she was hospitalized due to a fall. Imaging at that time revealed spots on her lungs. A CT scan in January showed three spots in the right lung, and a follow-up CT scan in August confirmed their presence. A recent PET scan indicated increased uptake in the uppermost nodule in the right lung, while the right lower lobe nodule did not.  She has a significant smoking history, smoking half a pack per day for fifty years. No respiratory symptoms such as coughing, phlegm production, or hemoptysis are reported. She experienced unintentional weight loss of six to ten pounds since January 2025.  Her past medical history includes a severe pneumonia in 2023, diagnosed as Legionnaires' disease, requiring a three and a half week hospital stay with ICU admission and intubation. Upon review of discharge summary, patient had developed ARDS requiring IMV and proning. 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 She denies any history of lung biopsies or surgeries.  No family history of lung cancer or other  cancers is reported. 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 She denies exposure to asbestos or other occupational hazards.  She is currently taking Fosamax once weekly for osteoporosis and previously took metoprolol , which she discontinued after suspecting it contributed to her fall.  Symptoms Associated with Lung cancer:   Central Tumor Sx: Dyspnea  Peripheral Tumor Sx: Dyspnea  Sx of Metastasis: Weight loss   Conditions associated with lung cancer & imp to identify prior to bronch: COPD  Hx of anesthesia reactions: none  PMH:  - COPD/Emphysema - Osteoporosis  Important Medications: no blood thinners  Allergies: codeine, no latex allergy  Social History:  Smoking and [employer] Exposure: Tobacco Smoking 25 PY  Family History: no lung or other types of cancer in family  ASA grade:  ASA 2 - Patient with mild systemic disease with no functional limitations  Karnofsky Performance Status: 90 Able to carry on normal activity, minor signs, or symptoms of disease  ECOG Performance Status: (0) Fully active, able to carry on all predisease performance without restriction  CAT score < 10 MMRC < 2 No exacerbations in the last year  ROS  Allergies: Codeine and Codeine  Current Outpatient Medications:    acetaminophen  (TYLENOL ) 325 MG tablet, Take 2 tablets (650 mg total) by mouth every 6 (six) hours as needed for mild pain (pain score 1-3) (or Fever >/= 101)., Disp: 30 tablet, Rfl: 0   acetaminophen  (TYLENOL ) 500 MG tablet, Take 500 mg by mouth every 6 (six)  hours as needed for headache (pain)., Disp: , Rfl:    alendronate (FOSAMAX) 70 MG tablet, TAKE 1 TABLET BY MOUTH 1 TIME A WEEK EARLY IN THE MORNING. DO NOT LIE DOWN OR HAVE ANYTHING BUT WATER  TO DRINK FOR 30 MINUTES, Disp: , Rfl:    alendronate (FOSAMAX) 70 MG tablet, Take 70 mg by mouth once a week. Saturday 10/20/23 last dose., Disp: , Rfl:    aspirin   81 MG chewable tablet, Chew 1 tablet (81 mg total) by mouth daily., Disp: 30 tablet, Rfl: 0   metoprolol  tartrate (LOPRESSOR ) 25 MG tablet, Take 25 mg by mouth daily., Disp: , Rfl:    metoprolol  tartrate (LOPRESSOR ) 25 MG tablet, Take 25 mg by mouth 2 (two) times daily., Disp: , Rfl:    Multiple Vitamin (MULTIVITAMIN WITH MINERALS) TABS tablet, Take 1 tablet by mouth at bedtime., Disp: , Rfl:    Multiple Vitamin (MULTIVITAMIN WITH MINERALS) TABS tablet, Take 1 tablet by mouth daily., Disp: 30 tablet, Rfl: 0   Pramoxine-HC (HYDROCORTISONE ACE-PRAMOXINE) 2.5-1 % CREA, Apply 1 application topically 2 (two) times daily as needed (hemorrhoids)., Disp: , Rfl:    PREVIDENT 5000 BOOSTER PLUS 1.1 % PSTE, Place onto teeth as directed., Disp: , Rfl:  Past Medical History:  Diagnosis Date   COPD (chronic obstructive pulmonary disease) (HCC)    Osteoporosis    Underweight    History reviewed. No pertinent surgical history. Family History  Problem Relation Age of Onset   Heart disease Father    Hypertension Brother    Social History   Socioeconomic History   Marital status: Single    Spouse name: Not on file   Number of children: Not on file   Years of education: Not on file   Highest education level: Not on file  Occupational History   Not on file  Tobacco Use   Smoking status: Every Day    Current packs/day: 0.50    Average packs/day: 0.5 packs/day for 40.8 years (20.4 ttl pk-yrs)    Types: Cigarettes    Start date: 20   Smokeless tobacco: Never  Vaping Use   Vaping status: Never Used  Substance and Sexual Activity   Alcohol use: Yes    Alcohol/week: 14.0 standard drinks of alcohol    Types: 14 Glasses of wine per week    Comment: last drink was night of 10/22/22   Drug use: Never   Sexual activity: Not Currently  Other Topics Concern   Not on file  Social History Narrative   ** Merged History Encounter **       Social Drivers of Health   Financial Resource Strain: Low Risk   (12/26/2023)   Overall Financial Resource Strain (CARDIA)    Difficulty of Paying Living Expenses: Not hard at all  Food Insecurity: No Food Insecurity (12/26/2023)   Hunger Vital Sign    Worried About Running Out of Food in the Last Year: Never true    Ran Out of Food in the Last Year: Never true  Transportation Needs: No Transportation Needs (12/26/2023)   PRAPARE - Administrator, Civil Service (Medical): No    Lack of Transportation (Non-Medical): No  Physical Activity: Insufficiently Active (12/26/2023)   Exercise Vital Sign    Days of Exercise per Week: 2 days    Minutes of Exercise per Session: 20 min  Stress: No Stress Concern Present (12/26/2023)   [employer] of Occupational Health - Occupational Stress Questionnaire    Feeling of  Stress : Not at all  Social Connections: Socially Isolated (12/26/2023)   Social Connection and Isolation Panel    Frequency of Communication with Friends and Family: More than three times a week    Frequency of Social Gatherings with Friends and Family: Three times a week    Attends Religious Services: Never    Active Member of Clubs or Organizations: No    Attends Banker Meetings: Never    Marital Status: Divorced  Catering Manager Violence: Not At Risk (12/26/2023)   Humiliation, Afraid, Rape, and Kick questionnaire    Fear of Current or Ex-Partner: No    Emotionally Abused: No    Physically Abused: No    Sexually Abused: No       Objective:  BP (!) 166/92   Pulse 100   Ht 5' 4 (1.626 m)   Wt 89 lb (40.4 kg)   SpO2 90%   BMI 15.28 kg/m  Wt Readings from Last 3 Encounters:  07/22/24 89 lb (40.4 kg)  12/18/23 92 lb 11.2 oz (42 kg)  11/27/23 96 lb 1.6 oz (43.6 kg)   BMI Readings from Last 3 Encounters:  07/22/24 15.28 kg/m  12/18/23 15.91 kg/m  11/27/23 18.77 kg/m   SpO2 Readings from Last 3 Encounters:  07/22/24 90%  12/18/23 93%  11/27/23 99%    Physical Exam General: thin, chronically ill  appearing, not in any acute distress Eyes: PERRL, no scleral icterus ENMT: oropharynx clear, good dentition, no oral lesions, mallampati score I Skin: warm, intact, no rashes Neck: JVD flat, ROM and lymph node assessment normal CV: RRR, no MRG, nl S1 and S2, no peripheral edema Resp: prolonged expiratory phase, no added lung sounds, nicotine  stains on nails and clubbing Abdom: Normoactive bowel sounds, soft, nontender, nondistended, no hepatosplenomegaly Neuro: Awake alert oriented to person place time and situation  Diagnostic Review:  Last CBC Lab Results  Component Value Date   WBC 12.2 (H) 10/25/2023   HGB 11.7 (L) 10/25/2023   HCT 33.4 (L) 10/25/2023   MCV 108.4 (H) 10/25/2023   MCH 38.0 (H) 10/25/2023   RDW 13.8 10/25/2023   PLT 228 10/25/2023   Last metabolic panel Lab Results  Component Value Date   GLUCOSE 100 (H) 10/31/2023   NA 138 10/31/2023   K 4.1 10/31/2023   CL 98 10/31/2023   CO2 23 10/31/2023   BUN 6 (L) 10/31/2023   CREATININE 0.52 (L) 10/31/2023   EGFR 101 10/31/2023   CALCIUM  9.4 10/31/2023   PHOS 4.3 12/13/2021   PROT 4.3 (L) 10/24/2023   ALBUMIN 2.5 (L) 10/24/2023   BILITOT 0.7 10/24/2023   ALKPHOS 38 10/24/2023   AST 75 (H) 10/24/2023   ALT 36 10/24/2023   ANIONGAP 12 10/25/2023   CT Chest 10/24/2023: Lungs/Pleura: Centrilobular emphysema. Major airways are patent with trace layering secretions in the right bronchus intermedius on series 5, image 100. No pneumothorax. No pleural effusion. Curvilinear, platelike, and partially nodular and calcified opacity in the right lower lobe most resembles postinflammatory scarring, with right lung base opacification in 2023. Nearby small calcified granuloma in the medial basal segment on series 5, image 140. No pulmonary contusion identified.   However, indeterminate appearing 14 mm medial right apical solid lung nodule also on series 6, image 40, series 5, image 45.  CT Chest  06/08/2024: IMPRESSION: 1. Increasingly broad band of consolidation in the right lower lobe, contiguous with the lower hilar structures, extending laterally to the pleural surface and also extending  posteriorly. This could be due to progression of the previously noted scarring process or due to infection or aspiration. Underlying mass is difficult to exclude in the lower lateral perihilar area where there is a 2.0 x 1.9 cm area within the consolidation without air bronchograms. Consider PET-CT. 2. Stable 1.8 x 1.4 cm partially pleural-based nodule in the medial right upper lobe apex. 3-6 month follow-up study recommended. 3. Increased circumferential pericardial fluid, maximum thickness 1.5 cm on the right. 4. Aortic and coronary artery atherosclerosis. 5. Emphysema. 6. Trace pleural effusions new from the prior study. 7. Paucity of body fat with interval worsening since the prior study consistent with history of malnutrition.  PET/CT 07/06/2024: IMPRESSION: 1. 1.4 x 1.0 cm soft tissue nodule along the pleura of the medial right apex is hypermetabolic and concerning for primary bronchogenic neoplasm. 2. Atypical cystic lesion in the right upper lobe potentially with a tiny nodular component shows low level hypermetabolism. Given the hypermetabolic activity in this region with relative lack of soft tissue component, neoplasm is a concern with synchronous primary a distinct consideration. 3. Consolidative opacity in the right lower lobe including a 1.8 cm nodular component shows no substantial hypermetabolism, likely post infectious/inflammatory. 4. Tiny hypermetabolic focus in the region of the anterior anus. Correlation with physical exam recommended to exclude mucosal lesion. 5. Hepatic steatosis. 6. Emphysema. (PRI89-G56.9) Aortic Atherosclerois (ICD10-170.0)    Assessment & Plan:   Assessment & Plan Nicotine  dependence with current use Smoking assessment and cessation  counseling  Patient currently smoking:  I spent 8 minutes discussing the continuing health risks associated with smoking. I offered the patient tobacco cessation treatment options such as medication and behavioral interventions to help facilitate smoking cessation. I do not advise e-cigarettes as a form of stopping smoking. I have informed the patient that we can assist and have options of nicotine  replacement therapy, provided smoking cessation education today, provided smoking cessation counseling, and provided cessation resources.  Smoking cessation counseling advised for: CPT 99406 3 to 10 minutes Multiple lung nodules  Centrilobular emphysema (HCC)  Anal lesion   Orders Placed This Encounter  Procedures   CT Super D Chest Wo Contrast   Ambulatory referral to Gastroenterology   Pulmonary function test   Assessment & Plan Pulmonary nodules of the right lung (upper and lower lobes) Multiple nodules in the right lung. Apical and medial nodule shows significant PET uptake, suggesting malignancy. Lower lobe nodule likely scar tissue from prior hx of ARDS due to legionnaire's disease. There's an airway going up to apical lesion so I suspect it maybe amenable to biopsy with navigational bronchoscopy (ION platform). Differential includes malignant, infectious, or benign process. - Discussed biopsy risks: pneumothorax, bleeding, infection (3% major complications). - Discussed empiric SBRT if biopsy not feasible. - Discussed pursuing Super D CT if patient is amenable to get biopsy - The patient would like to take some time to think about undergoing biopsy and will reach back out to us  for further direction. - Consider radiation oncologist referral if biopsy not pursued or inconclusive for empiric SBRT.  Emphysema Emphysema likely due to tobacco use. Evident on chest imaging. MMRC < 2, CAT < 10. Likely Class A. I ordered PFTs for formal evaluation and to get baseline lung function which would  help us  tailor management of pulmonary nodules. - Order pulmonary function tests.  Tobacco use disorder Long-standing tobacco use disorder, smoking half a pack per day for 50 years. Discussed cessation benefits and nicotine  replacement therapy. - Provide  information on 1-800-QUIT-NOW for smoking cessation support.  Rectal lesion identified on PET scan, evaluation pending Lesion near rectum on PET scan, possibly correlating with hemorrhoids. Further evaluation needed to rule out malignancy. - Refer to gastroenterologist for evaluation with rectal exam and consideration of colonoscopy.  Follow up: 08/19/2024.  I spent 62 minutes reviewing patient's chart including prior consultant notes, imaging, and PFTs as well as face-to-face with the patient, over half in discussion of the diagnosis and the importance of compliance with the treatment plan.  Amorita Vanrossum, MD

## 2024-07-22 ENCOUNTER — Ambulatory Visit (HOSPITAL_BASED_OUTPATIENT_CLINIC_OR_DEPARTMENT_OTHER): Admitting: Pulmonary Disease

## 2024-07-22 ENCOUNTER — Encounter (HOSPITAL_BASED_OUTPATIENT_CLINIC_OR_DEPARTMENT_OTHER): Payer: Self-pay | Admitting: Pulmonary Disease

## 2024-07-22 VITALS — BP 166/92 | HR 100 | Ht 64.0 in | Wt 89.0 lb

## 2024-07-22 DIAGNOSIS — K629 Disease of anus and rectum, unspecified: Secondary | ICD-10-CM

## 2024-07-22 DIAGNOSIS — F17200 Nicotine dependence, unspecified, uncomplicated: Secondary | ICD-10-CM

## 2024-07-22 DIAGNOSIS — R918 Other nonspecific abnormal finding of lung field: Secondary | ICD-10-CM

## 2024-07-22 DIAGNOSIS — R9389 Abnormal findings on diagnostic imaging of other specified body structures: Secondary | ICD-10-CM | POA: Diagnosis not present

## 2024-07-22 DIAGNOSIS — F17210 Nicotine dependence, cigarettes, uncomplicated: Secondary | ICD-10-CM

## 2024-07-22 DIAGNOSIS — J439 Emphysema, unspecified: Secondary | ICD-10-CM | POA: Diagnosis not present

## 2024-07-22 DIAGNOSIS — J432 Centrilobular emphysema: Secondary | ICD-10-CM

## 2024-07-22 NOTE — Patient Instructions (Addendum)
-   Let me know if you want to pursue the biopsy - If you decide on biopsy, get the CT scan scheduled and I'll work on finding a spot for the biopsy - Get breathing test done - 4 weeks follow up - Call [phone]

## 2024-07-25 ENCOUNTER — Telehealth (HOSPITAL_BASED_OUTPATIENT_CLINIC_OR_DEPARTMENT_OTHER): Payer: Self-pay | Admitting: Pulmonary Disease

## 2024-07-25 ENCOUNTER — Telehealth (HOSPITAL_BASED_OUTPATIENT_CLINIC_OR_DEPARTMENT_OTHER): Payer: Self-pay

## 2024-07-25 DIAGNOSIS — R918 Other nonspecific abnormal finding of lung field: Secondary | ICD-10-CM | POA: Insufficient documentation

## 2024-07-25 NOTE — Telephone Encounter (Signed)
Orders placed for bronch

## 2024-07-25 NOTE — Telephone Encounter (Signed)
 Please schedule the following:  Provider performing procedure: Byrum Diagnosis: nodule rul Which side if for nodule / mass? right Procedure: ion navigation bronch  Has patient been spoken to by Provider and given informed consent? yes Anesthesia: general Do you need Fluro? yes Duration of procedure: 1.5 Date: 10/27 Alternate Date: 10/28  Time: Any Location: [facility] Does patient have OSA? No DM? No Or Latex allergy? No Medication Restriction/ Anticoagulate/Antiplatelet: none Pre-op Labs Ordered:determined by Anesthesia Imaging request: super  d ct  (If, SuperDimension CT Chest, please have STAT courier sent to ENDO)

## 2024-07-25 NOTE — Telephone Encounter (Signed)
 Spoke with Mrs. Gina Cherry per her conversation during the office visit with Dr. Paulina, she would like to proceed with a biopsy. A message was sent to the provider for review and further advisement before proceeding. When received response from the provider will update the patient accordingly.

## 2024-07-25 NOTE — Telephone Encounter (Signed)
 Copied from CRM [id number]. Topic: Clinical - Request for Lab/Test Order >> Jul 25, 2024  1:24 PM Rilla B wrote: Reason for CRM: Patient saw Dr Sinclaire on 10/07.  Provider states he wanted patient have a biopsy and if she had not heard from the office by today, she was to call.  Please call patient at [phone]

## 2024-07-28 ENCOUNTER — Encounter: Payer: Self-pay | Admitting: Pulmonary Disease

## 2024-07-28 ENCOUNTER — Encounter: Payer: Self-pay | Admitting: Emergency Medicine

## 2024-07-28 NOTE — Telephone Encounter (Signed)
 Bronch scheduled case #[id number] will send to [facility] to check auth

## 2024-07-29 NOTE — Telephone Encounter (Signed)
 Spoke with Gina Cherry she stated that someone called yesterday and left a voicemail with information regarding Bronch appointment. Reconfirmed the date and time with her, she is aware. Nothing further needed at this time.

## 2024-08-01 ENCOUNTER — Ambulatory Visit (HOSPITAL_BASED_OUTPATIENT_CLINIC_OR_DEPARTMENT_OTHER)
Admission: RE | Admit: 2024-08-01 | Discharge: 2024-08-01 | Disposition: A | Discharge status: Home / Self Care | Source: Ambulatory Visit | Attending: Pulmonary Disease | Admitting: Pulmonary Disease

## 2024-08-01 DIAGNOSIS — F17200 Nicotine dependence, unspecified, uncomplicated: Secondary | ICD-10-CM | POA: Insufficient documentation

## 2024-08-01 DIAGNOSIS — I700 Atherosclerosis of aorta: Secondary | ICD-10-CM | POA: Diagnosis not present

## 2024-08-01 DIAGNOSIS — R918 Other nonspecific abnormal finding of lung field: Secondary | ICD-10-CM | POA: Insufficient documentation

## 2024-08-01 DIAGNOSIS — R911 Solitary pulmonary nodule: Secondary | ICD-10-CM | POA: Diagnosis not present

## 2024-08-01 DIAGNOSIS — J432 Centrilobular emphysema: Secondary | ICD-10-CM | POA: Insufficient documentation

## 2024-08-08 ENCOUNTER — Other Ambulatory Visit: Payer: Self-pay

## 2024-08-08 ENCOUNTER — Encounter (HOSPITAL_COMMUNITY): Payer: Self-pay | Admitting: Emergency Medicine

## 2024-08-08 ENCOUNTER — Ambulatory Visit: Payer: Self-pay | Admitting: Emergency Medicine

## 2024-08-08 NOTE — Anesthesia Preprocedure Evaluation (Addendum)
 Anesthesia Evaluation  Patient identified by MRN, date of birth, ID band Patient awake    Reviewed: Allergy & Precautions, NPO status , Patient's Chart, lab work & pertinent test results  History of Anesthesia Complications Negative for: history of anesthetic complications  Airway Mallampati: II  TM Distance: >3 FB Neck ROM: Full    Dental  (+) Teeth Intact, Dental Advisory Given   Pulmonary COPD, Current Smoker   breath sounds clear to auscultation       Cardiovascular pulmonary hypertension+CHF   Rhythm:Regular Rate:Normal  1. Left ventricular ejection fraction, by estimation, is 70 to 75%. The  left ventricle has hyperdynamic function. The left ventricle has no  regional wall motion abnormalities. There is moderate left ventricular  hypertrophy.   2. Right ventricular systolic function is moderately reduced. The right  ventricular size is mildly enlarged. Moderately increased right  ventricular wall thickness. There is mildly elevated pulmonary artery  systolic pressure.   3. The mitral valve is normal in structure. No evidence of mitral valve  regurgitation.   4. The aortic valve is normal in structure. Aortic valve regurgitation is  not visualized. No aortic stenosis is present.     Neuro/Psych    GI/Hepatic   Endo/Other    Renal/GU      Musculoskeletal   Abdominal   Peds  Hematology   Anesthesia Other Findings   Reproductive/Obstetrics                              Anesthesia Physical Anesthesia Plan  ASA: 3  Anesthesia Plan: General   Post-op Pain Management:    Induction: Intravenous  PONV Risk Score and Plan: 1 and Ondansetron, Treatment may vary due to age or medical condition and Propofol  infusion  Airway Management Planned: Oral ETT  Additional Equipment:   Intra-op Plan:   Post-operative Plan: Extubation in OR  Informed Consent:      Dental advisory  given  Plan Discussed with: CRNA and Surgeon  Anesthesia Plan Comments: (See PAT note from 10/24 )         Anesthesia Quick Evaluation

## 2024-08-08 NOTE — Telephone Encounter (Signed)
 FYI Only or Action Required?: Action required by provider: clinical question for provider.  Patient is followed in Pulmonology for lung nodules, last seen on 07/22/2024 by Valerye Cools, MD.  Called Nurse Triage reporting Advice Only.  Triage Disposition: Call PCP When Office is Open  Patient/caregiver understands and will follow disposition?: Yes     Copied from CRM [id number]. Topic: Clinical - Medical Advice >> Aug 08, 2024 11:10 AM [name] wrote: Reason for CRM: susan w/ [facility] pre opt states pt os avin broch on Monday.  They are lookimg aspirin  instruction       Reason for Disposition  [1] Caller requesting NON-URGENT health information AND [2] PCP's office is the best resource  Answer Assessment - Initial Assessment Questions 1. REASON FOR CALL or QUESTION: What is your reason for calling today? or How can I best     Patient has a bronchoscopy scheduled for 10/27 and wanted to know if there are any specific instructions for the patient for her aspirin , if it needs to be held or any other instructions.  2. CALLER: Document the source of call. (e.g., laboratory staff, caregiver or patient).     Devere with [facility] Pre-Op   [phone]  Protocols used: Information Only Call - No Triage-A-AH

## 2024-08-08 NOTE — Progress Notes (Signed)
 PCP - [facility] Internal Medicine Cardiologist - Candyce Reek, MD  Chest x-ray - DOS EKG - 10/24/23 ECHO - 12/05/21  Anesthesia review: Y  Patient verbally denies any shortness of breath, fever, cough and chest pain during phone call   -------------  SDW INSTRUCTIONS given:  Your procedure is scheduled on Monday, Oct 27th.  Report to [facility] Main Entrance A at 1015 A.M., and check in at the Admitting office.  Call this number if you have problems the morning of surgery:  [phone]   Remember:  Do not eat or drink after midnight the night before your surgery    Take these medicines the morning of surgery with A SIP OF WATER   acetaminophen  (TYLENOL )-if needed  As of today, STOP taking any Aspirin  (unless otherwise instructed by your surgeon) Aleve, Naproxen, Ibuprofen, Motrin, Advil, Goody's, BC's, all herbal medications, fish oil, and all vitamins.                      Do not wear jewelry, make up, or nail polish            Do not wear lotions, powders, perfumes/colognes, or deodorant.            Do not shave 48 hours prior to surgery.  Men may shave face and neck.            Do not bring valuables to the hospital.            [facility] is not responsible for any belongings or valuables.  Do NOT Smoke (Tobacco/Vaping) 24 hours prior to your procedure If you use a CPAP at night, you may bring all equipment for your overnight stay.   Contacts, glasses, dentures or bridgework may not be worn into surgery.      For patients admitted to the hospital, discharge time will be determined by your treatment team.   Patients discharged the day of surgery will not be allowed to drive home, and someone needs to stay with them for 24 hours.    Special instructions:   [facility]- Preparing For Surgery  Before surgery, you can play an important role. Because skin is not sterile, your skin needs to be as free of germs as possible. You can reduce the number of germs on  your skin by washing with CHG (chlorahexidine gluconate) Soap before surgery.  CHG is an antiseptic cleaner which kills germs and bonds with the skin to continue killing germs even after washing.    Oral Hygiene is also important to reduce your risk of infection.  Remember - BRUSH YOUR TEETH THE MORNING OF SURGERY WITH YOUR REGULAR TOOTHPASTE  Please do not use if you have an allergy to CHG or antibacterial soaps. If your skin becomes reddened/irritated stop using the CHG.  Do not shave (including legs and underarms) for at least 48 hours prior to first CHG shower. It is OK to shave your face.  Please follow these instructions carefully.   Shower the NIGHT BEFORE SURGERY and the MORNING OF SURGERY with DIAL Soap.   Pat yourself dry with a CLEAN TOWEL.  Wear CLEAN PAJAMAS to bed the night before surgery  Place CLEAN SHEETS on your bed the night of your first shower and DO NOT SLEEP WITH PETS.   Day of Surgery: Please shower morning of surgery  Wear Clean/Comfortable clothing the morning of surgery Do not apply any deodorants/lotions.   Remember to brush your teeth WITH YOUR REGULAR  TOOTHPASTE.   Questions were answered. Patient verbalized understanding of instructions.

## 2024-08-08 NOTE — Progress Notes (Signed)
 Case: [id number] Date/Time: 08/11/24 1245   Procedure: VIDEO BRONCHOSCOPY WITH ENDOBRONCHIAL NAVIGATION (Right)   Anesthesia type: General   Diagnosis: Multiple lung nodules [R91.8]   Pre-op diagnosis: r apex nodule   [facility] / [facility]   Surgeons: Shelah Lamar RAMAN, MD       DISCUSSION: Gina Cherry is a 69 yo female with PMH of current smoking, COPD, hx of ARDS, hx of takotsubo cardiomyopathy, history of EtOH abuse  Patient with admission from 11/28/2021 - 12/18/2021 for acute respiratory failure in the setting of Legionella pneumonia and COPD as well as acute systolic heart failure due to stress cardiomyopathy.  Initial echo obtained showed reduced EF to 30 to 35%.  Repeat echo 6 days later showed normal LVEF.  Patient seen by cardiology and follow-up in clinic, last seen on 07/02/2023.  Noted to be stable at that visit.  She is only on metoprolol .  Advised to continue current medicines and return in 1 year.  Patient with history of COPD and lung nodules.  Follows with pulmonology and was last seen on 07/22/2024.  Breathing noted to be stable.  PFTs ordered but not done yet.  Patient is a current smoker.  Now scheduled for surgery above   VS: Ht 5' 4 (1.626 m)   Wt 40.4 kg   BMI 15.28 kg/m   PROVIDERS: Tobie Gaines, DO   LABS: Labs reviewed: Acceptable for surgery. (all labs ordered are listed, but only abnormal results are displayed)  Labs Reviewed - No data to display   CT chest 08/01/2024:  IMPRESSION: 1. Unchanged subpleural nodule of the medial right apex measuring 1.4 x 1.2 cm, previously FDG avid highly suspicious for primary lung malignancy. 2. Significant interval improvement in dense, heterogeneous consolidation of the infrahilar right lung, now with predominantly bandlike scarring of the right lung base with and associated coarsely calcified nodularity. Findings are consistent with resolving infection or aspiration an underlying  chronic sequelae. 3. Unchanged cystic lesion in the right upper lobe measuring 1.8 x 1.7 cm with some associated peripheral nodularity. This is moderately suspicious for cystic adenocarcinoma and warrants continued attention on follow-up. 4. Emphysema. 5. Coronary artery disease. 6. Hepatic steatosis.   Aortic Atherosclerosis (ICD10-I70.0) and Emphysema (ICD10-J43.9).  EKG:   Echo 12/05/2021:  IMPRESSIONS    1. Left ventricular ejection fraction, by estimation, is 70 to 75%. The left ventricle has hyperdynamic function. The left ventricle has no regional wall motion abnormalities. There is moderate left ventricular hypertrophy.  2. Right ventricular systolic function is moderately reduced. The right ventricular size is mildly enlarged. Moderately increased right ventricular wall thickness. There is mildly elevated pulmonary artery systolic pressure.  3. The mitral valve is normal in structure. No evidence of mitral valve regurgitation.  4. The aortic valve is normal in structure. Aortic valve regurgitation is not visualized. No aortic stenosis is present. Past Medical History:  Diagnosis Date   COPD (chronic obstructive pulmonary disease) (HCC)    Emphysema lung (HCC)    H/O Legionnaire's disease    2023   History of adult respiratory distress syndrome (ARDS)    2023   Osteoporosis    Pneumonia    Takotsubo cardiomyopathy    2023   Underweight     History reviewed. No pertinent surgical history.  MEDICATIONS: No current facility-administered medications for this encounter.    acetaminophen  (TYLENOL ) 325 MG tablet   acetaminophen  (TYLENOL ) 500 MG tablet   alendronate (FOSAMAX) 70 MG tablet  alendronate (FOSAMAX) 70 MG tablet   aspirin  81 MG chewable tablet   metoprolol  tartrate (LOPRESSOR ) 25 MG tablet   metoprolol  tartrate (LOPRESSOR ) 25 MG tablet   Multiple Vitamin (MULTIVITAMIN WITH MINERALS) TABS tablet   Multiple Vitamin (MULTIVITAMIN WITH MINERALS) TABS  tablet   Pramoxine-HC (HYDROCORTISONE ACE-PRAMOXINE) 2.5-1 % CREA   PREVIDENT 5000 BOOSTER PLUS 1.1 % PSTE   Burnard CHRISTELLA Senna, PA-C [facility] Surgical Short Stay/Anesthesiology [facility] Phone [phone] 08/08/2024 3:47 PM

## 2024-08-08 NOTE — Telephone Encounter (Addendum)
 I tried calling Gina Cherry back at [facility] to verify Aspirin  directions. There was no answer- I left a detailed vm letting her know this.  Aspirin  is to be held 2 days prior to bronch procedure on 10/27.   Called and spoke with the pt to verify this, she states she does not take aspirin  anymore.  Nfn

## 2024-08-11 ENCOUNTER — Ambulatory Visit (HOSPITAL_COMMUNITY): Payer: Self-pay | Admitting: Medical

## 2024-08-11 ENCOUNTER — Encounter (HOSPITAL_COMMUNITY)
Admission: RE | Disposition: A | Discharge status: Home / Self Care | Payer: Self-pay | Source: Home / Self Care | Attending: Emergency Medicine

## 2024-08-11 ENCOUNTER — Ambulatory Visit (HOSPITAL_COMMUNITY)
Admission: RE | Admit: 2024-08-11 | Discharge: 2024-08-11 | Disposition: A | Discharge status: Home / Self Care | Attending: Emergency Medicine | Admitting: Emergency Medicine

## 2024-08-11 ENCOUNTER — Ambulatory Visit (HOSPITAL_COMMUNITY)

## 2024-08-11 ENCOUNTER — Encounter (HOSPITAL_COMMUNITY): Payer: Self-pay | Admitting: Emergency Medicine

## 2024-08-11 ENCOUNTER — Ambulatory Visit: Payer: Self-pay | Admitting: Pulmonary Disease

## 2024-08-11 ENCOUNTER — Other Ambulatory Visit: Payer: Self-pay

## 2024-08-11 DIAGNOSIS — I272 Pulmonary hypertension, unspecified: Secondary | ICD-10-CM | POA: Insufficient documentation

## 2024-08-11 DIAGNOSIS — J449 Chronic obstructive pulmonary disease, unspecified: Secondary | ICD-10-CM | POA: Insufficient documentation

## 2024-08-11 DIAGNOSIS — I509 Heart failure, unspecified: Secondary | ICD-10-CM | POA: Insufficient documentation

## 2024-08-11 DIAGNOSIS — F17200 Nicotine dependence, unspecified, uncomplicated: Secondary | ICD-10-CM | POA: Diagnosis not present

## 2024-08-11 DIAGNOSIS — F17210 Nicotine dependence, cigarettes, uncomplicated: Secondary | ICD-10-CM

## 2024-08-11 DIAGNOSIS — Z48813 Encounter for surgical aftercare following surgery on the respiratory system: Secondary | ICD-10-CM | POA: Diagnosis not present

## 2024-08-11 DIAGNOSIS — R911 Solitary pulmonary nodule: Secondary | ICD-10-CM

## 2024-08-11 DIAGNOSIS — R918 Other nonspecific abnormal finding of lung field: Secondary | ICD-10-CM | POA: Diagnosis present

## 2024-08-11 DIAGNOSIS — C3411 Malignant neoplasm of upper lobe, right bronchus or lung: Secondary | ICD-10-CM | POA: Diagnosis not present

## 2024-08-11 DIAGNOSIS — I110 Hypertensive heart disease with heart failure: Secondary | ICD-10-CM

## 2024-08-11 DIAGNOSIS — I700 Atherosclerosis of aorta: Secondary | ICD-10-CM | POA: Diagnosis not present

## 2024-08-11 HISTORY — DX: Pneumonia, unspecified organism: J18.9

## 2024-08-11 HISTORY — PX: VIDEO BRONCHOSCOPY WITH ENDOBRONCHIAL NAVIGATION: SHX6175

## 2024-08-11 HISTORY — DX: Takotsubo syndrome: I51.81

## 2024-08-11 HISTORY — DX: Personal history of other infectious and parasitic diseases: Z86.19

## 2024-08-11 HISTORY — DX: Personal history of other diseases of the respiratory system: Z87.09

## 2024-08-11 HISTORY — DX: Emphysema, unspecified: J43.9

## 2024-08-11 LAB — CBC
HCT: 44.4 % (ref 36.0–46.0)
Hemoglobin: 15.9 g/dL — ABNORMAL HIGH (ref 12.0–15.0)
MCH: 37.4 pg — ABNORMAL HIGH (ref 26.0–34.0)
MCHC: 35.8 g/dL (ref 30.0–36.0)
MCV: 104.5 fL — ABNORMAL HIGH (ref 80.0–100.0)
Platelets: 287 K/uL (ref 150–400)
RBC: 4.25 MIL/uL (ref 3.87–5.11)
RDW: 13.7 % (ref 11.5–15.5)
WBC: 10.1 K/uL (ref 4.0–10.5)
nRBC: 0 % (ref 0.0–0.2)

## 2024-08-11 LAB — BASIC METABOLIC PANEL WITH GFR
Anion gap: 18 — ABNORMAL HIGH (ref 5–15)
BUN: <5 mg/dL — ABNORMAL LOW (ref 8–23)
CO2: 25 mmol/L (ref 22–32)
Calcium: 8.9 mg/dL (ref 8.9–10.3)
Chloride: 99 mmol/L (ref 98–111)
Creatinine, Ser: 0.53 mg/dL (ref 0.44–1.00)
GFR, Estimated: >60 mL/min (ref >60)
Glucose, Bld: 120 mg/dL — ABNORMAL HIGH (ref 70–99)
Potassium: 3 mmol/L — ABNORMAL LOW (ref 3.5–5.1)
Sodium: 142 mmol/L (ref 135–145)

## 2024-08-11 SURGERY — VIDEO BRONCHOSCOPY WITH ENDOBRONCHIAL NAVIGATION
Anesthesia: General | Laterality: Right

## 2024-08-11 MED ORDER — LACTATED RINGERS IV SOLN: INTRAVENOUS | Status: DC

## 2024-08-11 MED ORDER — OXYCODONE HCL 5 MG/5ML PO SOLN: 5.0000 mg | Freq: Once | ORAL | Status: DC | PRN

## 2024-08-11 MED ORDER — LIDOCAINE 2% (20 MG/ML) 5 ML SYRINGE
INTRAMUSCULAR | Status: DC | PRN
  Administered 2024-08-11: 60 mg via INTRAVENOUS

## 2024-08-11 MED ORDER — FENTANYL CITRATE (PF) 100 MCG/2ML IJ SOLN: 25.0000 ug | INTRAMUSCULAR | Status: DC | PRN

## 2024-08-11 MED ORDER — DROPERIDOL 2.5 MG/ML IJ SOLN: 0.6250 mg | Freq: Once | INTRAMUSCULAR | Status: DC | PRN

## 2024-08-11 MED ORDER — ROCURONIUM BROMIDE 10 MG/ML (PF) SYRINGE
PREFILLED_SYRINGE | INTRAVENOUS | Status: DC | PRN
  Administered 2024-08-11 (×3): 10 mg via INTRAVENOUS
  Administered 2024-08-11: 30 mg via INTRAVENOUS
  Administered 2024-08-11: 10 mg via INTRAVENOUS

## 2024-08-11 MED ORDER — SUGAMMADEX SODIUM 200 MG/2ML IV SOLN
INTRAVENOUS | Status: DC | PRN
  Administered 2024-08-11: 200 mg via INTRAVENOUS

## 2024-08-11 MED ORDER — DEXAMETHASONE SOD PHOSPHATE PF 10 MG/ML IJ SOLN
INTRAMUSCULAR | Status: DC | PRN
  Administered 2024-08-11: 10 mg via INTRAVENOUS

## 2024-08-11 MED ORDER — ONDANSETRON HCL 4 MG/2ML IJ SOLN
INTRAMUSCULAR | Status: DC | PRN
  Administered 2024-08-11: 4 mg via INTRAVENOUS

## 2024-08-11 MED ORDER — OXYCODONE HCL 5 MG PO TABS: 5.0000 mg | ORAL_TABLET | Freq: Once | ORAL | Status: DC | PRN

## 2024-08-11 MED ORDER — ONDANSETRON HCL 4 MG/2ML IJ SOLN: 4.0000 mg | Freq: Once | INTRAMUSCULAR | Status: DC | PRN

## 2024-08-11 MED ORDER — CHLORHEXIDINE GLUCONATE 0.12 % MT SOLN
15.0000 mL | Freq: Once | OROMUCOSAL | Status: AC
  Administered 2024-08-11: 15 mL via OROMUCOSAL
  Filled 2024-08-11: qty 15

## 2024-08-11 MED ORDER — PROPOFOL 10 MG/ML IV BOLUS
INTRAVENOUS | Status: DC | PRN
  Administered 2024-08-11: 20 mg via INTRAVENOUS
  Administered 2024-08-11: 40 mg via INTRAVENOUS
  Administered 2024-08-11: 100 ug/kg/min via INTRAVENOUS
  Administered 2024-08-11: 20 mg via INTRAVENOUS

## 2024-08-11 MED ORDER — PHENYLEPHRINE 80 MCG/ML (10ML) SYRINGE FOR IV PUSH (FOR BLOOD PRESSURE SUPPORT)
PREFILLED_SYRINGE | INTRAVENOUS | Status: DC | PRN
  Administered 2024-08-11 (×3): 80 ug via INTRAVENOUS
  Administered 2024-08-11 (×2): 160 ug via INTRAVENOUS

## 2024-08-11 SURGICAL SUPPLY — 37 items
ADAPTER BRONCHOSCOPE OLYMPUS (ADAPTER) ×1 IMPLANT
ADAPTER VALVE BIOPSY EBUS (MISCELLANEOUS) IMPLANT
BAG COUNTER SPONGE SURGICOUNT (BAG) ×1 IMPLANT
BRUSH CYTOL CELLEBRITY 1.5X140 (MISCELLANEOUS) ×1 IMPLANT
BRUSH SUPERTRAX BIOPSY (INSTRUMENTS) IMPLANT
BRUSH SUPERTRAX NDL-TIP CYTO (INSTRUMENTS) ×1 IMPLANT
CANISTER SUCTION 3000ML PPV (SUCTIONS) ×1 IMPLANT
CNTNR URN SCR LID CUP LEK RST (MISCELLANEOUS) ×1 IMPLANT
COVER BACK TABLE 60X90IN (DRAPES) ×1 IMPLANT
FILTER STRAW FLUID ASPIR (MISCELLANEOUS) IMPLANT
FORCEPS BIOP 1.5 SINGLE USE (MISCELLANEOUS) ×1 IMPLANT
FORCEPS BIOP SUPERTRX PREMAR (INSTRUMENTS) ×1 IMPLANT
GAUZE SPONGE 4X4 12PLY STRL (GAUZE/BANDAGES/DRESSINGS) ×1 IMPLANT
GLOVE BIO SURGEON STRL SZ7.5 (GLOVE) ×2 IMPLANT
GOWN STRL REUS W/ TWL LRG LVL3 (GOWN DISPOSABLE) ×2 IMPLANT
KIT CLEAN ENDO COMPLIANCE (KITS) ×1 IMPLANT
KIT LOCATABLE GUIDE (CANNULA) IMPLANT
KIT MARKER FIDUCIAL DELIVERY (KITS) IMPLANT
KIT TURNOVER KIT B (KITS) ×1 IMPLANT
MARKER SKIN DUAL TIP RULER LAB (MISCELLANEOUS) ×1 IMPLANT
NDL SUPERTRX PREMARK BIOPSY (NEEDLE) ×1 IMPLANT
NEEDLE SUPERTRX PREMARK BIOPSY (NEEDLE) ×1 IMPLANT
OIL SILICONE PENTAX (PARTS (SERVICE/REPAIRS)) ×1 IMPLANT
PAD ARMBOARD POSITIONER FOAM (MISCELLANEOUS) ×2 IMPLANT
PATCHES PATIENT (LABEL) ×3 IMPLANT
SOLN 0.9% NACL POUR BTL 1000ML (IV SOLUTION) ×1 IMPLANT
SOLN STERILE WATER BTL 1000 ML (IV SOLUTION) ×1 IMPLANT
SYR 20ML ECCENTRIC (SYRINGE) ×1 IMPLANT
SYR 20ML LL LF (SYRINGE) ×1 IMPLANT
SYR 50ML SLIP (SYRINGE) ×1 IMPLANT
SuperLock Fiducial Marker IMPLANT
TOWEL GREEN STERILE FF (TOWEL DISPOSABLE) ×1 IMPLANT
TRAP SPECIMEN MUCUS 40CC (MISCELLANEOUS) IMPLANT
TUBE CONNECTING 20X1/4 (TUBING) ×1 IMPLANT
UNDERPAD 30X36 HEAVY ABSORB (UNDERPADS AND DIAPERS) ×1 IMPLANT
VALVE BIOPSY SINGLE USE (MISCELLANEOUS) ×1 IMPLANT
VALVE SUCTION BRONCHIO DISP (MISCELLANEOUS) ×1 IMPLANT

## 2024-08-11 NOTE — Anesthesia Procedure Notes (Signed)
 Procedure Name: Intubation Date/Time: 08/11/2024 12:30 PM  Performed by: Ashtynn Berke A, CRNAPre-anesthesia Checklist: Patient identified, Emergency Drugs available, Suction available and Patient being monitored Patient Re-evaluated:Patient Re-evaluated prior to induction Oxygen Delivery Method: Circle System Utilized Preoxygenation: Pre-oxygenation with 100% oxygen Induction Type: IV induction Ventilation: Mask ventilation without difficulty Laryngoscope Size: Mac and 3 Grade View: Grade I Tube type: Oral Tube size: 8.0 mm Number of attempts: 1 Airway Equipment and Method: Stylet and Oral airway Placement Confirmation: ETT inserted through vocal cords under direct vision, positive ETCO2 and breath sounds checked- equal and bilateral Secured at: 22 cm Tube secured with: Tape Dental Injury: Teeth and Oropharynx as per pre-operative assessment

## 2024-08-11 NOTE — Op Note (Signed)
 Procedure Note  Patient: Gina Cherry  [employer] Cios mobile C-arm was utilized to identify and biopsy right upper lobe and right lower lobe nodules.  Needle-in-lesion was confirmed using real-time Cios imaging, and images were uploaded to PACS.    Right upper lobe (apical/medial) nodule.     Lamar Chris, MD, PhD 08/11/2024, 2:33 PM [facility] [phone] or if no answer before 7:00PM call [phone] For any issues after 7:00PM please call eLink [phone]

## 2024-08-11 NOTE — Anesthesia Postprocedure Evaluation (Signed)
 Anesthesia Post Note  Patient: Gina Cherry  Procedure(s) Performed: VIDEO BRONCHOSCOPY WITH ENDOBRONCHIAL NAVIGATION (Right)     Patient location during evaluation: PACU Anesthesia Type: General Level of consciousness: awake and alert Pain management: pain level controlled Vital Signs Assessment: post-procedure vital signs reviewed and stable Respiratory status: spontaneous breathing Cardiovascular status: blood pressure returned to baseline Postop Assessment: no apparent nausea or vomiting Anesthetic complications: no   No notable events documented.  Last Vitals:  Vitals:   08/11/24 1430 08/11/24 1445  BP: 107/67 101/70  Pulse: 90 92  Resp: 16 16  Temp:  36.8 C  SpO2: 93% 94%    Last Pain:  Vitals:   08/11/24 1445  TempSrc:   PainSc: 0-No pain                 Lauraine KATHEE [place]

## 2024-08-11 NOTE — Discharge Instructions (Addendum)

## 2024-08-11 NOTE — Interval H&P Note (Signed)
 History and Physical Interval Note:  08/11/2024 12:11 PM  Gina Cherry  has presented today for surgery, with the diagnosis of r apex nodule.  The various methods of treatment have been discussed with the patient and family. After consideration of risks, benefits and other options for treatment, the patient has consented to  Procedure(s): VIDEO BRONCHOSCOPY WITH ENDOBRONCHIAL NAVIGATION (Right) as a surgical intervention.  The patient's history has been reviewed, patient examined, no change in status, stable for surgery.  I have reviewed the patient's chart and labs.  Questions were answered to the patient's satisfaction.     Lamar GORMAN Chris

## 2024-08-11 NOTE — Op Note (Signed)
 Video Bronchoscopy with Robotic Assisted Bronchoscopic Navigation   Date of Operation: 08/11/2024   Pre-op Diagnosis: Right upper lobe and right lower lobe pulmonary nodules  Post-op Diagnosis: Same   Surgeon: Lamar Chris  Assistants: None  Anesthesia: General endotracheal anesthesia  Operation: Flexible video fiberoptic bronchoscopy with robotic assistance and biopsies.  Estimated Blood Loss: Minimal  Complications: None  Indications and History: Gina Cherry is a 68 y.o. female with history of tobacco use and history of severe Legionella pneumonia 2023 complicated by ARDS.  Surveillance imaging has revealed hypermetabolic apical right upper lobe nodule, hypermetabolic cavitary right upper lobe nodule, non-hypermetabolic rounded right lower lobe nodule.  Recommendation made to achieve a tissue diagnosis via robotic assisted navigational bronchoscopy.  The risks, benefits, complications, treatment options and expected outcomes were discussed with the patient.  The possibilities of pneumothorax, pneumonia, reaction to medication, pulmonary aspiration, perforation of a viscus, bleeding, failure to diagnose a condition and creating a complication requiring transfusion or operation were discussed with the patient who freely signed the consent.    Description of Procedure: The patient was seen in the Preoperative Area, was examined and was deemed appropriate to proceed.  The patient was taken to [facility] Endoscopy room 3, identified as Gina Cherry and the procedure verified as Flexible Video Fiberoptic Bronchoscopy.  A Time Out was held and the above information confirmed.   Prior to the date of the procedure a high-resolution CT scan of the chest was performed. Utilizing ION software program a virtual tracheobronchial tree was generated to allow the creation of distinct navigation pathways to the patient's parenchymal abnormalities. After being taken to the operating room general  anesthesia was initiated and the patient  was orally intubated. The video fiberoptic bronchoscope was introduced via the endotracheal tube and a general inspection was performed which showed normal right and left lung anatomy. Aspiration of the bilateral mainstems was completed to remove any remaining secretions. Robotic catheter inserted into patient's endotracheal tube.   Target #1 apical/medial right upper lobe nodule: The distinct navigation pathways prepared prior to this procedure were then utilized to navigate to patient's lesion identified on CT scan. The robotic catheter was secured into place and the vision probe was withdrawn.  Lesion location was approximated using fluoroscopy.  Local registration and targeting was performed using [employer] Cios mobile C-arm three-dimensional imaging. Under fluoroscopic guidance transbronchial brushings, transbronchial needle biopsies, and transbronchial forceps biopsies were performed to be sent for cytology and pathology.  Needle-in-lesion was confirmed using Cios mobile C-arm.    Target #2 right upper lobe cavitary nodule (more inferior): The distinct navigation pathways prepared prior to this procedure were then utilized to navigate to patient's lesion identified on CT scan. The robotic catheter was secured into place and the vision probe was withdrawn.  Lesion location was approximated using fluoroscopy.  Local registration and targeting was performed using [employer] Cios mobile C-arm three-dimensional imaging. Under fluoroscopic guidance transbronchial brushings, transbronchial needle biopsies, and transbronchial forceps biopsies were performed to be sent for cytology and pathology.   Under fluoroscopic guidance a single fiducial marker was placed adjacent to the nodule.  Target #3 right lower lobe nodule: The distinct navigation pathways prepared prior to this procedure were then utilized to navigate to patient's lesion identified on  CT scan. The robotic catheter was secured into place and the vision probe was withdrawn.  Lesion location was approximated using fluoroscopy.  Local registration and targeting was performed using [employer] Cios mobile C-arm three-dimensional  imaging. Under fluoroscopic guidance transbronchial needle biopsies and transbronchial forceps biopsies were performed to be sent for cytology and pathology.  Needle-in-lesion was confirmed using Cios mobile C-arm.     At the end of the procedure a general airway inspection was performed and there was no evidence of active bleeding. The bronchoscope was removed.  The patient tolerated the procedure well. There was no significant blood loss and there were no obvious complications. A post-procedural chest x-ray is pending.  Samples Target #1: 1. Transbronchial brushings from right upper lobe apical/medial nodule 2. Transbronchial Wang needle biopsies from right upper lobe nodule 3. Transbronchial forceps biopsies from right upper lobe nodule   Samples Target #2: 1. Transbronchial brushings from right upper lobe cavitary nodule 2. Transbronchial Wang needle biopsies from right upper lobe nodule 3. Transbronchial forceps biopsies from right upper lobe nodule   Samples Target #3: 1. Transbronchial Wang needle biopsies from right lower lobe nodule 2. Transbronchial forceps biopsies from right lower lobe nodule    Plans:  The patient will be discharged from the PACU to home when recovered from anesthesia and after chest x-ray is reviewed. We will review the cytology, pathology and microbiology results with the patient when they become available. Outpatient followup will be with Dr Jaelie.    Lamar Chris, MD, PhD 08/11/2024, 2:07 PM [facility] [phone] or if no answer before 7:00PM call [phone] For any issues after 7:00PM please call eLink [phone]

## 2024-08-11 NOTE — Transfer of Care (Signed)
 Immediate Anesthesia Transfer of Care Note  Patient: Gina Cherry  Procedure(s) Performed: VIDEO BRONCHOSCOPY WITH ENDOBRONCHIAL NAVIGATION (Right)  Patient Location: PACU  Anesthesia Type:General  Level of Consciousness: awake, alert , and oriented  Airway & Oxygen Therapy: Patient Spontanous Breathing and Patient connected to nasal cannula oxygen  Post-op Assessment: Report given to RN and Post -op Vital signs reviewed and stable  Post vital signs: Reviewed and stable  Last Vitals:  Vitals Value Taken Time  BP 94/54 08/11/24 14:10  Temp 37.6 C 08/11/24 14:10  Pulse 97 08/11/24 14:11  Resp 15 08/11/24 14:11  SpO2 93 % 08/11/24 14:11  Vitals shown include unfiled device data.  Last Pain:  Vitals:   08/11/24 1101  TempSrc:   PainSc: 0-No pain         Complications: No notable events documented.

## 2024-08-12 ENCOUNTER — Encounter (HOSPITAL_COMMUNITY): Payer: Self-pay | Admitting: Emergency Medicine

## 2024-08-12 LAB — CYTOLOGY - NON PAP

## 2024-08-13 LAB — CYTOLOGY - NON PAP

## 2024-08-15 ENCOUNTER — Ambulatory Visit: Payer: Self-pay | Admitting: Student

## 2024-08-18 NOTE — Progress Notes (Signed)
 "  Established Patient Pulmonology Office Visit   Subjective:  Patient ID: Gina Cherry, female    DOB: [birth year]  MRN: [id number]  CC: F/U bronch results.   HPI  Gina Cherry is a 68 y.o. female with nicotine  dependence with current use (20 PY), emphysema, and multiple pulmonary nodules who presents for follow up.  Last seen on 07/22/24, recommended undergoing super D CT and navigational bronchoscopy. The latter was performed on 08/11/2024. Pathology result c/w squamous cell carcinoma  Discussed the use of AI scribe software for clinical note transcription with the patient, who gave verbal consent to proceed. History of Present Illness Gina Cherry is a 69 year old female with COPD and emphysema who presents with a new diagnosis of lung cancer.  She was diagnosed with lung cancer following a biopsy of two spots in the right upper lobe of her lung, both confirmed to be the same type of cancer. A third spot in the right lower lobe was biopsied and found not to be cancerous. During a previous hospitalization for pneumonia, the current lung spots were not present, and only the right lower lobe nodule was noted.  She underwent a pulmonary function test showing an FEV1 of 1.9 liters (83% of predicted), FVC of 2.8 liters (90% of predicted), and an FEV1/FVC ratio of 69%, indicating COPD. Total lung capacity was 106%, and residual volume was 124%, suggesting air trapping. The DLCO was 56%, below the normal range.  She has a history of COPD and emphysema, as noted on a previous CT scan. No current shortness of breath and does not use inhalers regularly, although albuterol  is available as needed. She was intubated for three weeks approximately three years ago due to Legionnaires' disease, after which she was discharged with inhalers that she did not use.  She continues to smoke and has not attempted to quit recently. Nicotine  patches were provided during a previous hospital stay.  She acknowledges the difficulty of quitting smoking but has not used patches or lozenges since then.  ROS    Current Outpatient Medications:    acetaminophen  (TYLENOL ) 325 MG tablet, Take 2 tablets (650 mg total) by mouth every 6 (six) hours as needed for mild pain (pain score 1-3) (or Fever >/= 101)., Disp: 30 tablet, Rfl: 0   alendronate (FOSAMAX) 70 MG tablet, Take 70 mg by mouth once a week. Saturday 10/20/23 last dose., Disp: , Rfl:    Multiple Vitamin (MULTIVITAMIN WITH MINERALS) TABS tablet, Take 1 tablet by mouth daily., Disp: 30 tablet, Rfl: 0   PREVIDENT 5000 BOOSTER PLUS 1.1 % PSTE, Place onto teeth as directed., Disp: , Rfl:    acetaminophen  (TYLENOL ) 500 MG tablet, Take 500 mg by mouth every 6 (six) hours as needed for headache (pain). (Patient not taking: Reported on 08/19/2024), Disp: , Rfl:    alendronate (FOSAMAX) 70 MG tablet, TAKE 1 TABLET BY MOUTH 1 TIME A WEEK EARLY IN THE MORNING. DO NOT LIE DOWN OR HAVE ANYTHING BUT WATER  TO DRINK FOR 30 MINUTES (Patient not taking: Reported on 08/19/2024), Disp: , Rfl:    Multiple Vitamin (MULTIVITAMIN WITH MINERALS) TABS tablet, Take 1 tablet by mouth at bedtime. (Patient not taking: Reported on 08/19/2024), Disp: , Rfl:    Pramoxine-HC (HYDROCORTISONE ACE-PRAMOXINE) 2.5-1 % CREA, Apply 1 application topically 2 (two) times daily as needed (hemorrhoids). (Patient not taking: Reported on 08/19/2024), Disp: , Rfl:       Objective:  BP 132/87   Pulse 78  Ht 5' 4 (1.626 m)   Wt 91 lb 3.2 oz (41.4 kg)   SpO2 97%   BMI 15.65 kg/m  Wt Readings from Last 3 Encounters:  08/19/24 91 lb 3.2 oz (41.4 kg)  08/11/24 89 lb (40.4 kg)  07/22/24 89 lb (40.4 kg)   BMI Readings from Last 3 Encounters:  08/19/24 15.65 kg/m  08/11/24 15.28 kg/m  07/22/24 15.28 kg/m   SpO2 Readings from Last 3 Encounters:  08/19/24 97%  08/11/24 94%  07/22/24 90%   Physical Exam General: NAD, alert, WD, WN Eyes: PERRL, no scleral icterus ENMT: oropharynx  clear, good dentition, no oral lesions, mallampati score I  Skin: warm, intact, no rashes Neck: JVD flat, ROM and lymph node assessment normal CV: RRR, no MRG, nl S1 and S2, no peripheral edema Resp: clear to auscultation bilaterally, no wheezes, rales, or rhonchi, normal effort, no clubbing/cyanosis Neuro: Awake alert oriented to person place time and situation  Diagnostic Review:  Last CBC Lab Results  Component Value Date   WBC 10.1 08/11/2024   HGB 15.9 (H) 08/11/2024   HCT 44.4 08/11/2024   MCV 104.5 (H) 08/11/2024   MCH 37.4 (H) 08/11/2024   RDW 13.7 08/11/2024   PLT 287 08/11/2024   Last metabolic panel Lab Results  Component Value Date   GLUCOSE 120 (H) 08/11/2024   NA 142 08/11/2024   K 3.0 (L) 08/11/2024   CL 99 08/11/2024   CO2 25 08/11/2024   BUN <5 (L) 08/11/2024   CREATININE 0.53 08/11/2024   GFRNONAA >60 08/11/2024   CALCIUM  8.9 08/11/2024   PHOS 4.3 12/13/2021   PROT 4.3 (L) 10/24/2023   ALBUMIN 2.5 (L) 10/24/2023   BILITOT 0.7 10/24/2023   ALKPHOS 38 10/24/2023   AST 75 (H) 10/24/2023   ALT 36 10/24/2023   ANIONGAP 18 (H) 08/11/2024    Pathology 08/11/2024: FINAL MICROSCOPIC DIAGNOSIS:  A. LUNG, RUL, FINE NEEDLE ASPIRATION  BIOPSY:  Rare malignant cells consistent with non-small cell carcinoma   B. LUNG, RUL, BRUSHING:  No malignant cells identified  Blood   C. LUNG, RUL TARGET 2, BRUSHING:  Squamous cell carcinoma   D. LUNG, RUL TARGET 2, FINE NEEDLE ASPIRATION  BIOPSY:  Squamous cell carcinoma  See comment   FINAL MICROSCOPIC DIAGNOSIS:  E. LUNG, RLL, FINE NEEDLE ASPIRATION  BIOPSY:  Rare atypical cells suspicious for non-small cell carcinoma  See comment   COMMENT:  There are rare atypical cells suspicious for non-small cell carcinoma.  Immunohistochemistry will be attempted on the cellblock and reported as  an addendum.   ADDENDUM:  Immunohistochemistry is performed on the cellblock and there are rare aggregates of markedly  atypical cells consistent with non-small cell carcinoma which are positive with p40 and negative for TTF-1. Although the amount of cellularity in the cellblock is limited, the p40 positivity is most consistent with squamous cell carcinoma.   CT Chest 10/24/2023: Lungs/Pleura: Centrilobular emphysema. Major airways are patent with trace layering secretions in the right bronchus intermedius on series 5, image 100. No pneumothorax. No pleural effusion. Curvilinear, platelike, and partially nodular and calcified opacity in the right lower lobe most resembles postinflammatory scarring, with right lung base opacification in 2023. Nearby small calcified granuloma in the medial basal segment on series 5, image 140. No pulmonary contusion identified.   However, indeterminate appearing 14 mm medial right apical solid lung nodule also on series 6, image 40, series 5, image 45.   CT Chest 06/08/2024: IMPRESSION: 1. Increasingly broad band  of consolidation in the right lower lobe, contiguous with the lower hilar structures, extending laterally to the pleural surface and also extending posteriorly. This could be due to progression of the previously noted scarring process or due to infection or aspiration. Underlying mass is difficult to exclude in the lower lateral perihilar area where there is a 2.0 x 1.9 cm area within the consolidation without air bronchograms. Consider PET-CT. 2. Stable 1.8 x 1.4 cm partially pleural-based nodule in the medial right upper lobe apex. 3-6 month follow-up study recommended. 3. Increased circumferential pericardial fluid, maximum thickness 1.5 cm on the right. 4. Aortic and coronary artery atherosclerosis. 5. Emphysema. 6. Trace pleural effusions new from the prior study. 7. Paucity of body fat with interval worsening since the prior study consistent with history of malnutrition.   PET/CT 07/06/2024: IMPRESSION: 1. 1.4 x 1.0 cm soft tissue nodule along the  pleura of the medial right apex is hypermetabolic and concerning for primary bronchogenic neoplasm. 2. Atypical cystic lesion in the right upper lobe potentially with a tiny nodular component shows low level hypermetabolism. Given the hypermetabolic activity in this region with relative lack of soft tissue component, neoplasm is a concern with synchronous primary a distinct consideration. 3. Consolidative opacity in the right lower lobe including a 1.8 cm nodular component shows no substantial hypermetabolism, likely post infectious/inflammatory. 4. Tiny hypermetabolic focus in the region of the anterior anus. Correlation with physical exam recommended to exclude mucosal lesion. 5. Hepatic steatosis. 6. Emphysema. 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 RLL and RUL nodules c/w NSCLC - squamous subtype. Therefore, the patient would not be a candidate for surgery. Also, PFTs are borderline with DLCO 56%. Therefore, will refer to medical and radiation oncology for treatment discussion. Malignant neoplasm of unspecified part of unspecified bronchus or lung (HCC) Same as above. Chronic obstructive pulmonary disease with emphysema, unspecified emphysema type (HCC) Grade 1, Class A COPD. No need for treatment at this point.  Orders Placed This Encounter  Procedures   MR BRAIN W WO CONTRAST   Ambulatory referral to Hematology / Oncology   Ambulatory referral to Radiation Oncology   Ambulatory referral to Cardiothoracic Surgery   I spent 32 minutes reviewing patient's chart including prior consultant notes, imaging, and PFTs as well as face-to-face with the patient, over half in discussion of the diagnosis and the importance of compliance with the treatment plan.  Return in about 6 months (around 02/16/2025).   Gina Mcmahan, MD "

## 2024-08-19 ENCOUNTER — Ambulatory Visit (HOSPITAL_BASED_OUTPATIENT_CLINIC_OR_DEPARTMENT_OTHER): Admitting: Pulmonary Disease

## 2024-08-19 ENCOUNTER — Ambulatory Visit (INDEPENDENT_AMBULATORY_CARE_PROVIDER_SITE_OTHER)

## 2024-08-19 ENCOUNTER — Encounter (HOSPITAL_BASED_OUTPATIENT_CLINIC_OR_DEPARTMENT_OTHER): Payer: Self-pay | Admitting: Pulmonary Disease

## 2024-08-19 VITALS — BP 132/87 | HR 78 | Ht 64.0 in | Wt 91.2 lb

## 2024-08-19 DIAGNOSIS — J432 Centrilobular emphysema: Secondary | ICD-10-CM

## 2024-08-19 DIAGNOSIS — J439 Emphysema, unspecified: Secondary | ICD-10-CM | POA: Diagnosis not present

## 2024-08-19 DIAGNOSIS — F17200 Nicotine dependence, unspecified, uncomplicated: Secondary | ICD-10-CM

## 2024-08-19 DIAGNOSIS — R918 Other nonspecific abnormal finding of lung field: Secondary | ICD-10-CM

## 2024-08-19 DIAGNOSIS — C3491 Malignant neoplasm of unspecified part of right bronchus or lung: Secondary | ICD-10-CM | POA: Diagnosis not present

## 2024-08-19 DIAGNOSIS — C3490 Malignant neoplasm of unspecified part of unspecified bronchus or lung: Secondary | ICD-10-CM

## 2024-08-19 LAB — PULMONARY FUNCTION TEST
DL/VA % pred: 59 %
DL/VA: 2.48 ml/min/mmHg/L
DLCO cor % pred: 53 %
DLCO cor: 10.41 ml/min/mmHg
DLCO unc % pred: 56 %
DLCO unc: 11.13 ml/min/mmHg
FEF 25-75 Pre: 1.27 L/s
FEF2575-%Pred-Pre: 65 %
FEV1-%Pred-Pre: 83 %
FEV1-Pre: 1.91 L
FEV1FVC-%Pred-Pre: 90 %
FEV6-%Pred-Pre: 96 %
FEV6-Pre: 2.78 L
FEV6FVC-%Pred-Pre: 104 %
FVC-%Pred-Pre: 91 %
FVC-Pre: 2.78 L
Pre FEV1/FVC ratio: 69 %
Pre FEV6/FVC Ratio: 100 %
RV % pred: 124 %
RV: 2.69 L
TLC % pred: 106 %
TLC: 5.4 L

## 2024-08-19 NOTE — Progress Notes (Signed)
 Full PFT w/o post bronchodilator performed today.

## 2024-08-19 NOTE — Patient Instructions (Signed)
  VISIT SUMMARY: Today, we discussed your recent diagnosis of early-stage lung cancer in the right upper lobe of your lung. We also reviewed your COPD and emphysema, and talked about your ongoing tobacco use.  YOUR PLAN: RIGHT UPPER LOBE LUNG CANCER: You have been diagnosed with early-stage lung cancer in the right upper lobe of your lung. A PET scan showed no signs of the cancer spreading, and we are waiting for a brain MRI to confirm this. -You will be referred to a thoracic surgeon for a surgical evaluation. -You will also be referred to a medical oncologist and a radiation oncologist to discuss further treatment options. -We will order a brain MRI to rule out any spread of the cancer. -Your case will be presented at the tumor board meeting on Thursday for a multidisciplinary discussion.  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 Currently, you do not have shortness of breath and do not need regular inhaler use. -You can use albuterol  as needed if you experience shortness of breath.  TOBACCO USE DISORDER: You continue to smoke, which affects your lung health and cancer treatment outcomes. We discussed the benefits of quitting smoking and the use of nicotine  replacement therapies. -We recommend using nicotine  patches and lozenges to help you quit smoking. -Please contact 1-800-QUIT-NOW for additional support in quitting smoking.  Contains text generated by [employer].

## 2024-08-19 NOTE — Patient Instructions (Addendum)
 Full PFT w/o post bronchodilator performed today.

## 2024-08-20 ENCOUNTER — Telehealth: Payer: Self-pay | Admitting: *Deleted

## 2024-08-20 NOTE — Telephone Encounter (Signed)
 Left voicemail with return number for pt to call and discuss referral

## 2024-08-21 ENCOUNTER — Other Ambulatory Visit: Payer: Self-pay

## 2024-08-21 ENCOUNTER — Telehealth: Payer: Self-pay | Admitting: Pulmonary Disease

## 2024-08-21 ENCOUNTER — Encounter: Payer: Self-pay | Admitting: *Deleted

## 2024-08-21 DIAGNOSIS — C3491 Malignant neoplasm of unspecified part of right bronchus or lung: Secondary | ICD-10-CM

## 2024-08-21 NOTE — Telephone Encounter (Signed)
 I talked to patient about tumor board discussion today. I will put in referral to thoracic surgery for consideration of RUL lobectomy. She already has medical oncology appt. Radiation oncology referral provided but not scheduled yet.

## 2024-08-21 NOTE — Progress Notes (Signed)
 [facility] testing including Her2, PD-1 requested on accession number [id number] Specimen D

## 2024-08-21 NOTE — Progress Notes (Signed)
 The proposed treatment discussed in conference is for discussion purpose only and is not a binding recommendation.  The patients have not been physically examined, or presented with their treatment options.  Therefore, final treatment plans cannot be decided.

## 2024-08-21 NOTE — Progress Notes (Unsigned)
 PATIENT NAVIGATOR PROGRESS NOTE  Name: Gina Cherry Date: 08/21/2024 MRN: [id number]  DOB: [birth year]   Reason for visit:  New Patient appt  Comments:  Called and spoke with pt and have confirmed appt with Dr Cloretta on 09/04/24 at 1:40pm. Reviewed directions to building and parking as well as contact number to call with any questions      Time spent counseling/coordinating care: 30-45 minutes

## 2024-08-22 ENCOUNTER — Ambulatory Visit (HOSPITAL_BASED_OUTPATIENT_CLINIC_OR_DEPARTMENT_OTHER)
Admission: RE | Admit: 2024-08-22 | Discharge: 2024-08-22 | Disposition: A | Discharge status: Home / Self Care | Source: Ambulatory Visit | Attending: Pulmonary Disease | Admitting: Pulmonary Disease

## 2024-08-22 DIAGNOSIS — C3490 Malignant neoplasm of unspecified part of unspecified bronchus or lung: Secondary | ICD-10-CM | POA: Insufficient documentation

## 2024-08-22 DIAGNOSIS — R9089 Other abnormal findings on diagnostic imaging of central nervous system: Secondary | ICD-10-CM | POA: Diagnosis not present

## 2024-08-22 DIAGNOSIS — I6782 Cerebral ischemia: Secondary | ICD-10-CM | POA: Diagnosis not present

## 2024-08-22 MED ORDER — GADOBUTROL 1 MMOL/ML IV SOLN
4.0000 mL | Freq: Once | INTRAVENOUS | Status: AC | PRN
  Administered 2024-08-22: 4 mL via INTRAVENOUS
  Filled 2024-08-22: qty 4

## 2024-08-22 NOTE — Telephone Encounter (Signed)
 Rad oncology appt scheduled for 11/10

## 2024-08-24 NOTE — Progress Notes (Signed)
 Radiation Oncology         [phone] ________________________________  Initial Outpatient Consultation  Name: Gina Cherry MRN: [id number]  Date: 08/25/2024  DOB: [birth year]  RR:Ejuzo, Libby, DO  Alghanim, Fahid, MD   REFERRING PHYSICIAN: Taya Cools, MD  DIAGNOSIS: There were no encounter diagnoses.  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 female current smoker with an extensive smoking history. Today, she is accompanied by ***. she is seen as a courtesy of Dr. Alghanim for an opinion concerning radiation therapy as part of management for her recently diagnosed right lung cancer.   She initially presented to the ED and was admitted overnight on 10/24/23 after suffering a fall. Work-up performed at that time included a CT CAP which incidentally revealed an indeterminate 14 mm lung nodule in the medial right apex, as well as possible partially calcified right lower lobe lung scarring. She did initially meet SIRS criteria on presentation although her labs and vitals stabilized after receiving IV fluids. Labs at that time also indicated a very high blood ethanol level (indicating that her fall was likely due to alcohol intoxication). Her hospital course also consisted of a head and cervical spine CT which were negative for acute trauma, and laceration repair. As for her chest CT findings, she was advised at discharge to proceed with follow-up imaging in 3 months.   She evidently did not present for repeat imaging in April and eventually presented for a follow-up chest CT on 05/27/24 which demonstrated: an increasingly broad band of consolidation in the right lower lobe, contiguous with the lower hilar structures, and extending laterally and posteriorly to the pleural surface. An underlying mass was difficult to exclude in the lower lateral perihilar area of this finding given the  presence of a 2.0 x 1.9 cm area within the consolidation without air bronchograms. Imaging also showed stability of the 1.8 x 1.4 cm partially pleural-based nodule in the medial right upper lobe apex; increased circumferential pericardial fluid measuring approximately 1.5 cm in thickness on the right; and new trace pleural effusions.   A PET scan was then performed on 07/03/24 which demonstrated: hypermetabolism associated with the 1.4 x 1.0 cm soft tissue nodule along the pleura of the medial right apex, concerning for primary bronchogenic neoplasm; low level hypermetabolism associated with an atypical cystic lesion in the right upper lobe potentially with a tiny nodular component; a consolidative opacity in the right lower lobe including a 1.8 cm nodular component without substantial hypermetabolism; and a tiny hypermetabolic focus in the region of the anterior anus.  Given these findings, she was accordingly referred to pulmonology for further evaluation and seen in consultation by Dr. Petrice on 10/07 [facility]). Symptoms reported by the patient at that time included dyspnea and a weight loss of approximately 10 lbs since January 2025. Dr. Ellese reviewed her PET results and recommended proceeding with tissue sampling and a super D chest CT for further evaluation.   Subsequent super-D chest CT on 08/01/24 showed: no significant change in size of the subpleural nodule in the medial right apex measuring 1.4 cm; significant interval improvement of the dense, heterogeneous consolidation of the infrahilar right lung, consistent with a resolving area of infection or aspiration; and no significant change in the moderately suspicious cystic lesion in the right upper lobe measuring 1.8 x 1.7 cm (with some associated peripheral nodularity).  Based on Dr. Alois recommendations, she opted to proceed with a bronchoscopy and biopsies of the RUL and RLL on 08/11/24 under the  care of Dr. Shelah. Cytology showed: non-small cell carcinoma from target 1 FNA of the RUL; squamous cell carcinoma from target 2 FNA and brushings of the RUL; and non-small cell carcinoma consistent with squamous cell carcinoma from FNA of the RLL.   She is not a good surgical candidate given her borderline PFT's and squamous histology. Dr. Alghanim has subsequently recommended radiation therapy which we will discuss in detail together today.   Other pertinent imaging performed thus far includes an MRI of the brain on 08/22/24 (to complete her staging work-up). Results are pending at this time. ***   PREVIOUS RADIATION THERAPY: No  PAST MEDICAL HISTORY:  Past Medical History:  Diagnosis Date   COPD (chronic obstructive pulmonary disease) (HCC)    Emphysema lung (HCC)    H/O Legionnaire's disease    2023   History of adult respiratory distress syndrome (ARDS)    2023   Osteoporosis    Pneumonia    Takotsubo cardiomyopathy    2023   Underweight     PAST SURGICAL HISTORY: Past Surgical History:  Procedure Laterality Date   VIDEO BRONCHOSCOPY WITH ENDOBRONCHIAL NAVIGATION Right 08/11/2024   Procedure: VIDEO BRONCHOSCOPY WITH ENDOBRONCHIAL NAVIGATION;  Surgeon: Shelah Lamar RAMAN, MD;  Location: [facility] END[facility];  Service: Pulmonary;  Laterality: Right;    FAMILY HISTORY:  Family History  Problem Relation Age of Onset   Heart disease Father    Hypertension Brother     SOCIAL HISTORY:  Social History   Tobacco Use   Smoking status: Every Day    Current packs/day: 0.50    Average packs/day: 0.5 packs/day for 40.9 years (20.4 ttl pk-yrs)    Types: Cigarettes    Start date: 33   Smokeless tobacco: Never  Vaping Use   Vaping status: Never Used  Substance Use Topics   Alcohol use: Yes    Alcohol/week: 4.0 standard drinks of alcohol    Types: 4 Glasses of wine per week    Comment: 3-4 glasses a week   Drug use: Never    ALLERGIES:  Allergies  Allergen Reactions   Codeine      unknown   Codeine Other (See Comments)    Unknown reaction (possibly sick stomach)    MEDICATIONS:  Current Outpatient Medications  Medication Sig Dispense Refill   acetaminophen  (TYLENOL ) 325 MG tablet Take 2 tablets (650 mg total) by mouth every 6 (six) hours as needed for mild pain (pain score 1-3) (or Fever >/= 101). 30 tablet 0   acetaminophen  (TYLENOL ) 500 MG tablet Take 500 mg by mouth every 6 (six) hours as needed for headache (pain). (Patient not taking: Reported on 08/19/2024)     alendronate (FOSAMAX) 70 MG tablet TAKE 1 TABLET BY MOUTH 1 TIME A WEEK EARLY IN THE MORNING. DO NOT LIE DOWN OR HAVE ANYTHING BUT WATER  TO DRINK FOR 30 MINUTES (Patient not taking: Reported on 08/19/2024)     alendronate (FOSAMAX) 70 MG tablet Take 70 mg by mouth once a week. Saturday 10/20/23 last dose.     Multiple Vitamin (MULTIVITAMIN WITH MINERALS) TABS tablet Take 1 tablet by mouth at bedtime. (Patient not taking: Reported on 08/19/2024)     Multiple Vitamin (MULTIVITAMIN WITH MINERALS) TABS tablet Take 1 tablet by mouth daily. 30 tablet 0   Pramoxine-HC (HYDROCORTISONE ACE-PRAMOXINE) 2.5-1 % CREA Apply 1 application topically 2 (two)  times daily as needed (hemorrhoids). (Patient not taking: Reported on 08/19/2024)     PREVIDENT 5000 BOOSTER PLUS 1.1 % PSTE Place onto teeth as directed.     No current facility-administered medications for this encounter.    REVIEW OF SYSTEMS:  A 10+ POINT REVIEW OF SYSTEMS WAS OBTAINED including neurology, dermatology, psychiatry, cardiac, respiratory, lymph, extremities, GI, GU, musculoskeletal, constitutional, reproductive, HEENT. ***   PHYSICAL EXAM:  vitals were not taken for this visit.   General: Alert and oriented, in no acute distress HEENT: Head is normocephalic. Extraocular movements are intact. Oropharynx is clear. Neck: Neck is supple, no palpable cervical or supraclavicular lymphadenopathy. Heart: Regular in rate and rhythm with no murmurs, rubs, or  gallops. Chest: Clear to auscultation bilaterally, with no rhonchi, wheezes, or rales. Abdomen: Soft, nontender, nondistended, with no rigidity or guarding. Extremities: No cyanosis or edema. Lymphatics: see Neck Exam Skin: No concerning lesions. Musculoskeletal: symmetric strength and muscle tone throughout. Neurologic: Cranial nerves II through XII are grossly intact. No obvious focalities. Speech is fluent. Coordination is intact. Psychiatric: Judgment and insight are intact. Affect is appropriate. ***  ECOG = ***  0 - Asymptomatic (Fully active, able to carry on all predisease activities without restriction)  1 - Symptomatic but completely ambulatory (Restricted in physically strenuous activity but ambulatory and able to carry out work of a light or sedentary nature. For example, light housework, office work)  2 - Symptomatic, <50% in bed during the day (Ambulatory and capable of all self care but unable to carry out any work activities. Up and about more than 50% of waking hours)  3 - Symptomatic, >50% in bed, but not bedbound (Capable of only limited self-care, confined to bed or chair 50% or more of waking hours)  4 - Bedbound (Completely disabled. Cannot carry on any self-care. Totally confined to bed or chair)  5 - Death   Raylene MM, Creech RH, Tormey DC, et al. [phone]). Toxicity and response criteria of the [facility]. Am. DOROTHA Bridges. Oncol. 5 (6): 649-55  LABORATORY DATA:  Lab Results  Component Value Date   WBC 10.1 08/11/2024   HGB 15.9 (H) 08/11/2024   HCT 44.4 08/11/2024   MCV 104.5 (H) 08/11/2024   PLT 287 08/11/2024   NEUTROABS 4.6 12/14/2021   Lab Results  Component Value Date   NA 142 08/11/2024   K 3.0 (L) 08/11/2024   CL 99 08/11/2024   CO2 25 08/11/2024   GLUCOSE 120 (H) 08/11/2024   BUN <5 (L) 08/11/2024   CREATININE 0.53 08/11/2024   CALCIUM  8.9 08/11/2024      RADIOGRAPHY: DG Chest Port 1 View Result Date:  08/11/2024 EXAM: 1 VIEW(S) XRAY OF THE CHEST 08/11/2024 02:33:00 PM COMPARISON: CT chest 08/01/2024 CLINICAL HISTORY: S/P bronchoscopy with biopsy [id number]. S/P bronchoscopy with biopsy [id number], right side FINDINGS: LUNGS AND PLEURA: Biopsy clip in right upper lobe. Ill-defined opacity in medial right lung apex, corresponding to know lung lesion. Unchanged linear opacity in right lower lung responding to scarring and nodularity as demonstrated on recent CT of the chest. No pulmonary edema. No pleural effusion. No pneumothorax following bronchoscopy. HEART AND MEDIASTINUM: Aortic atherosclerosis. No acute abnormality of the cardiac and mediastinal silhouettes. BONES AND SOFT TISSUES: No acute osseous abnormality. IMPRESSION: 1. No pneumothorax following bronchoscopy. Electronically signed by: Waddell Calk MD 08/11/2024 03:34 PM EDT RP Workstation: [id number]   DG C-ARM BRONCHOSCOPY Result Date: 08/11/2024 C-ARM BRONCHOSCOPY: Fluoroscopy was utilized by the requesting physician.  No  radiographic interpretation.   DG C-Arm 1-60 Min-No Report Result Date: 08/11/2024 Fluoroscopy was utilized by the requesting physician.  No radiographic interpretation.   CT Super D Chest Wo Contrast Result Date: 08/05/2024 CLINICAL DATA:  Hypermetabolic pulmonary nodule of the medial right apex * Tracking Code: BO * EXAM: CT CHEST WITHOUT CONTRAST TECHNIQUE: Multidetector CT imaging of the chest was performed using thin slice collimation for electromagnetic bronchoscopy planning purposes, without intravenous contrast. RADIATION DOSE REDUCTION: This exam was performed according to the departmental dose-optimization program which includes automated exposure control, adjustment of the mA and/or kV according to patient size and/or use of iterative reconstruction technique. COMPARISON:  PET-CT, 07/03/2024 FINDINGS: Cardiovascular: Aortic atherosclerosis. Normal heart size. Three-vessel coronary artery calcifications. No  pericardial effusion. Mediastinum/Nodes: No enlarged mediastinal, hilar, or axillary lymph nodes. Thyroid  gland, trachea, and esophagus demonstrate no significant findings. Lungs/Pleura: Moderate centrilobular emphysema. Unchanged subpleural nodule of the medial right apex measuring 1.4 x 1.2 cm (series 4, image 24). Significant interval improvement in dense, heterogeneous consolidation of the infrahilar right lung, now with predominantly bandlike scarring of the right lung base with and associated coarsely calcified nodularity (series 4, image 114). Unchanged cystic lesion in the right upper lobe measuring 1.8 x 1.7 cm with some associated peripheral nodularity (series 4, image 36). No pleural effusion or pneumothorax. Upper Abdomen: No acute abnormality.  Hepatic steatosis. Musculoskeletal: No chest wall abnormality. No acute osseous findings. IMPRESSION: 1. Unchanged subpleural nodule of the medial right apex measuring 1.4 x 1.2 cm, previously FDG avid highly suspicious for primary lung malignancy. 2. Significant interval improvement in dense, heterogeneous consolidation of the infrahilar right lung, now with predominantly bandlike scarring of the right lung base with and associated coarsely calcified nodularity. Findings are consistent with resolving infection or aspiration an underlying chronic sequelae. 3. Unchanged cystic lesion in the right upper lobe measuring 1.8 x 1.7 cm with some associated peripheral nodularity. This is moderately suspicious for cystic adenocarcinoma and warrants continued attention on follow-up. 4. Emphysema. 5. Coronary artery disease. 6. Hepatic steatosis. Aortic Atherosclerosis (ICD10-I70.0) and Emphysema (ICD10-J43.9). Electronically Signed   By: Marolyn JONETTA Jaksch M.D.   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  We  discussed the natural history of *** and general treatment, highlighting the role of radiotherapy in the management.  We discussed the available radiation techniques, and focused on the details of logistics and delivery.  We reviewed the anticipated acute and late sequelae associated with radiation in this setting.  The patient was encouraged to ask questions that I answered to the best of my ability. *** A patient consent form was discussed and signed.  We retained a copy for our records.  The patient would like to proceed with radiation and will be scheduled for CT simulation.  PLAN: ***    *** minutes of total time was spent for this patient encounter, including preparation, face-to-face counseling with the patient and coordination of care, physical exam, and documentation of the encounter.   ------------------------------------------------  Lynwood CHARM Nasuti, PhD, MD  This document serves as a record of services personally performed by Lynwood Nasuti, MD. It was created on his behalf by Dorthy Fuse, a trained medical scribe. The creation of this record is based on the scribe's personal observations and the provider's statements to them.  This document has been checked and approved by the attending provider.

## 2024-08-25 ENCOUNTER — Ambulatory Visit
Admission: RE | Admit: 2024-08-25 | Discharge: 2024-08-25 | Disposition: A | Discharge status: Home / Self Care | Source: Ambulatory Visit | Attending: Radiation Oncology | Admitting: Radiation Oncology

## 2024-08-25 ENCOUNTER — Ambulatory Visit: Payer: Self-pay | Admitting: Pulmonary Disease

## 2024-08-25 ENCOUNTER — Encounter: Payer: Self-pay | Admitting: Radiation Oncology

## 2024-08-25 DIAGNOSIS — J90 Pleural effusion, not elsewhere classified: Secondary | ICD-10-CM | POA: Diagnosis not present

## 2024-08-25 DIAGNOSIS — R918 Other nonspecific abnormal finding of lung field: Secondary | ICD-10-CM

## 2024-08-25 DIAGNOSIS — M810 Age-related osteoporosis without current pathological fracture: Secondary | ICD-10-CM | POA: Insufficient documentation

## 2024-08-25 DIAGNOSIS — J432 Centrilobular emphysema: Secondary | ICD-10-CM | POA: Insufficient documentation

## 2024-08-25 DIAGNOSIS — F17210 Nicotine dependence, cigarettes, uncomplicated: Secondary | ICD-10-CM | POA: Insufficient documentation

## 2024-08-25 DIAGNOSIS — I2510 Atherosclerotic heart disease of native coronary artery without angina pectoris: Secondary | ICD-10-CM | POA: Diagnosis not present

## 2024-08-25 DIAGNOSIS — C3411 Malignant neoplasm of upper lobe, right bronchus or lung: Secondary | ICD-10-CM

## 2024-08-25 DIAGNOSIS — C3431 Malignant neoplasm of lower lobe, right bronchus or lung: Secondary | ICD-10-CM | POA: Insufficient documentation

## 2024-08-25 DIAGNOSIS — Z7983 Long term (current) use of bisphosphonates: Secondary | ICD-10-CM | POA: Insufficient documentation

## 2024-08-25 DIAGNOSIS — I700 Atherosclerosis of aorta: Secondary | ICD-10-CM | POA: Diagnosis not present

## 2024-08-25 DIAGNOSIS — I5181 Takotsubo syndrome: Secondary | ICD-10-CM | POA: Diagnosis not present

## 2024-08-25 DIAGNOSIS — K760 Fatty (change of) liver, not elsewhere classified: Secondary | ICD-10-CM | POA: Insufficient documentation

## 2024-08-25 DIAGNOSIS — Z8701 Personal history of pneumonia (recurrent): Secondary | ICD-10-CM | POA: Diagnosis not present

## 2024-08-25 DIAGNOSIS — J439 Emphysema, unspecified: Secondary | ICD-10-CM | POA: Insufficient documentation

## 2024-08-25 DIAGNOSIS — Z923 Personal history of irradiation: Secondary | ICD-10-CM | POA: Insufficient documentation

## 2024-08-25 NOTE — Progress Notes (Signed)
 Pt.notified

## 2024-08-25 NOTE — Progress Notes (Signed)
 Location of tumor and Histology per Pathology Report:  Right upper lobe  Biopsy:    Past/Anticipated interventions by surgeon, if any:    Video Bronchoscopy with Robotic Assisted Bronchoscopic Navigation      Past/Anticipated interventions by medical oncology, if any: Patient to see Dr. Cloretta on 09/04/24    Pain issues, if any:  no    SAFETY ISSUES: Prior radiation? no Pacemaker/ICD? no Possible current pregnancy? no Is the patient on methotrexate? no  Current Complaints / other details:      BP (!) (P) 158/93 (BP Location: Left Arm, Patient Position: Sitting)   Pulse (P) 95   Temp (!) (P) 97.1 F (36.2 C) (Temporal)   Resp (P) 18   Ht (P) 5' 4 (1.626 m)   Wt (P) 90 lb (40.8 kg)   SpO2 (P) 99%   BMI (P) 15.45 kg/m

## 2024-08-27 DIAGNOSIS — C3491 Malignant neoplasm of unspecified part of right bronchus or lung: Secondary | ICD-10-CM | POA: Diagnosis not present

## 2024-08-29 ENCOUNTER — Encounter: Admitting: Thoracic Surgery (Cardiothoracic Vascular Surgery)

## 2024-09-01 DIAGNOSIS — C3491 Malignant neoplasm of unspecified part of right bronchus or lung: Secondary | ICD-10-CM | POA: Diagnosis not present

## 2024-09-02 DIAGNOSIS — C3491 Malignant neoplasm of unspecified part of right bronchus or lung: Secondary | ICD-10-CM | POA: Diagnosis not present

## 2024-09-04 ENCOUNTER — Inpatient Hospital Stay: Attending: Oncology | Admitting: Oncology

## 2024-09-04 ENCOUNTER — Encounter: Payer: Self-pay | Admitting: Oncology

## 2024-09-04 ENCOUNTER — Encounter: Payer: Self-pay | Admitting: *Deleted

## 2024-09-04 VITALS — BP 144/85 | HR 93 | Temp 98.0°F | Resp 18 | Ht 64.0 in | Wt 97.2 lb

## 2024-09-04 DIAGNOSIS — C3411 Malignant neoplasm of upper lobe, right bronchus or lung: Secondary | ICD-10-CM | POA: Insufficient documentation

## 2024-09-04 DIAGNOSIS — R85613 High grade squamous intraepithelial lesion on cytologic smear of anus (HGSIL): Secondary | ICD-10-CM

## 2024-09-04 DIAGNOSIS — J449 Chronic obstructive pulmonary disease, unspecified: Secondary | ICD-10-CM

## 2024-09-04 DIAGNOSIS — Z87891 Personal history of nicotine dependence: Secondary | ICD-10-CM | POA: Diagnosis not present

## 2024-09-04 DIAGNOSIS — C3490 Malignant neoplasm of unspecified part of unspecified bronchus or lung: Secondary | ICD-10-CM | POA: Insufficient documentation

## 2024-09-04 DIAGNOSIS — R918 Other nonspecific abnormal finding of lung field: Secondary | ICD-10-CM

## 2024-09-04 NOTE — Progress Notes (Signed)
 PATIENT NAVIGATOR PROGRESS NOTE  Name: Gina Cherry Date: 09/04/2024 MRN: [id number]  DOB: 01-15-1956   Reason for visit:  New pt appt  Comments:  Met with [name] during appt with Dr Cloretta  Seeing Dr Shyrl 11/21 Has been evaluated by radiation oncology for SBRT if not surgical candidate Will return here in 4 months for CT Chest and F/U visit Given Journeys notebook with disease specific information  Pt declined referrals to SW and nutrition at this time Given contact number to call with any questions    Time spent counseling/coordinating care: > 60 minutes

## 2024-09-04 NOTE — Progress Notes (Signed)
 [facility] New Patient Consult   Requesting MD: Tobie Gaines, Do [address], [address] [place],  [place] [address]   Gina Cherry 68 y.o.  1955/12/15    Reason for Consult: Non-small cell lung cancer   HPI: Ms. Lukasik was hospitalized after a fall in January.  A CT chest 10/24/2023 revealed an indeterminate 14 mm nodule at the medial right lung apex.  A CT chest 05/27/2024 revealed a 1.8 x 1.4 cm right upper lobe pleural-based nodule.  A 1.6 cm cavitary lesion in the right upper lobe has increased in size.  There is a possible 2 cm mass in the lower lateral perihilar region.  A partially calcified mass is noted at the right lung base.  There was a pericardial effusion. A PET scan 07/03/2024 revealed a hypermetabolic pleural nodule at the right lung apex, the atypical cystic lesion in the right upper lobe showed low-level hypermetabolism, SUV 4.1.  The consolidative opacity in the right lower lobe is without hypermetabolism.  A tiny hypermetabolic focus was noted at the anterior anus.   She underwent a bronchoscopy by Dr. Shelah with biopsy of 3 target lesions.  The pathology revealed squamous cell carcinoma.  She saw Dr. Shannon and plans to proceed with SBRT if she is not a surgical candidate.  She is scheduled to see Dr. Shyrl tomorrow.   Past Medical History:  Diagnosis Date   COPD (chronic obstructive pulmonary disease) (HCC)    Emphysema lung (HCC)    H/O Legionnaire's disease    2023   History of adult respiratory distress syndrome (ARDS)    2023   Osteoporosis    Pneumonia    Takotsubo cardiomyopathy    2023   Underweight    .  G1, P0, stillborn  Past Surgical History:  Procedure Laterality Date   VIDEO BRONCHOSCOPY WITH ENDOBRONCHIAL NAVIGATION Right 08/11/2024   Procedure: VIDEO BRONCHOSCOPY WITH ENDOBRONCHIAL NAVIGATION;  Surgeon: Shelah Lamar RAMAN, MD;  [facility]: [facility];  Service: Pulmonary;  Laterality: Right;    Medications:  Reviewed  Allergies:  Allergies  Allergen Reactions   Codeine     unknown   Codeine Other (See Comments)    Unknown reaction (possibly sick stomach)    Family history: Family history of cancer  Social History:   She lives with her husband in [place].  She has smoked 1/2 pack cigarettes per day for 40 years.  She has 3-4 liquor drinks per week.  No history of heavy alcohol use.  She is a retired print production planner.  No transfusion history.  ROS:   Positives include: None  A complete ROS was otherwise negative.  Physical Exam:  Blood pressure (!) 144/85, pulse 93, temperature 98 F (36.7 C), temperature source Temporal, resp. rate 18, height 5' 4 (1.626 m), weight 97 lb 3.2 oz (44.1 kg), SpO2 99%.  HEENT: Oropharynx without visible mass, neck without mass Lungs: Clear bilaterally Cardiac: Regular rate and rhythm Abdomen: No hepatosplenomegaly, nontender  Vascular: No leg edema Lymph nodes: No cervical, supraclavicular, axillary, or inguinal nodes Neurologic: Alert and oriented, motor exam appears intact in the upper and lower extremities bilaterally Skin: No rash Musculoskeletal: No spine tenderness   LAB:  CBC  Lab Results  Component Value Date   WBC 10.1 08/11/2024   HGB 15.9 (H) 08/11/2024   HCT 44.4 08/11/2024   MCV 104.5 (H) 08/11/2024   PLT 287 08/11/2024   NEUTROABS 4.6 12/14/2021        CMP  Lab Results  Component Value Date   NA 142 08/11/2024   K 3.0 (L) 08/11/2024   CL 99 08/11/2024   CO2 25 08/11/2024   GLUCOSE 120 (H) 08/11/2024   BUN <5 (L) 08/11/2024   CREATININE 0.53 08/11/2024   CALCIUM  8.9 08/11/2024   PROT 4.3 (L) 10/24/2023   ALBUMIN 2.5 (L) 10/24/2023   AST 75 (H) 10/24/2023   ALT 36 10/24/2023   ALKPHOS 38 10/24/2023   BILITOT 0.7 10/24/2023   GFRNONAA >60 08/11/2024      Imaging: CTs from [place] where he, August, and October 2025 reviewed.  PET 07/03/2024-images reviewed    Assessment/Plan:   Non-small cell lung  cancer 10/24/2023 CT chest: Indeterminate 14 mm right apical solid nodule 05/27/2024 CT chest: 1.8 cm medial right apical nodule, thin-walled cavitary lesion in the right upper lobe increased from 1 to 1.6 cm, stable well-circumscribed partially calcified mass in the lateral basal right lower lobe, pericardial effusion 07/03/2024 PET: Hypermetabolic medial right apical nodule, atypical cystic lesion in the right upper lobe with a potential nodular component with low-level hypermetabolism, consolidative opacity in the right lower lobe without hypermetabolism, tiny hypermetabolic focus in the region of the anterior anus 08/11/2024 navigation bronchoscopy: Target 1 apical/medial right upper lobe nodule, target 2 right upper lobe cavitary nodule, target 3 right lower lobe nodule: Pathology from target 2 brushing and target 2FNA-squamous cell carcinoma, tumor proportion score 10%, combined positive score 10, PD-L1 1%, tumor cells staining 1%, tumor infiltrating immune cells staining 1% PD-L1 expression tumor cells staining 3%.  T p53, RB1, tumor mutation burden 9.5, MSS, HER2 1+ COPD Hypermetabolic anal/rectal lesion on PET 07/03/2024: Patient will be contacted to come in for a rectal examination Osteoporosis History of alcohol use Tobacco use   Disposition:   Ms. Allbritton has been diagnosed with squamous cell carcinoma involving at least 2 of 3 right biopsied right lung lesions.  Her case was presented at the thoracic tumor conference.  Surgery is recommended if she is a surgical candidate.  I doubt she will be a surgical candidate based on the history of COPD and ongoing tobacco use.  She saw Dr. Shannon and appears to be a candidate for SBRT if surgery is not recommended.  The tumor does not have a high level PD-L1 score, so primary immunotherapy will not be recommended.  I will see her for a restaging chest CT in 4 months if she is treated with SBRT.  We will see her sooner if she undergoes surgery.  The  lung lesions are most likely related to a tight focal lung cancer, but metastatic disease is possible.  An area of hypermetabolism was noted at the anus on the PET 07/03/2024.  We will ask her to return for a rectal exam next week.  We will asked pathology to perform immunohistochemical stains if there is additional tissue for testing.  Arley Hof, MD  09/04/2024, 3:07 PM

## 2024-09-05 ENCOUNTER — Ambulatory Visit
Attending: Thoracic Surgery (Cardiothoracic Vascular Surgery) | Admitting: Thoracic Surgery (Cardiothoracic Vascular Surgery)

## 2024-09-05 VITALS — BP 160/85 | HR 95 | Resp 18 | Ht 64.0 in | Wt 90.0 lb

## 2024-09-05 DIAGNOSIS — R918 Other nonspecific abnormal finding of lung field: Secondary | ICD-10-CM | POA: Diagnosis not present

## 2024-09-05 DIAGNOSIS — C3411 Malignant neoplasm of upper lobe, right bronchus or lung: Secondary | ICD-10-CM

## 2024-09-05 NOTE — Progress Notes (Signed)
 [address] [place] [address]             [id number]                   Gina Cherry [facility] Medical Record #[id number] Date of Birth: 09/15/1956  Referring: Cherish Cools, MD Primary Care: Tobie Gaines, DO Primary Cardiologist: Candyce Reek, MD  Chief Complaint:    Chief Complaint  Patient presents with   Lung Mass    Review work up    History of Present Illness:    Gina Cherry is a 68 y.o. female who presents for surgical evaluation of a 2 right upper lobe pulmonary nodules, and 1 right lower lobe pulmonary mass.  Of note one of the right upper lobe nodules is consistent with non-small cell lung cancer, and the other 2 nodules are concerning for non-small cell lung cancer.  She does continue to smoke.  She is hesitant to proceed with any surgery.    Zubrod Score: At the time of surgery this patient's most appropriate activity status/level should be described as: [x]     0    Normal activity, no symptoms []     1    Restricted in physical strenuous activity but ambulatory, able to do out light work []     2    Ambulatory and capable of self care, unable to do work activities, up and about               >50 % of waking hours                              []     3    Only limited self care, in bed greater than 50% of waking hours []     4    Completely disabled, no self care, confined to bed or chair []     5    Moribund     Past Medical History:  Diagnosis Date   COPD (chronic obstructive pulmonary disease) (HCC)    Emphysema lung (HCC)    H/O Legionnaire's disease    2023   History of adult respiratory distress syndrome (ARDS)    2023   Osteoporosis    Pneumonia    Takotsubo cardiomyopathy    2023   Underweight     Past Surgical History:  Procedure Laterality Date   VIDEO BRONCHOSCOPY WITH ENDOBRONCHIAL NAVIGATION Right 08/11/2024   Procedure: VIDEO BRONCHOSCOPY WITH ENDOBRONCHIAL NAVIGATION;  Surgeon: Shelah Lamar RAMAN, MD;  Location[facility];  Service: Pulmonary;  Laterality: Right;    Family History  Problem Relation Age of Onset   Heart disease Father    Hypertension Brother      Social History   Tobacco Use  Smoking Status Every Day   Current packs/day: 0.50   Average packs/day: 0.5 packs/day for 40.9 years (20.4 ttl pk-yrs)   Types: Cigarettes   Start date: 1985  Smokeless Tobacco Never    Social History   Substance and Sexual Activity  Alcohol Use Yes   Alcohol/week: 4.0 standard drinks of alcohol   Types: 4 Glasses of wine per week   Comment: 3-4 glasses a week     Allergies  Allergen Reactions   Codeine     unknown   Codeine Other (See Comments)    Unknown reaction (possibly sick stomach)    Current  Outpatient Medications  Medication Sig Dispense Refill   acetaminophen  (TYLENOL ) 325 MG tablet Take 2 tablets (650 mg total) by mouth every 6 (six) hours as needed for mild pain (pain score 1-3) (or Fever >/= 101). 30 tablet 0   alendronate (FOSAMAX) 70 MG tablet Take 70 mg by mouth once a week. Saturday 10/20/23 last dose.     Multiple Vitamin (MULTIVITAMIN WITH MINERALS) TABS tablet Take 1 tablet by mouth daily. 30 tablet 0   PREVIDENT 5000 BOOSTER PLUS 1.1 % PSTE Place onto teeth as directed.     No current facility-administered medications for this visit.    Review of Systems  Constitutional: Negative.   Respiratory: Negative.    Cardiovascular: Negative.   Neurological: Negative.      PHYSICAL EXAMINATION: BP (!) 160/85 (BP Location: Left Arm)   Pulse 95   Resp 18   Ht 5' 4 (1.626 m)   Wt 90 lb (40.8 kg)   SpO2 95%   BMI 15.45 kg/m  Physical Exam Constitutional:      General: She is not in acute distress.    Appearance: She is not ill-appearing.  Eyes:     Extraocular Movements: Extraocular movements intact.  Cardiovascular:     Rate and Rhythm: Normal rate.  Musculoskeletal:        General: Normal range of motion.     Cervical back:  Normal range of motion.  Skin:    General: Skin is warm and dry.  Neurological:     General: No focal deficit present.     Mental Status: She is alert and oriented to person, place, and time.          I have independently reviewed the above radiology studies  and reviewed the findings with the patient.   Recent Lab Findings: Lab Results  Component Value Date   WBC 10.1 08/11/2024   HGB 15.9 (H) 08/11/2024   HCT 44.4 08/11/2024   PLT 287 08/11/2024   GLUCOSE 120 (H) 08/11/2024   CHOL 197 10/31/2023   TRIG 106 10/31/2023   HDL 66 10/31/2023   LDLCALC 112 (H) 10/31/2023   ALT 36 10/24/2023   AST 75 (H) 10/24/2023   NA 142 08/11/2024   K 3.0 (L) 08/11/2024   CL 99 08/11/2024   CREATININE 0.53 08/11/2024   BUN <5 (L) 08/11/2024   CO2 25 08/11/2024   INR 1.0 10/24/2023   HGBA1C 5.1 10/31/2023    Diagnostic Studies & Laboratory data:     Recent Radiology Findings:   MR BRAIN W WO CONTRAST Result Date: 08/25/2024 EXAM: MRI BRAIN WITH AND WITHOUT CONTRAST 08/22/2024 01:10:00 PM TECHNIQUE: Multiplanar multisequence MRI of the head/brain was performed with and without the administration of intravenous contrast. CONTRAST: 4 mL of Gadavist . COMPARISON: CT head 11/28/2021 and 10/24/2023. CLINICAL HISTORY: Non-small cell lung cancer (NSCLC), staging. FINDINGS: BRAIN AND VENTRICLES: No acute infarct. No acute intracranial hemorrhage. No mass effect or midline shift. No hydrocephalus. The sella is unremarkable. Normal flow voids. T2/FLAIR hyperintensity in the periventricular and subcortical white matter compatible with mild chronic microvascular ischemic changes. Mild age-related parenchymal volume loss. Susceptibility in the left cerebellum suggestive of chronic microhemorrhage. Prominent arachnoid granulations along the right occipital calvarium. No abnormal intracranial enhancement. No evidence of intracranial lesions or intracranial metastatic disease. ORBITS: Bilateral lens  replacement. No acute abnormality. SINUSES: No acute abnormality. BONES AND SOFT TISSUES: Normal bone marrow signal and enhancement. No acute soft tissue abnormality. IMPRESSION: 1. No evidence of intracranial metastatic  disease. 2. Mild chronic microvascular ischemic changes. 3. Mild age-related parenchymal volume loss. 4. Susceptibility in the left cerebellum favored to reflect a chronic microhemorrhage. Electronically signed by: Donnice Mania MD 08/25/2024 02:35 PM EST RP Workstation: [id number]   DG Chest Port 1 View Result Date: 08/11/2024 EXAM: 1 VIEW(S) XRAY OF THE CHEST 08/11/2024 02:33:00 PM COMPARISON: CT chest 08/01/2024 CLINICAL HISTORY: S/P bronchoscopy with biopsy [id number]. S/P bronchoscopy with biopsy [id number], right side FINDINGS: LUNGS AND PLEURA: Biopsy clip in right upper lobe. Ill-defined opacity in medial right lung apex, corresponding to know lung lesion. Unchanged linear opacity in right lower lung responding to scarring and nodularity as demonstrated on recent CT of the chest. No pulmonary edema. No pleural effusion. No pneumothorax following bronchoscopy. HEART AND MEDIASTINUM: Aortic atherosclerosis. No acute abnormality of the cardiac and mediastinal silhouettes. BONES AND SOFT TISSUES: No acute osseous abnormality. IMPRESSION: 1. No pneumothorax following bronchoscopy. Electronically signed by: Waddell Calk MD 08/11/2024 03:34 PM EDT RP Workstation: [id number]   DG C-ARM BRONCHOSCOPY Result Date: 08/11/2024 C-ARM BRONCHOSCOPY: Fluoroscopy was utilized by the requesting physician.  No radiographic interpretation.   DG C-Arm 1-60 Min-No Report Result Date: 08/11/2024 Fluoroscopy was utilized by the requesting physician.  No radiographic interpretation.     PFTs:  - FVC: 91% - FEV1: 83% -DLCO: 53%  FINAL MICROSCOPIC DIAGNOSIS:  E. LUNG, RLL, FINE NEEDLE ASPIRATION  BIOPSY:  Rare atypical cells suspicious for non-small cell carcinoma  See comment    FINAL MICROSCOPIC  DIAGNOSIS:  A. LUNG, RUL, FINE NEEDLE ASPIRATION  BIOPSY:  Rare malignant cells consistent with non-small cell carcinoma   B. LUNG, RUL, BRUSHING:  No malignant cells identified  Blood   C. LUNG, RUL TARGET 2, BRUSHING:  Squamous cell carcinoma   D. LUNG, RUL TARGET 2, FINE NEEDLE ASPIRATION  BIOPSY:  Squamous cell carcinoma  See comment    Assessment / Plan:   68 y.o. female with synchronous primaries on the right lung with marginal lung function.  I do not think that surgery would be able to cure her.  Additionally she does not want any operations.  I agree with her plan of proceeding with SBRT.     I  spent 40 minutes with  the patient face to face in counseling and coordination of care.    Linnie MALVA Rayas 09/05/2024 12:47 PM

## 2024-09-10 ENCOUNTER — Inpatient Hospital Stay: Admitting: Oncology

## 2024-09-10 VITALS — BP 138/69 | HR 98 | Temp 97.8°F | Resp 18 | Ht 64.0 in | Wt 90.5 lb

## 2024-09-10 DIAGNOSIS — C3411 Malignant neoplasm of upper lobe, right bronchus or lung: Secondary | ICD-10-CM

## 2024-09-10 NOTE — Progress Notes (Signed)
  [facility] OFFICE PROGRESS NOTE   Diagnosis: Lung cancer  INTERVAL HISTORY:   Gina Cherry reports feeling well.  No dyspnea.  No difficulty with bowel function.  No bleeding.  Objective:  Vital signs in last 24 hours:  Blood pressure 138/69, pulse 98, temperature 97.8 F (36.6 C), temperature source Temporal, resp. rate 18, height 5' 4 (1.626 m), weight 90 lb 8 oz (41.1 kg), SpO2 99%.     GI: Rectal-normal tone, external skin tags, slight thickening at the rectovaginal septum without a discrete mass, no mass in the anal canal or rectum   Lab Results:  Lab Results  Component Value Date   WBC 10.1 08/11/2024   HGB 15.9 (H) 08/11/2024   HCT 44.4 08/11/2024   MCV 104.5 (H) 08/11/2024   PLT 287 08/11/2024   NEUTROABS 4.6 12/14/2021    CMP  Lab Results  Component Value Date   NA 142 08/11/2024   K 3.0 (L) 08/11/2024   CL 99 08/11/2024   CO2 25 08/11/2024   GLUCOSE 120 (H) 08/11/2024   BUN <5 (L) 08/11/2024   CREATININE 0.53 08/11/2024   CALCIUM  8.9 08/11/2024   PROT 4.3 (L) 10/24/2023   ALBUMIN 2.5 (L) 10/24/2023   AST 75 (H) 10/24/2023   ALT 36 10/24/2023   ALKPHOS 38 10/24/2023   BILITOT 0.7 10/24/2023   GFRNONAA >60 08/11/2024     Medications: I have reviewed the patient's current medications.   Assessment/Plan:   Non-small cell lung cancer 10/24/2023 CT chest: Indeterminate 14 mm right apical solid nodule 05/27/2024 CT chest: 1.8 cm medial right apical nodule, thin-walled cavitary lesion in the right upper lobe increased from 1 to 1.6 cm, stable well-circumscribed partially calcified mass in the lateral basal right lower lobe, pericardial effusion 07/03/2024 PET: Hypermetabolic medial right apical nodule, atypical cystic lesion in the right upper lobe with a potential nodular component with low-level hypermetabolism, consolidative opacity in the right lower lobe without hypermetabolism, tiny hypermetabolic focus in the region of the anterior  anus 08/11/2024 navigation bronchoscopy: Target 1 apical/medial right upper lobe nodule, target 2 right upper lobe cavitary nodule, target 3 right lower lobe nodule: Pathology from target 2 brushing and target 2FNA-squamous cell carcinoma, tumor proportion score 10%, combined positive score 10, PD-L1 1%, tumor cells staining 1%, tumor infiltrating immune cells staining 1% PD-L1 expression tumor cells staining 3%.  T p53, RB1, tumor mutation burden 9.5, MSS, HER2 1+ COPD Hypermetabolic anal/rectal lesion on PET 07/03/2024: Patient will be contacted to come in for a rectal examination Osteoporosis History of alcohol use Tobacco use   Disposition: Gina Cherry appears unchanged.  She has non-small cell lung cancer.  The staging PET revealed an area of uptake near the anus.  There is no anal or rectal mass on physical exam today.  We requested HPV stains on the lung biopsy tissue.  We also requested clarification of the different target lesions on the pathology report.  She is scheduled to see Gina Cherry for radiation planning.  She will return for an office visit and restaging chest CT approximately 3 months following completion of radiation.  Gina Hof, MD  09/10/2024  10:25 AM

## 2024-09-15 ENCOUNTER — Other Ambulatory Visit: Payer: Self-pay | Admitting: *Deleted

## 2024-09-16 ENCOUNTER — Other Ambulatory Visit: Payer: Self-pay | Admitting: *Deleted

## 2024-09-16 DIAGNOSIS — C3411 Malignant neoplasm of upper lobe, right bronchus or lung: Secondary | ICD-10-CM

## 2024-09-16 NOTE — Progress Notes (Signed)
 Referral for reconsult for radiation oncology placed

## 2024-09-29 ENCOUNTER — Ambulatory Visit
Admission: RE | Admit: 2024-09-29 | Discharge: 2024-09-29 | Attending: Radiation Oncology | Admitting: Radiation Oncology

## 2024-09-29 DIAGNOSIS — C3411 Malignant neoplasm of upper lobe, right bronchus or lung: Secondary | ICD-10-CM | POA: Diagnosis present

## 2024-09-29 DIAGNOSIS — Z510 Encounter for antineoplastic radiation therapy: Secondary | ICD-10-CM | POA: Insufficient documentation

## 2024-10-06 LAB — COLOGUARD: COLOGUARD: NEGATIVE

## 2024-10-08 DIAGNOSIS — Z510 Encounter for antineoplastic radiation therapy: Secondary | ICD-10-CM | POA: Diagnosis not present

## 2024-10-14 ENCOUNTER — Other Ambulatory Visit: Payer: Self-pay

## 2024-10-14 ENCOUNTER — Ambulatory Visit
Admission: RE | Admit: 2024-10-14 | Discharge: 2024-10-14 | Disposition: A | Discharge status: Home / Self Care | Source: Ambulatory Visit | Attending: Radiation Oncology | Admitting: Radiation Oncology

## 2024-10-14 DIAGNOSIS — Z510 Encounter for antineoplastic radiation therapy: Secondary | ICD-10-CM | POA: Diagnosis not present

## 2024-10-14 DIAGNOSIS — C3411 Malignant neoplasm of upper lobe, right bronchus or lung: Secondary | ICD-10-CM

## 2024-10-14 LAB — RAD ONC ARIA SESSION SUMMARY
Course Elapsed Days: 0
Plan Fractions Treated to Date: 1
Plan Fractions Treated to Date: 1
Plan Prescribed Dose Per Fraction: 10 Gy
Plan Prescribed Dose Per Fraction: 18 Gy
Plan Total Fractions Prescribed: 3
Plan Total Fractions Prescribed: 5
Plan Total Prescribed Dose: 50 Gy
Plan Total Prescribed Dose: 54 Gy
Reference Point Dosage Given to Date: 10 Gy
Reference Point Dosage Given to Date: 18 Gy
Reference Point Session Dosage Given: 10 Gy
Reference Point Session Dosage Given: 18 Gy
Session Number: 1

## 2024-10-15 ENCOUNTER — Ambulatory Visit: Admitting: Radiation Oncology

## 2024-10-17 ENCOUNTER — Other Ambulatory Visit: Payer: Self-pay

## 2024-10-17 ENCOUNTER — Ambulatory Visit
Admission: RE | Admit: 2024-10-17 | Discharge: 2024-10-17 | Disposition: A | Discharge status: Home / Self Care | Source: Ambulatory Visit | Attending: Radiation Oncology | Admitting: Radiation Oncology

## 2024-10-17 DIAGNOSIS — C3411 Malignant neoplasm of upper lobe, right bronchus or lung: Secondary | ICD-10-CM | POA: Insufficient documentation

## 2024-10-17 LAB — RAD ONC ARIA SESSION SUMMARY
Course Elapsed Days: 3
Plan Fractions Treated to Date: 2
Plan Fractions Treated to Date: 2
Plan Prescribed Dose Per Fraction: 10 Gy
Plan Prescribed Dose Per Fraction: 18 Gy
Plan Total Fractions Prescribed: 3
Plan Total Fractions Prescribed: 5
Plan Total Prescribed Dose: 50 Gy
Plan Total Prescribed Dose: 54 Gy
Reference Point Dosage Given to Date: 20 Gy
Reference Point Dosage Given to Date: 36 Gy
Reference Point Session Dosage Given: 10 Gy
Reference Point Session Dosage Given: 18 Gy
Session Number: 2

## 2024-10-20 ENCOUNTER — Other Ambulatory Visit: Payer: Self-pay

## 2024-10-20 ENCOUNTER — Ambulatory Visit
Admission: RE | Admit: 2024-10-20 | Discharge: 2024-10-20 | Disposition: A | Discharge status: Home / Self Care | Source: Ambulatory Visit | Attending: Radiation Oncology | Admitting: Radiation Oncology

## 2024-10-20 DIAGNOSIS — C3411 Malignant neoplasm of upper lobe, right bronchus or lung: Secondary | ICD-10-CM | POA: Diagnosis not present

## 2024-10-20 LAB — RAD ONC ARIA SESSION SUMMARY
Course Elapsed Days: 6
Plan Fractions Treated to Date: 3
Plan Fractions Treated to Date: 3
Plan Prescribed Dose Per Fraction: 10 Gy
Plan Prescribed Dose Per Fraction: 18 Gy
Plan Total Fractions Prescribed: 3
Plan Total Fractions Prescribed: 5
Plan Total Prescribed Dose: 50 Gy
Plan Total Prescribed Dose: 54 Gy
Reference Point Dosage Given to Date: 30 Gy
Reference Point Dosage Given to Date: 54 Gy
Reference Point Session Dosage Given: 10 Gy
Reference Point Session Dosage Given: 18 Gy
Session Number: 3

## 2024-10-21 ENCOUNTER — Ambulatory Visit: Admitting: Radiation Oncology

## 2024-10-22 ENCOUNTER — Other Ambulatory Visit: Payer: Self-pay

## 2024-10-22 ENCOUNTER — Ambulatory Visit
Admission: RE | Admit: 2024-10-22 | Discharge: 2024-10-22 | Disposition: A | Discharge status: Home / Self Care | Source: Ambulatory Visit | Attending: Radiation Oncology | Admitting: Radiation Oncology

## 2024-10-22 DIAGNOSIS — C3411 Malignant neoplasm of upper lobe, right bronchus or lung: Secondary | ICD-10-CM

## 2024-10-22 LAB — RAD ONC ARIA SESSION SUMMARY
Course Elapsed Days: 8
Plan Fractions Treated to Date: 4
Plan Prescribed Dose Per Fraction: 10 Gy
Plan Total Fractions Prescribed: 5
Plan Total Prescribed Dose: 50 Gy
Reference Point Dosage Given to Date: 40 Gy
Reference Point Session Dosage Given: 10 Gy
Session Number: 4

## 2024-10-23 ENCOUNTER — Encounter: Payer: Self-pay | Admitting: Gastroenterology

## 2024-10-24 ENCOUNTER — Other Ambulatory Visit: Payer: Self-pay

## 2024-10-24 ENCOUNTER — Ambulatory Visit
Admission: RE | Admit: 2024-10-24 | Discharge: 2024-10-24 | Disposition: A | Source: Ambulatory Visit | Attending: Radiation Oncology | Admitting: Radiation Oncology

## 2024-10-24 DIAGNOSIS — C3411 Malignant neoplasm of upper lobe, right bronchus or lung: Secondary | ICD-10-CM | POA: Diagnosis not present

## 2024-10-24 LAB — RAD ONC ARIA SESSION SUMMARY
Course Elapsed Days: 10
Plan Fractions Treated to Date: 5
Plan Prescribed Dose Per Fraction: 10 Gy
Plan Total Fractions Prescribed: 5
Plan Total Prescribed Dose: 50 Gy
Reference Point Dosage Given to Date: 50 Gy
Reference Point Session Dosage Given: 10 Gy
Session Number: 5

## 2024-10-27 NOTE — Radiation Completion Notes (Signed)
 Patient Name: Gina Cherry, Gina Cherry MRN: [id number] Date of Birth: 10/12/1956 Referring Physician: FAHID ALGHANIM, M.D. Date of Service: 2024-10-27 Radiation Oncologist: Signe Nasuti, M.D. [facility]                             RADIATION ONCOLOGY END OF TREATMENT NOTE     Diagnosis: C34.11 Malignant neoplasm of upper lobe, right bronchus or lung Staging on 2024-09-04: Lung cancer (HCC) T=cT1, N=cN0, M=cM0 Intent: Curative     ==========DELIVERED PLANS==========  First Treatment Date: 2024-10-14 Last Treatment Date: 2024-10-24   Plan Name: Lung_RUL_SBRT Site: Lung, Right Technique: SBRT/SRT-IMRT Mode: Photon Dose Per Fraction: 10 Gy Prescribed Dose (Delivered / Prescribed): 50 Gy / 50 Gy Prescribed Fxs (Delivered / Prescribed): 5 / 5   Plan Name: Lung_RLL_SBRT Site: Lung, Right Technique: SBRT/SRT-IMRT Mode: Photon Dose Per Fraction: 18 Gy Prescribed Dose (Delivered / Prescribed): 54 Gy / 54 Gy Prescribed Fxs (Delivered / Prescribed): 3 / 3     ==========ON TREATMENT VISIT DATES========== 2024-10-14, 2024-10-14, 2024-10-17, 2024-10-17, 2024-10-20, 2024-10-22, 2024-10-22, 2024-10-24     ==========UPCOMING VISITS========== 01/08/2025 [facility] SCAN REVIEW Cloretta Arley NOVAK, MD  11/25/2024 [facility] ONC FOLLOW UP 30 Wyatt Leeroy [place], [place]  11/17/2024 [facility] OFFICE NEW PATIENT 20 Mollie Nestor [place]  11/03/2024 [facility] OFFICE VISIT Kriste Emeline BRAVO, DO        ==========APPENDIX - ON TREATMENT VISIT NOTES==========   See weekly On Treatment Notes in Epic for details in the Media tab (listed as Progress notes on the On Treatment Visit Dates listed above).

## 2024-11-03 ENCOUNTER — Ambulatory Visit: Attending: Internal Medicine | Admitting: Internal Medicine

## 2024-11-03 ENCOUNTER — Encounter: Payer: Self-pay | Admitting: Internal Medicine

## 2024-11-03 VITALS — BP 150/80 | HR 98 | Ht 64.0 in | Wt 92.4 lb

## 2024-11-03 DIAGNOSIS — C3411 Malignant neoplasm of upper lobe, right bronchus or lung: Secondary | ICD-10-CM | POA: Diagnosis not present

## 2024-11-03 DIAGNOSIS — I5181 Takotsubo syndrome: Secondary | ICD-10-CM

## 2024-11-03 DIAGNOSIS — E782 Mixed hyperlipidemia: Secondary | ICD-10-CM | POA: Diagnosis not present

## 2024-11-03 DIAGNOSIS — I428 Other cardiomyopathies: Secondary | ICD-10-CM | POA: Diagnosis not present

## 2024-11-03 NOTE — Progress Notes (Signed)
 " Cardiology Office Note:  .   Date:  11/03/2024  ID:  Gina Cherry, DOB 1956/02/11, MRN [id number] PCP: Tobie Gaines, DO  [facility] Providers Cardiologist:  Emeline FORBES Calender, DO    History of Present Illness: .     Discussed the use of AI scribe software for clinical note transcription with the patient, who gave verbal consent to proceed.  History of Present Illness Gina Cherry is a 69 year old female with heart failure and reduced ejection fraction who presents for cardiovascular follow-up.  She has a history of heart failure with reduced ejection fraction, previously managed by Dr. Dann and Dr. Bettyjane. In February 2023, she was admitted to the ICU and treated with broad-spectrum antibiotics. An echocardiogram at that time showed a reduced ejection fraction of 30-35% with hypokinesis of the basal to mid segments of all walls of the left ventricle and apical hyperkinesis concerning for atypical stress cardiomyopathy. There was also a small pericardial effusion without tamponade. She was treated with metoprolol , which she discontinued after a fall in 2025, suspecting a drop in blood pressure or blood sugar as the cause of her fall.  No syncopal episodes known  She has a documented history of non-ischemic cardiomyopathy however no stress testing on file and tobacco abuse. No current chest pain, shortness of breath, or swelling in the legs. She has not been taking medications like Entresto, losartan, lisinopril, metoprolol  or spironolactone .  She also has a history of stage 3A non-small cell lung cancer, specifically squamous cell carcinoma of the right lower and upper lung. She recently completed radiation therapy and is scheduled for a CT scan at the end of March 2026. She feels fine post-treatment.  Regarding her cholesterol, her LDL was 112 a year ago. Her HDL was 66.      ROS: Remaining review of systems negative  Studies Reviewed: .         Results Labs COOX (11/2021): 68% Lipid panel (06/2023): LDL 112, HDL 66  Diagnostic Transthoracic echocardiogram (11/2021): Moderately reduced left ventricular ejection fraction (30-35%), hypokinesis of basal to mid segments of all left ventricular walls, apical hyperkinesis, small pericardial effusion without tamponade Risk Assessment/Calculations:         Physical Exam:   VS:  BP (!) 150/80   Pulse 98   Ht 5' 4 (1.626 m)   Wt 92 lb 6.4 oz (41.9 kg)   SpO2 98%   BMI 15.86 kg/m    Wt Readings from Last 3 Encounters:  11/03/24 92 lb 6.4 oz (41.9 kg)  09/10/24 90 lb 8 oz (41.1 kg)  09/05/24 90 lb (40.8 kg)    GEN: Well nourished, well developed in no acute distress NECK: No JVD;  CARDIAC:  RRR, no murmurs, no rubs, no gallops RESPIRATORY:  Clear to auscultation without rales, wheezing or rhonchi  ABDOMEN: Soft, non-tender, non-distended EXTREMITIES:  No edema; No deformity   ASSESSMENT AND PLAN: .    Assessment and Plan Assessment & Plan Nonischemic cardiomyopathy Documented as such but no ischemic workup on file - Will check an echocardiogram and consider ischemic workup pending results  Heart failure reduced ejection fraction Echocardiogram 11/29/2021 (30-35%) with hypokinesis of basal to mid segments of all LV walls and apical hyperkinesis consistent with atypical stress cardiomyopathy Echocardiogram 12/05/2021 EF 70 to 75% with moderately reduced RV function Previously on metoprolol , patient discontinued due to a fall. - Echocardiogram - Will discuss potential reintroduction of metoprolol  and GDMT based on echocardiogram results.  Hyperlipidemia LDL cholesterol was 112 mg/dL a year ago, above target. HDL cholesterol was 66 mg/dL. Current cholesterol levels need reassessment due to ongoing cancer treatment. - Ordered lipid panel to reassess cholesterol levels. - Will consider starting statin therapy if LDL remains elevated.  Small pericardial effusion Noted on  echo in 2023 -Follow-up echo  Stage IIIa non-small cell lung carcinoma, squamous cell carcinoma of the right lower lung and right upper lung Follows with oncology Was seen by cardiothoracic surgery and was not deemed to be a candidate for surgery and plan is to proceed with SBRT  COPD            Follow up: 1 year or sooner  Signed, Emeline FORBES Calender, DO  11/03/2024 4:33 PM    [facility] "

## 2024-11-03 NOTE — Patient Instructions (Signed)
 Medication Instructions:  Your physician recommends that you continue on your current medications as directed. Please refer to the Current Medication list given to you today.  *If you need a refill on your cardiac medications before your next appointment, please call your pharmacy*  Lab Work: Fasting Lipid Panel at any [facility]   If you have labs (blood work) drawn today and your tests are completely normal, you will receive your results only by: MyChart Message (if you have MyChart) OR A paper copy in the mail If you have any lab test that is abnormal or we need to change your treatment, we will call you to review the results.  Testing/Procedures: Your physician has requested that you have an echocardiogram. Echocardiography is a painless test that uses sound waves to create images of your heart. It provides your doctor with information about the size and shape of your heart and how well your hearts chambers and valves are working. This procedure takes approximately one hour. There are no restrictions for this procedure. Please do NOT wear cologne, perfume, aftershave, or lotions (deodorant is allowed). Please arrive 15 minutes prior to your appointment time.  Please note: We ask at that you not bring children with you during ultrasound (echo/ vascular) testing. Due to room size and safety concerns, children are not allowed in the ultrasound rooms during exams. Our front office staff cannot provide observation of children in our lobby area while testing is being conducted. An adult accompanying a patient to their appointment will only be allowed in the ultrasound room at the discretion of the ultrasound technician under special circumstances. We apologize for any inconvenience.   Follow-Up: At [facility], you and your health needs are our priority.  As part of our continuing mission to provide you with exceptional heart care, our providers are all part of one team.  This team  includes your primary Cardiologist (physician) and Advanced Practice Providers or APPs (Physician Assistants and Nurse Practitioners) who all work together to provide you with the care you need, when you need it.  Your next appointment:   1 year(s)  Provider:   Emeline FORBES Calender, DO    Other Instructions

## 2024-11-17 ENCOUNTER — Ambulatory Visit: Admitting: Gastroenterology

## 2024-11-20 NOTE — Progress Notes (Incomplete)
"  °  Radiation Oncology         [phone] ________________________________  Name: Gina Cherry MRN: [id number]  Date: 11/25/2024  DOB: 08/16/1956  Follow-Up Visit Note  CC: Tobie Gaines, DO  Azyriah Cools, MD  No diagnosis found. 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; s/p SBRT completed on 10/24/2024  Interval Since Last Radiation:  1 month ***  ==========DELIVERED PLANS==========  First Treatment Date: 2024-10-14 Last Treatment Date: 2024-10-24   Plan Name: Lung_RUL_SBRT Site: Lung, Right Technique: SBRT/SRT-IMRT Mode: Photon Dose Per Fraction: 10 Gy Prescribed Dose (Delivered / Prescribed): 50 Gy / 50 Gy Prescribed Fxs (Delivered / Prescribed): 5 / 5   Plan Name: Lung_RLL_SBRT Site: Lung, Right Technique: SBRT/SRT-IMRT Mode: Photon Dose Per Fraction: 18 Gy Prescribed Dose (Delivered / Prescribed): 54 Gy / 54 Gy Prescribed Fxs (Delivered / Prescribed): 3 / 3     Narrative:  The patient returns today for routine follow-up. She completed her treatment approximately 1 month ago without any significant acute side effects.        ***                     ALLERGIES:  is allergic to codeine and codeine.  Meds: Current Outpatient Medications  Medication Sig Dispense Refill   acetaminophen  (TYLENOL ) 325 MG tablet Take 2 tablets (650 mg total) by mouth every 6 (six) hours as needed for mild pain (pain score 1-3) (or Fever >/= 101). 30 tablet 0   alendronate (FOSAMAX) 70 MG tablet Take 70 mg by mouth once a week. Saturday 10/20/23 last dose.     Multiple Vitamin (MULTIVITAMIN WITH MINERALS) TABS tablet Take 1 tablet by mouth daily. 30 tablet 0   PREVIDENT 5000 BOOSTER PLUS 1.1 % PSTE Place onto teeth as directed.     No current facility-administered medications for this visit.    Physical Findings:  vitals were not taken for this visit. .   The patient is in no acute distress. Patient  is alert and oriented. No significant changes. Lungs are clear to auscultation bilaterally. Heart has regular rate and rhythm. No palpable cervical, supraclavicular, or axillary adenopathy. Abdomen soft, non-tender, normal bowel sounds. ***  Lab Findings: Lab Results  Component Value Date   WBC 10.1 08/11/2024   HGB 15.9 (H) 08/11/2024   HCT 44.4 08/11/2024   MCV 104.5 (H) 08/11/2024   PLT 287 08/11/2024    Radiographic Findings: No results found.  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 Per NCCN surveillance guidelines, we will proceed with CT of the chest in 3 months, followed by serial CT of the chest every 6 months for the first 5 years after treatment.    CT of the chest ordered to be completed in 3 months. We will see the patient after her scan for physical examination and to review the results. She was encouraged to call us  with any questions or concerns in the meantime. ***   I personally spent *** minutes in this encounter including chart review, reviewing radiological studies, meeting face-to-face with the patient, entering orders and completing documentation. ____________________________________    Leeroy Due, PA-C   "

## 2024-11-25 ENCOUNTER — Ambulatory Visit: Admitting: Radiology

## 2024-11-27 ENCOUNTER — Other Ambulatory Visit (HOSPITAL_BASED_OUTPATIENT_CLINIC_OR_DEPARTMENT_OTHER)

## 2024-12-11 ENCOUNTER — Ambulatory Visit: Admitting: Gastroenterology

## 2025-01-08 ENCOUNTER — Inpatient Hospital Stay: Admitting: Oncology

## 2025-03-03 ENCOUNTER — Ambulatory Visit (HOSPITAL_BASED_OUTPATIENT_CLINIC_OR_DEPARTMENT_OTHER): Admitting: Pulmonary Disease
